# Patient Record
Sex: Male | Born: 1975
Health system: Southern US, Community
[De-identification: ages and names within clinical notes are randomized; demographics above are authoritative.]

## PROBLEM LIST (undated history)

## (undated) DIAGNOSIS — G4730 Sleep apnea, unspecified: Secondary | ICD-10-CM

## (undated) DIAGNOSIS — G4763 Sleep related bruxism: Secondary | ICD-10-CM

## (undated) DIAGNOSIS — J302 Other seasonal allergic rhinitis: Secondary | ICD-10-CM

## (undated) DIAGNOSIS — K219 Gastro-esophageal reflux disease without esophagitis: Secondary | ICD-10-CM

## (undated) DIAGNOSIS — F419 Anxiety disorder, unspecified: Secondary | ICD-10-CM

## (undated) HISTORY — DX: Sleep apnea, unspecified: G47.30

## (undated) HISTORY — DX: Sleep related bruxism: G47.63

## (undated) HISTORY — DX: Other seasonal allergic rhinitis: J30.2

## (undated) HISTORY — DX: Gastro-esophageal reflux disease without esophagitis: K21.9

---

## 2002-03-25 ENCOUNTER — Emergency Department (HOSPITAL_COMMUNITY): Admission: EM | Admit: 2002-03-25 | Discharge: 2002-03-25 | Payer: Self-pay | Admitting: Emergency Medicine

## 2002-04-05 ENCOUNTER — Encounter: Payer: Self-pay | Admitting: Emergency Medicine

## 2002-04-05 ENCOUNTER — Emergency Department (HOSPITAL_COMMUNITY): Admission: EM | Admit: 2002-04-05 | Discharge: 2002-04-05 | Payer: Self-pay | Admitting: Emergency Medicine

## 2002-06-28 ENCOUNTER — Emergency Department (HOSPITAL_COMMUNITY): Admission: EM | Admit: 2002-06-28 | Discharge: 2002-06-28 | Payer: Self-pay | Admitting: Emergency Medicine

## 2002-06-29 ENCOUNTER — Encounter: Payer: Self-pay | Admitting: Emergency Medicine

## 2002-07-03 ENCOUNTER — Ambulatory Visit (HOSPITAL_COMMUNITY): Admission: RE | Admit: 2002-07-03 | Discharge: 2002-07-03 | Payer: Self-pay | Admitting: *Deleted

## 2002-07-03 ENCOUNTER — Encounter: Payer: Self-pay | Admitting: Emergency Medicine

## 2006-11-11 ENCOUNTER — Ambulatory Visit: Payer: Self-pay | Admitting: Pulmonary Disease

## 2006-12-04 ENCOUNTER — Ambulatory Visit (HOSPITAL_BASED_OUTPATIENT_CLINIC_OR_DEPARTMENT_OTHER): Admission: RE | Admit: 2006-12-04 | Discharge: 2006-12-04 | Payer: Self-pay | Admitting: Pulmonary Disease

## 2006-12-06 ENCOUNTER — Ambulatory Visit: Payer: Self-pay | Admitting: Pulmonary Disease

## 2006-12-20 ENCOUNTER — Ambulatory Visit: Payer: Self-pay | Admitting: Pulmonary Disease

## 2013-08-22 ENCOUNTER — Emergency Department (HOSPITAL_BASED_OUTPATIENT_CLINIC_OR_DEPARTMENT_OTHER)
Admission: EM | Admit: 2013-08-22 | Discharge: 2013-08-22 | Disposition: A | Discharge status: Home / Self Care | Payer: 59 | Attending: Emergency Medicine | Admitting: Emergency Medicine

## 2013-08-22 ENCOUNTER — Encounter (HOSPITAL_BASED_OUTPATIENT_CLINIC_OR_DEPARTMENT_OTHER): Payer: Self-pay | Admitting: Emergency Medicine

## 2013-08-22 DIAGNOSIS — B349 Viral infection, unspecified: Secondary | ICD-10-CM

## 2013-08-22 DIAGNOSIS — B9789 Other viral agents as the cause of diseases classified elsewhere: Secondary | ICD-10-CM | POA: Insufficient documentation

## 2013-08-22 DIAGNOSIS — Z880 Allergy status to penicillin: Secondary | ICD-10-CM | POA: Insufficient documentation

## 2013-08-22 DIAGNOSIS — Z79899 Other long term (current) drug therapy: Secondary | ICD-10-CM | POA: Insufficient documentation

## 2013-08-22 DIAGNOSIS — R1084 Generalized abdominal pain: Secondary | ICD-10-CM | POA: Insufficient documentation

## 2013-08-22 DIAGNOSIS — R197 Diarrhea, unspecified: Secondary | ICD-10-CM | POA: Insufficient documentation

## 2013-08-22 MED ORDER — DIPHENHYDRAMINE HCL 50 MG/ML IJ SOLN
INTRAMUSCULAR | Status: AC
  Filled 2013-08-22: qty 1

## 2013-08-22 MED ORDER — DIPHENHYDRAMINE HCL 50 MG/ML IJ SOLN
25.0000 mg | Freq: Once | INTRAMUSCULAR | Status: AC
  Administered 2013-08-22: 25 mg via INTRAMUSCULAR

## 2013-08-22 MED ORDER — PROMETHAZINE HCL 25 MG PO TABS: 25.0000 mg | ORAL_TABLET | Freq: Four times a day (QID) | ORAL | Status: DC | PRN

## 2013-08-22 MED ORDER — DIPHENOXYLATE-ATROPINE 2.5-0.025 MG PO TABS: 1.0000 | ORAL_TABLET | Freq: Four times a day (QID) | ORAL | Status: DC | PRN

## 2013-08-22 MED ORDER — PROMETHAZINE HCL 25 MG RE SUPP: 25.0000 mg | Freq: Four times a day (QID) | RECTAL | Status: DC | PRN

## 2013-08-22 MED ORDER — DIPHENOXYLATE-ATROPINE 2.5-0.025 MG PO TABS
2.0000 | ORAL_TABLET | Freq: Once | ORAL | Status: AC
  Administered 2013-08-22: 2 via ORAL
  Filled 2013-08-22: qty 2

## 2013-08-22 MED ORDER — ONDANSETRON 4 MG PO TBDP
4.0000 mg | ORAL_TABLET | Freq: Once | ORAL | Status: AC
  Administered 2013-08-22: 4 mg via ORAL
  Filled 2013-08-22: qty 1

## 2013-08-22 MED ORDER — PROMETHAZINE HCL 25 MG/ML IJ SOLN
25.0000 mg | Freq: Once | INTRAMUSCULAR | Status: AC
  Administered 2013-08-22: 25 mg via INTRAMUSCULAR
  Filled 2013-08-22: qty 1

## 2013-08-22 NOTE — ED Provider Notes (Signed)
CSN: [id number]     Arrival date & time 08/22/13  1926 History   First MD Initiated Contact with Patient 08/22/13 2001     Chief Complaint  Patient presents with  . Emesis   (Consider location/radiation/quality/duration/timing/severity/associated sxs/prior Treatment) Patient is a 37 y.o. male presenting with vomiting and diarrhea. The history is provided by the patient. No language interpreter was used.  Emesis Severity:  Moderate Duration:  2 hours Timing:  Intermittent Number of daily episodes:  4 Quality:  Stomach contents How soon after eating does vomiting occur:  10 minutes Progression:  Unchanged Chronicity:  New Recent urination:  Normal Context: not post-tussive and not self-induced   Relieved by:  Nothing Worsened by:  Nothing tried Ineffective treatments:  None tried Associated symptoms: abdominal pain and diarrhea   Associated symptoms: no arthralgias and no chills   Abdominal pain:    Location:  Generalized   Quality:  Cramping   Severity:  Moderate   Onset quality:  Gradual   Duration:  2 hours   Timing:  Constant   Progression:  Unchanged   Chronicity:  New Diarrhea:    Quality:  Watery   Number of occurrences:  2   Severity:  Moderate   Duration:  2 hours   Timing:  Intermittent   Progression:  Unchanged Risk factors: no alcohol use, no diabetes, no sick contacts, no suspect food intake and no travel to endemic areas   Diarrhea Associated symptoms: abdominal pain and vomiting   Associated symptoms: no arthralgias, no chills and no fever     History reviewed. No pertinent past medical history. History reviewed. No pertinent past surgical history. No family history on file. History  Substance Use Topics  . Smoking status: Never Smoker   . Smokeless tobacco: Not on file  . Alcohol Use: No    Review of Systems  Constitutional: Negative for fever, chills and fatigue.  HENT: Negative for trouble swallowing.   Eyes: Negative for visual  disturbance.  Respiratory: Negative for shortness of breath.   Cardiovascular: Negative for chest pain and palpitations.  Gastrointestinal: Positive for nausea, vomiting, abdominal pain and diarrhea.  Genitourinary: Negative for dysuria and difficulty urinating.  Musculoskeletal: Negative for arthralgias and neck pain.  Skin: Negative for color change.  Neurological: Negative for dizziness and weakness.  Psychiatric/Behavioral: Negative for dysphoric mood.    Allergies  Penicillins  Home Medications   Current Outpatient Rx  Name  Route  Sig  Dispense  Refill  . modafinil (PROVIGIL) 200 MG tablet   Oral   Take 200 mg by mouth daily.          BP 152/87  Pulse 95  Temp(Src) 98.1 F (36.7 C) (Oral)  Resp 18  Ht 5\' 6"  (1.676 m)  Wt 240 lb (108.863 kg)  BMI 38.76 kg/m2  SpO2 98% Physical Exam  Nursing note and vitals reviewed. Constitutional: He is oriented to person, place, and time. He appears well-developed and well-nourished. No distress.  HENT:  Head: Normocephalic and atraumatic.  Eyes: Conjunctivae and EOM are normal.  Neck: Normal range of motion.  Cardiovascular: Normal rate and regular rhythm.  Exam reveals no gallop and no friction rub.   No murmur heard. Pulmonary/Chest: Effort normal and breath sounds normal. He has no wheezes. He has no rales. He exhibits no tenderness.  Abdominal: Soft. He exhibits no distension. There is no tenderness. There is no rebound and no guarding.  Musculoskeletal: Normal range of motion.  Neurological: He is  alert and oriented to person, place, and time. Coordination normal.  Speech is goal-oriented. Moves limbs without ataxia.   Skin: Skin is warm and dry.  Psychiatric: He has a normal mood and affect. His behavior is normal.    ED Course  Procedures (including critical care time) Labs Review Labs Reviewed - No data to display Imaging Review No results found.  EKG Interpretation   None       MDM   1. Viral illness      9:38 PM Patient was given ODT zofran on arrival and now experiencing hives. Patient will have IM benadryl, IM phenergan and lomotil. Patient does not want to have an IV. Patient will be discharged with symptomatic relief. Vitals stable and patient afebrile. Patient denies throat closing or SOB.    Emilia Beck, PA-C 08/23/13 2351

## 2013-08-22 NOTE — ED Notes (Signed)
Pt c/o sudden onset of vomiting and diarrhea apprx. 1 hour ago. Pt reports vomited x4 and diarrhea x2.

## 2013-08-24 NOTE — ED Provider Notes (Signed)
Medical screening examination/treatment/procedure(s) were performed by non-physician practitioner and as supervising physician I was immediately available for consultation/collaboration.  EKG Interpretation   None         Dagmar Hait, MD 08/24/13 [phone]

## 2013-11-11 ENCOUNTER — Ambulatory Visit (INDEPENDENT_AMBULATORY_CARE_PROVIDER_SITE_OTHER): Payer: 59 | Admitting: Pulmonary Disease

## 2013-11-11 ENCOUNTER — Encounter: Payer: Self-pay | Admitting: Pulmonary Disease

## 2013-11-11 ENCOUNTER — Encounter (INDEPENDENT_AMBULATORY_CARE_PROVIDER_SITE_OTHER): Payer: Self-pay

## 2013-11-11 VITALS — BP 124/82 | HR 87 | Temp 98.2°F | Ht 66.5 in | Wt 264.2 lb

## 2013-11-11 DIAGNOSIS — G4733 Obstructive sleep apnea (adult) (pediatric): Secondary | ICD-10-CM

## 2013-11-11 NOTE — Progress Notes (Signed)
Subjective:    Patient ID: Bruce Simmons, male    DOB: 09-06-1975, 38 y.o.   MRN: [id number]  HPI The patient is a 38 year old male who comes in today as a self-referral for management of obstructive sleep apnea. I have seen him in the distant past, and he has known very severe OSA, with an AHI 115 events per hour in 2008. The patient was started on CPAP with definite improvement in his sleep and daytime alertness, but his alertness during the day never normalized. The patient never followed up with me over time, but was started on Provigil by his primary physician which worked very well with the CPAP. He has not kept up with his mask changes or supplies, and has not been able to wear CPAP for quite some time because of this. He is also gained 40 pounds since his initial study in 2008. He is noting nonrestorative sleep with frequent awakenings, and severe sleepiness during the day even with Provigil. His Epworth score today is 15.   Sleep Questionnaire What time do you typically go to bed?( Between what hours) 9-10pm 9-10pm at 1202 on 11/11/13 by Maurice March, RN How long does it take you to fall asleep? 10-30 minutes 10-30 minutes at 1202 on 11/11/13 by Maurice March, RN How many times during the night do you wake up? 4 4 at 1202 on 11/11/13 by Maurice March, RN What time do you get out of bed to start your day? 0530 0530 at 1202 on 11/11/13 by Maurice March, RN Do you drive or operate heavy machinery in your occupation? Yes Yes at 1202 on 11/11/13 by Maurice March, RN How much has your weight changed (up or down) over the past two years? (In pounds) 20 lb (9.072 kg) 20 lb (9.072 kg) at 1202 on 11/11/13 by Maurice March, RN Have you ever had a sleep study before? Yes Yes at 1202 on 11/11/13 by Maurice March, RN If yes, location of study? [facility] at 1202 on 11/11/13 by Maurice March, RN If yes, date of study? 2008 2008 at 1202 on 11/11/13 by Maurice March, RN  Do you currently use CPAP? YesYes intermittenly at 1202 on 11/11/13 by Maurice March, RN If so, what pressure? 17 17 at 1202 on 11/11/13 by Maurice March, RN Do you wear oxygen at any time? No No at 1202 on 11/11/13 by Maurice March, RN   Review of Systems  Constitutional: Negative for fever and unexpected weight change.  HENT: Negative for congestion, dental problem, ear pain, nosebleeds, postnasal drip, rhinorrhea, sinus pressure, sneezing, sore throat and trouble swallowing.   Eyes: Negative for redness and itching.  Respiratory: Negative for cough, chest tightness, shortness of breath and wheezing.   Cardiovascular: Positive for palpitations and leg swelling.  Gastrointestinal: Negative for nausea and vomiting.  Genitourinary: Negative for dysuria.  Musculoskeletal: Negative for joint swelling.  Skin: Negative for rash.  Neurological: Negative for headaches.  Hematological: Does not bruise/bleed easily.  Psychiatric/Behavioral: Negative for dysphoric mood. The patient is not nervous/anxious.        Objective:   Physical Exam Constitutional:  Obese male, no acute distress  HENT:  Nares patent without discharge  Oropharynx without exudate, palate and uvula are elongated, mild tonsillar hypertrophy  Eyes:  Perrla, eomi, no scleral icterus  Neck:  No JVD, no TMG  Cardiovascular:  Normal rate, regular rhythm, no rubs or gallops.  No  murmurs        Intact distal pulses  Pulmonary :  Normal breath sounds, no stridor or respiratory distress   No rales, rhonchi, or wheezing  Abdominal:  Soft, nondistended, bowel sounds present.  No tenderness noted.   Musculoskeletal:  slight lower extremity edema noted.  Lymph Nodes:  No cervical lymphadenopathy noted  Skin:  No cyanosis noted  Neurologic:  Appears sleepy but appropriate, moves all 4 extremities without obvious deficit.         Assessment & Plan:

## 2013-11-11 NOTE — Assessment & Plan Note (Signed)
The patient has very severe obstructive sleep apnea in the past, and has gained another 40 pounds of weight. He currently is not able to use CPAP because of a mask that is falling apart, and old supplies. At this point, we need to make sure that his machine is working properly, and will also get him new supplies.  We will also need to optimize his pressure again on an automatic device since he has gained weight. I have pulled the patient that he is probably in the subset of patients that have persistent sleepiness even with adequate CPAP usage. We normally treat these patients with stimulant medication such as Provigil, but I also explained this usually resolves with weight loss. The patient has had very high pressure needs, and I've explained that we are reaching the limit of any positive pressure device. Therefore, I stressed to him the importance of aggressive weight loss.

## 2013-11-11 NOTE — Patient Instructions (Signed)
Will have your machine checked to make sure is in working order. Will get the auto device for another 2-3 weeks to re-optimize your pressure. Need new mask and supplies. Work on weight loss.  Will call you once I receive your download on the auto, and will also arrange a followup visit.

## 2013-11-12 ENCOUNTER — Other Ambulatory Visit: Payer: Self-pay | Admitting: Occupational Medicine

## 2013-11-12 ENCOUNTER — Ambulatory Visit: Payer: Self-pay

## 2013-11-12 DIAGNOSIS — R52 Pain, unspecified: Secondary | ICD-10-CM

## 2013-11-17 ENCOUNTER — Telehealth: Payer: Self-pay | Admitting: Pulmonary Disease

## 2013-11-17 NOTE — Telephone Encounter (Signed)
lmtcb x1 for pt. 

## 2013-11-18 NOTE — Telephone Encounter (Signed)
Pt returning call.Bruce Simmons ° °

## 2013-11-18 NOTE — Telephone Encounter (Signed)
Per order sent by [facility] at the 3.11.15 ov: Comments     Needs to re-establish with DME. Wants someone in [place] 1) check his cpap; machine with manometer, and make sure working properly. 2) will use auto 5-22cm for 2-3 weeks, with download to me 3) needs to be fitted for new full face mask and supplies. Show him different kinds. See if this can be done ASAP.   Called spoke with patient to inform him that [facility] has been trying to get in touch with him regarding his CPAP machine as discussed with [place] at the last ov.  Pt stated that he was aware, has been trying to contact them as well and actually spoke with [facility] earlier this afternoon and has an appt with them.  Called [facility] spoke with Corene Cornea and verified that pt has an upcoming appt on 3.20.15 for CPAP check and mask fitting.  Nothing further needed at this time; will sign off.

## 2013-11-18 NOTE — Telephone Encounter (Signed)
lmomtcb for pt  Corene Cornea with [facility] is aware we have also atc pt and have lmomtcb

## 2013-11-26 ENCOUNTER — Ambulatory Visit: Payer: PRIVATE HEALTH INSURANCE | Admitting: Physical Therapy

## 2014-01-01 ENCOUNTER — Telehealth: Payer: Self-pay | Admitting: Pulmonary Disease

## 2014-01-01 NOTE — Telephone Encounter (Signed)
Byron directed me to an accordion folder that [place] keeps all downloads and I do not see it in there.  I'm not sure if she would keep it anywhere else.

## 2014-01-01 NOTE — Telephone Encounter (Signed)
Called and spoke with [facility] and they are faxing the download to the fax machine up front.  i have called and lmom to make the pt aware.  Will forward back to [place] to keep a look out for the download to come through.  thanks

## 2014-01-01 NOTE — Telephone Encounter (Signed)
Caryl Pina can you see if [facility] received the download for this pt.  thanks

## 2014-01-08 NOTE — Telephone Encounter (Signed)
Anderson Malta have your received these in [facility] papers?  Thanks.

## 2014-01-12 NOTE — Telephone Encounter (Signed)
I looked through all paperwork and do not see this download. I called [facility] again and asked that they fax to triage fax to my attention. [place] Bing, CMA

## 2014-01-12 NOTE — Telephone Encounter (Signed)
I received the download and placed in [facility] green look-at folder to review. [place] Bing, CMA

## 2014-01-14 NOTE — Telephone Encounter (Signed)
I asked for the to be put on auto 5-22 for 2-3 weeks with download to me.  The download they sent is on a fixed pressure of 17!!  What gives?  Let pt know the dme screwed up, and then call dme and ask them to do what the order says!!

## 2014-01-14 NOTE — Telephone Encounter (Signed)
Spoke with pt and advised that we are checking with [facility] to see why download was done on fixed 17 and not auto 5-22.  Advised pt that we will contact as soon as we hear back from [facility].  Await call from [facility] at [facility]

## 2014-01-14 NOTE — Telephone Encounter (Signed)
Spoke with Melissa from [facility].  She is going to look into this and call us back.  (download is in triage) [place] x 1 for pt to update him

## 2014-01-15 NOTE — Telephone Encounter (Signed)
Received call from [facility] with [facility]. Melissa stated she was unsure why the data collected was for a fixed pressure but is going to re-due the auto for two weeks and send in the download when correct data has been collected. Nothing further needed at this time.

## 2014-01-15 NOTE — Telephone Encounter (Signed)
[facility] did do pt's auto-titration, but are unable to retrieve the results.  [facility] will complete another auto-titration & get results in ASAP.  Satira Anis

## 2014-01-15 NOTE — Telephone Encounter (Signed)
lmomtcb x1 for melissa 

## 2014-02-02 ENCOUNTER — Ambulatory Visit: Payer: PRIVATE HEALTH INSURANCE | Attending: Physician Assistant | Admitting: Physical Therapy

## 2014-02-02 DIAGNOSIS — M545 Low back pain, unspecified: Secondary | ICD-10-CM | POA: Insufficient documentation

## 2014-02-02 DIAGNOSIS — IMO0001 Reserved for inherently not codable concepts without codable children: Secondary | ICD-10-CM | POA: Diagnosis present

## 2014-02-04 ENCOUNTER — Ambulatory Visit: Payer: PRIVATE HEALTH INSURANCE | Admitting: Physical Therapy

## 2014-02-04 DIAGNOSIS — IMO0001 Reserved for inherently not codable concepts without codable children: Secondary | ICD-10-CM | POA: Diagnosis not present

## 2014-02-10 ENCOUNTER — Ambulatory Visit: Payer: PRIVATE HEALTH INSURANCE

## 2014-02-10 DIAGNOSIS — IMO0001 Reserved for inherently not codable concepts without codable children: Secondary | ICD-10-CM | POA: Diagnosis not present

## 2014-02-15 ENCOUNTER — Ambulatory Visit: Payer: PRIVATE HEALTH INSURANCE | Admitting: Rehabilitation

## 2014-02-23 ENCOUNTER — Encounter: Payer: PRIVATE HEALTH INSURANCE | Admitting: Physical Therapy

## 2014-02-25 ENCOUNTER — Ambulatory Visit: Payer: PRIVATE HEALTH INSURANCE | Admitting: Rehabilitation

## 2014-02-25 DIAGNOSIS — IMO0001 Reserved for inherently not codable concepts without codable children: Secondary | ICD-10-CM | POA: Diagnosis not present

## 2014-03-05 ENCOUNTER — Encounter (HOSPITAL_COMMUNITY): Payer: Self-pay | Admitting: Emergency Medicine

## 2014-03-05 ENCOUNTER — Emergency Department (HOSPITAL_COMMUNITY)
Admission: EM | Admit: 2014-03-05 | Discharge: 2014-03-05 | Disposition: A | Discharge status: Home / Self Care | Payer: 59 | Source: Home / Self Care | Attending: Family Medicine | Admitting: Family Medicine

## 2014-03-05 DIAGNOSIS — L259 Unspecified contact dermatitis, unspecified cause: Secondary | ICD-10-CM

## 2014-03-05 DIAGNOSIS — L309 Dermatitis, unspecified: Secondary | ICD-10-CM

## 2014-03-05 LAB — GLUCOSE, CAPILLARY: GLUCOSE-CAPILLARY: 103 mg/dL — AB (ref 70–99)

## 2014-03-05 MED ORDER — TERBINAFINE HCL 250 MG PO TABS: 250.0000 mg | ORAL_TABLET | Freq: Every day | ORAL | Status: DC

## 2014-03-05 MED ORDER — PREDNISONE 10 MG PO TABS: ORAL_TABLET | ORAL | Status: DC

## 2014-03-05 NOTE — ED Notes (Signed)
Pt  Has  A  Rash  Behind  Both legs     That  He  Noticed perhaps  1  Week  Ago  - he  denys  Any  Known  Causative  Agents        He  Appears  In no  Acute  Distress      Takes  No  meds

## 2014-03-05 NOTE — Discharge Instructions (Signed)
Eczema Eczema, also called atopic dermatitis, is a skin disorder that causes inflammation of the skin. It causes a red rash and dry, scaly skin. The skin becomes very itchy. Eczema is generally worse during the cooler winter months and often improves with the warmth of summer. Eczema usually starts showing signs in infancy. Some children outgrow eczema, but it may last through adulthood.  CAUSES  The exact cause of eczema is not known, but it appears to run in families. People with eczema often have a family history of eczema, allergies, asthma, or hay fever. Eczema is not contagious. Flare-ups of the condition may be caused by:   Contact with something you are sensitive or allergic to.   Stress. SIGNS AND SYMPTOMS  Dry, scaly skin.   Red, itchy rash.   Itchiness. This may occur before the skin rash and may be very intense.  DIAGNOSIS  The diagnosis of eczema is usually made based on symptoms and medical history. TREATMENT  Eczema cannot be cured, but symptoms usually can be controlled with treatment and other strategies. A treatment plan might include:  Controlling the itching and scratching.   Use over-the-counter antihistamines as directed for itching. This is especially useful at night when the itching tends to be worse.   Use over-the-counter steroid creams as directed for itching.   Avoid scratching. Scratching makes the rash and itching worse. It may also result in a skin infection (impetigo) due to a break in the skin caused by scratching.   Keeping the skin well moisturized with creams every day. This will seal in moisture and help prevent dryness. Lotions that contain alcohol and water should be avoided because they can dry the skin.   Limiting exposure to things that you are sensitive or allergic to (allergens).   Recognizing situations that cause stress.   Developing a plan to manage stress.  HOME CARE INSTRUCTIONS   Only take over-the-counter or  prescription medicines as directed by your health care provider.   Do not use anything on the skin without checking with your health care provider.   Keep baths or showers short (5 minutes) in warm (not hot) water. Use mild cleansers for bathing. These should be unscented. You may add nonperfumed bath oil to the bath water. It is best to avoid soap and bubble bath.   Immediately after a bath or shower, when the skin is still damp, apply a moisturizing ointment to the entire body. This ointment should be a petroleum ointment. This will seal in moisture and help prevent dryness. The thicker the ointment, the better. These should be unscented.   Keep fingernails cut short. Children with eczema may need to wear soft gloves or mittens at night after applying an ointment.   Dress in clothes made of cotton or cotton blends. Dress lightly, because heat increases itching.   A child with eczema should stay away from anyone with fever blisters or cold sores. The virus that causes fever blisters (herpes simplex) can cause a serious skin infection in children with eczema. SEEK MEDICAL CARE IF:   Your itching interferes with sleep.   Your rash gets worse or is not better within 1 week after starting treatment.   You see pus or soft yellow scabs in the rash area.   You have a fever.   You have a rash flare-up after contact with someone who has fever blisters.  Document Released: 08/17/2000 Document Revised: 06/10/2013 Document Reviewed: 03/23/2013 ExitCare Patient Information 2015 [employer]. This information   is not intended to replace advice given to you by your health care provider. Make sure you discuss any questions you have with your health care provider.  

## 2014-03-05 NOTE — ED Provider Notes (Signed)
Medical screening examination/treatment/procedure(s) were performed by resident physician or non-physician practitioner and as supervising physician I was immediately available for consultation/collaboration.   Pauline Good MD.   Billy Fischer, MD 03/05/14 [phone]

## 2014-03-05 NOTE — ED Provider Notes (Signed)
CSN: [id number]     Arrival date & time 03/05/14  1024 History   First MD Initiated Contact with Patient 03/05/14 1041     No chief complaint on file.  (Consider location/radiation/quality/duration/timing/severity/associated sxs/prior Treatment) Patient is a 38 y.o. male presenting with rash. The history is provided by the patient.  Rash Location:  Full body Quality: itchiness   Severity:  Mild Onset quality:  Gradual Timing:  Constant Progression:  Worsening Chronicity:  New Context: not sick contacts   Relieved by:  Nothing Worsened by:  Nothing tried Ineffective treatments:  None tried Associated symptoms: no abdominal pain     Past Medical History  Diagnosis Date  . Sleep apnea   . Seasonal allergies    No past surgical history on file. Family History  Problem Relation Age of Onset  . Diabetes Father   . Hypertension Father   . Hyperlipidemia Father   . Atrial fibrillation Father   . Stroke Mother   . Hypertension Mother   . Thyroid cancer Mother    History  Substance Use Topics  . Smoking status: Former Smoker -- 0.25 packs/day for 3 years    Types: Cigarettes    Quit date: 09/04/2003  . Smokeless tobacco: Not on file  . Alcohol Use: Yes     Comment: 12 pack/week    Review of Systems  Gastrointestinal: Negative for abdominal pain.  Skin: Positive for rash.  All other systems reviewed and are negative.   Allergies  Penicillins and Zofran  Home Medications   Prior to Admission medications   Medication Sig Start Date End Date Taking? Authorizing Provider  modafinil (PROVIGIL) 200 MG tablet Take 200 mg by mouth daily.    Historical Provider, MD  omeprazole (PRILOSEC) 20 MG capsule Take 20 mg by mouth daily as needed.    Historical Provider, MD   BP 133/76  Pulse 88  Temp(Src) 99 F (37.2 C) (Oral)  Resp 16  SpO2 100% Physical Exam  Nursing note and vitals reviewed. Constitutional: He appears well-developed and well-nourished.  HENT:  Head:  Normocephalic.  Abdominal: He exhibits no distension.  Musculoskeletal: Normal range of motion.  Skin: Rash noted. There is erythema.  Red raised areas arms and legs,  Look fungal,   Chest  eczema  Psychiatric: He has a normal mood and affect.    ED Course  Procedures (including critical care time) Labs Review Labs Reviewed - No data to display  Imaging Review No results found.   MDM  No diagnosis found. Pt counseled advised to follow up with Dr. Rolm Bookbinder for derm evaluation   Fransico Meadow, PA-C 03/05/14 1100

## 2014-03-15 ENCOUNTER — Encounter: Payer: Self-pay | Admitting: Rehabilitation

## 2014-03-15 ENCOUNTER — Ambulatory Visit: Payer: PRIVATE HEALTH INSURANCE | Attending: Physician Assistant | Admitting: Physical Therapy

## 2014-03-15 DIAGNOSIS — IMO0001 Reserved for inherently not codable concepts without codable children: Secondary | ICD-10-CM | POA: Insufficient documentation

## 2014-03-15 DIAGNOSIS — M545 Low back pain, unspecified: Secondary | ICD-10-CM | POA: Insufficient documentation

## 2014-03-18 ENCOUNTER — Encounter: Payer: PRIVATE HEALTH INSURANCE | Admitting: Rehabilitation

## 2014-03-22 ENCOUNTER — Encounter: Payer: PRIVATE HEALTH INSURANCE | Admitting: Physical Therapy

## 2014-03-23 ENCOUNTER — Telehealth: Payer: Self-pay | Admitting: Pulmonary Disease

## 2014-03-23 NOTE — Telephone Encounter (Signed)
My Chart Message Copied Below:    Appointment Request From: Alfred Levins  With Provider: Kathee Delton, MD [facility]]  Preferred Date Range: From 03/15/2014 To 03/19/2014  Preferred Times: Monday Morning, Tuesday Morning, Thursday Morning  Reason: To address the following health maintenance concerns. Tetanus/Tdap  Comments:

## 2014-03-23 NOTE — Telephone Encounter (Signed)
I called spoke with pt. Made him aware those vaccines needs to come form PCP. Nothing further needed

## 2014-03-25 ENCOUNTER — Encounter: Payer: PRIVATE HEALTH INSURANCE | Admitting: Rehabilitation

## 2014-04-12 ENCOUNTER — Telehealth: Payer: Self-pay | Admitting: Pulmonary Disease

## 2014-04-12 NOTE — Telephone Encounter (Signed)
Called and spoke with pt and he is requesting the results of the titration study.  He stated that the first one that he did was not readable and he stated that he did it again for 2 weeks and has not heard anything.  [place] please advise. thanks

## 2014-04-13 NOTE — Telephone Encounter (Signed)
Is this an auto titration on cpap.  If so, need download from dme or from [place].

## 2014-04-13 NOTE — Telephone Encounter (Signed)
I have sent Melissa a staff message letting her know download is needed off machine and to fax to triage.  Pt is aware we are awaiting download.

## 2014-04-14 NOTE — Telephone Encounter (Signed)
I have sent another staff message to [facility] checking on this.

## 2014-04-14 NOTE — Telephone Encounter (Signed)
Download received and placed in KC's look. Please advise thanks

## 2014-04-14 NOTE — Telephone Encounter (Signed)
Corene Cornea w/ [facility] calling back.  Requesting call back.  [phone]

## 2014-04-14 NOTE — Telephone Encounter (Signed)
Called pt. Made him aware we are still working in this. He reports his machine is not capable of auto. Called [place] w/ [facility]. Pt was giving a loaner auto CPAP machine. When they tried to get the 1st auto download results it would not download anything. Pt was then given another loaner in May. Pt returned device 03/12/14. He is going to check on this and see where this download is. Will await call back

## 2014-04-14 NOTE — Telephone Encounter (Signed)
Bruce Simmons w/ [facility] states that per their respiratory support department, a download from 01/27/14 to 03/12/14 obtained no information.  Will need to repeat this procedure.  Unsure if a new order to start this will be needed.  Bruce Simmons will call back if able to start w/out the new order.  Bruce Simmons can be reached at [phone].  Satira Anis

## 2014-04-14 NOTE — Telephone Encounter (Signed)
Per Corene Cornea, there is no data on card from CPAP Titration done 01/27/14-03/12/14 Test will need to be repeated--verbal order given--they will use old order to process.  Pt aware that [facility] will be contacting him to set this up.  Pt is going to contact [facility] billing dept to inquire if there are any additional costs for this repeat as the repeat if not his fault.   Will send to Dr Gwenette Greet as Juluis Rainier.

## 2014-04-14 NOTE — Telephone Encounter (Signed)
I ordered an auto titration device for 2-3 weeks back in march to optimize his pressure.  Never got this information. The current download on my desk is on a fixed pressure.  They either need to produce the 2-3 week download on auto from march, or we will have to start over again.  See my orders from march visit

## 2014-04-14 NOTE — Telephone Encounter (Signed)
lmtcb x1 

## 2014-11-13 ENCOUNTER — Telehealth: Payer: Self-pay | Admitting: Nurse Practitioner

## 2014-11-13 DIAGNOSIS — H109 Unspecified conjunctivitis: Secondary | ICD-10-CM

## 2014-11-13 NOTE — Progress Notes (Signed)
Continue curent erythromycin drops- should start improving  Today- if worsen by tomorrow resubmit e visit.

## 2014-11-19 ENCOUNTER — Ambulatory Visit: Payer: Self-pay | Admitting: Pulmonary Disease

## 2015-02-07 ENCOUNTER — Telehealth: Payer: Self-pay | Admitting: Pulmonary Disease

## 2015-02-07 NOTE — Telephone Encounter (Signed)
Went out to speak with Bruce Simmons. Per [name] he received a call from [facility] in regards to the order for his CPAP supplies he was here requesting. He was scheduled for a f/u appt. Nothing further needed at this time.

## 2015-04-07 ENCOUNTER — Telehealth: Payer: Self-pay | Admitting: Pulmonary Disease

## 2015-04-07 NOTE — Telephone Encounter (Signed)
Spoke with Bruce Simmons at [facility], states she has a Banker from 02/16/15 on pt that she is faxing to our office.  Verified fax #.  Nothing further needed at this time.

## 2015-04-08 ENCOUNTER — Ambulatory Visit (INDEPENDENT_AMBULATORY_CARE_PROVIDER_SITE_OTHER): Payer: 59 | Admitting: Pulmonary Disease

## 2015-04-08 ENCOUNTER — Encounter: Payer: Self-pay | Admitting: Pulmonary Disease

## 2015-04-08 VITALS — BP 124/66 | HR 65 | Temp 97.8°F | Ht 66.0 in | Wt 247.0 lb

## 2015-04-08 DIAGNOSIS — G4733 Obstructive sleep apnea (adult) (pediatric): Secondary | ICD-10-CM

## 2015-04-08 DIAGNOSIS — Z9989 Dependence on other enabling machines and devices: Principal | ICD-10-CM

## 2015-04-08 NOTE — Patient Instructions (Signed)
Will arrange for new CPAP machine Follow up in 1 year 

## 2015-04-08 NOTE — Progress Notes (Signed)
Chief Complaint  Patient presents with  . Follow-up    former [facility] pt seen 11/11/13. Pt reports he has a very old machine and would like order sent to [facility] for new machine. He reports he wears CPAP nightly. C/o dry mouth, has full face mask (? switch mask). He does sleep with mouth open. His machine is not able to pull compliance data off.    History of Present Illness: Bruce Simmons is a 39 y.o. male with OSA.  He was previously seen by Dr. Gwenette Greet.  He has been on CPAP since 2008.  He does not feel like his CPAP is delivering enough pressure anymore.  He is getting mouth dryness.  He has full face mask.  He uses nuvigil when he works.  He works at [employer].  He wears mouth guard at night with CPAP to help with bruxism.    TESTS: PSG 2008 >> AHI 110, SpO2 low 51%  Past medical hx >> Allergies, GERD, Bruxism  Past surgical hx, Medications, Allergies, Family hx, Social hx all reviewed.   Physical Exam: BP 124/66 mmHg  Pulse 65  Temp(Src) 97.8 F (36.6 C) (Oral)  Ht 5\' 6"  (1.676 m)  Wt 247 lb (112.038 kg)  BMI 39.89 kg/m2  SpO2 98%  General - No distress ENT - No sinus tenderness, no oral exudate, no LAN, MP 4, scalloped tongue Cardiac - s1s2 regular, no murmur Chest - No wheeze/rales/dullness Back - No focal tenderness Abd - Soft, non-tender Ext - No edema Neuro - Normal strength Skin - No rashes Psych - normal mood, and behavior   Assessment/Plan:  Obstructive sleep apnea. Plan: - his CPAP is more than 40 yrs old >> will arrange for new auto CPAP range 10 to 20 cm H2O and get download  Bruxism. Plan: - re-assess after he gets new CPAP machine  Daytime hypersomnia. Plan: - continue nuvigil at work for now >> re-assess after he gets new CPAP machine  Obesity. Plan: - discussed importance of weight loss   Chesley Mires, MD [facility] Pager:  [phone]

## 2015-06-08 ENCOUNTER — Telehealth: Payer: Self-pay | Admitting: Pulmonary Disease

## 2015-06-08 NOTE — Telephone Encounter (Signed)
Per Melissa:  Patient has to have face to face within 30 days of set on when obtaining a replacement CPAP. Patient received a replacement CPAP machine and needs to be seen by 07/12/2015 for another face to face.  Patient scheduled to see Dr. Halford Chessman on 11/1 Patient notified. Nothing further needed.

## 2015-07-05 ENCOUNTER — Ambulatory Visit: Payer: Self-pay | Admitting: Pulmonary Disease

## 2015-09-13 DIAGNOSIS — G4733 Obstructive sleep apnea (adult) (pediatric): Secondary | ICD-10-CM | POA: Diagnosis not present

## 2015-11-30 MED FILL — MODAFINIL 200 MG TABLET: 200 | 30 days supply | Qty: 30 | Fill #0

## 2015-11-30 MED FILL — OMEPRAZOLE DR 20 MG CAPSULE: 20 | 90 days supply | Qty: 90 | Fill #0

## 2015-12-15 DIAGNOSIS — G4733 Obstructive sleep apnea (adult) (pediatric): Secondary | ICD-10-CM | POA: Diagnosis not present

## 2016-03-15 MED FILL — MODAFINIL 200 MG TABLET: 200 | 30 days supply | Qty: 30 | Fill #0

## 2016-03-15 MED FILL — OMEPRAZOLE DR 20 MG CAPSULE: 20 | 90 days supply | Qty: 90 | Fill #1

## 2016-05-30 DIAGNOSIS — G4733 Obstructive sleep apnea (adult) (pediatric): Secondary | ICD-10-CM | POA: Diagnosis not present

## 2016-06-27 DIAGNOSIS — G4733 Obstructive sleep apnea (adult) (pediatric): Secondary | ICD-10-CM | POA: Diagnosis not present

## 2016-07-30 DIAGNOSIS — N5201 Erectile dysfunction due to arterial insufficiency: Secondary | ICD-10-CM | POA: Diagnosis not present

## 2016-07-30 DIAGNOSIS — K219 Gastro-esophageal reflux disease without esophagitis: Secondary | ICD-10-CM | POA: Diagnosis not present

## 2016-07-30 DIAGNOSIS — Z0000 Encounter for general adult medical examination without abnormal findings: Secondary | ICD-10-CM | POA: Diagnosis not present

## 2016-07-30 DIAGNOSIS — Z23 Encounter for immunization: Secondary | ICD-10-CM | POA: Diagnosis not present

## 2016-07-30 DIAGNOSIS — G4733 Obstructive sleep apnea (adult) (pediatric): Secondary | ICD-10-CM | POA: Diagnosis not present

## 2016-07-30 DIAGNOSIS — Z6841 Body Mass Index (BMI) 40.0 and over, adult: Secondary | ICD-10-CM | POA: Diagnosis not present

## 2016-09-28 DIAGNOSIS — L300 Nummular dermatitis: Secondary | ICD-10-CM | POA: Diagnosis not present

## 2016-09-28 DIAGNOSIS — L209 Atopic dermatitis, unspecified: Secondary | ICD-10-CM | POA: Diagnosis not present

## 2016-10-04 DIAGNOSIS — G4733 Obstructive sleep apnea (adult) (pediatric): Secondary | ICD-10-CM | POA: Diagnosis not present

## 2016-10-25 MED FILL — CLOBETASOL 0.05% CREAM: 0.05 | 15 days supply | Qty: 60 | Fill #0

## 2017-01-24 DIAGNOSIS — G4733 Obstructive sleep apnea (adult) (pediatric): Secondary | ICD-10-CM | POA: Diagnosis not present

## 2017-09-06 DIAGNOSIS — G4733 Obstructive sleep apnea (adult) (pediatric): Secondary | ICD-10-CM | POA: Diagnosis not present

## 2017-09-06 MED FILL — CLOBETASOL PROPIONATE 0.05: 0.05 | 15 days supply | Qty: 60 | Fill #1

## 2017-12-12 DIAGNOSIS — G4733 Obstructive sleep apnea (adult) (pediatric): Secondary | ICD-10-CM | POA: Diagnosis not present

## 2017-12-27 DIAGNOSIS — G4733 Obstructive sleep apnea (adult) (pediatric): Secondary | ICD-10-CM | POA: Diagnosis not present

## 2017-12-27 DIAGNOSIS — K219 Gastro-esophageal reflux disease without esophagitis: Secondary | ICD-10-CM | POA: Diagnosis not present

## 2017-12-27 DIAGNOSIS — N5201 Erectile dysfunction due to arterial insufficiency: Secondary | ICD-10-CM | POA: Diagnosis not present

## 2017-12-27 DIAGNOSIS — F411 Generalized anxiety disorder: Secondary | ICD-10-CM | POA: Diagnosis not present

## 2017-12-27 DIAGNOSIS — Z6841 Body Mass Index (BMI) 40.0 and over, adult: Secondary | ICD-10-CM | POA: Diagnosis not present

## 2017-12-27 DIAGNOSIS — Z0000 Encounter for general adult medical examination without abnormal findings: Secondary | ICD-10-CM | POA: Diagnosis not present

## 2018-02-08 DIAGNOSIS — G4733 Obstructive sleep apnea (adult) (pediatric): Secondary | ICD-10-CM | POA: Diagnosis not present

## 2018-09-05 DIAGNOSIS — G4733 Obstructive sleep apnea (adult) (pediatric): Secondary | ICD-10-CM | POA: Diagnosis not present

## 2018-09-11 DIAGNOSIS — Z3009 Encounter for other general counseling and advice on contraception: Secondary | ICD-10-CM | POA: Diagnosis not present

## 2018-09-11 MED FILL — DIAZEPAM 10 MG TABS: 10 | 1 days supply | Qty: 2 | Fill #0

## 2018-10-30 DIAGNOSIS — Z302 Encounter for sterilization: Secondary | ICD-10-CM | POA: Diagnosis not present

## 2019-01-08 DIAGNOSIS — G4733 Obstructive sleep apnea (adult) (pediatric): Secondary | ICD-10-CM | POA: Diagnosis not present

## 2019-04-08 DIAGNOSIS — G4733 Obstructive sleep apnea (adult) (pediatric): Secondary | ICD-10-CM | POA: Diagnosis not present

## 2019-04-30 ENCOUNTER — Other Ambulatory Visit: Payer: Self-pay

## 2019-04-30 ENCOUNTER — Ambulatory Visit: Payer: Self-pay

## 2019-04-30 ENCOUNTER — Other Ambulatory Visit: Payer: Self-pay | Admitting: Occupational Medicine

## 2019-04-30 DIAGNOSIS — M25531 Pain in right wrist: Secondary | ICD-10-CM

## 2019-05-14 ENCOUNTER — Telehealth: Payer: 59 | Admitting: Family

## 2019-05-14 NOTE — Progress Notes (Signed)
Unfortunately, we are unable to treat children through e-visit. Please call your pediatrician and speak to the nurse on call.

## 2019-05-20 ENCOUNTER — Ambulatory Visit: Payer: PRIVATE HEALTH INSURANCE | Admitting: Occupational Therapy

## 2019-05-20 ENCOUNTER — Other Ambulatory Visit: Payer: Self-pay

## 2019-05-20 ENCOUNTER — Encounter: Payer: Self-pay | Admitting: Occupational Therapy

## 2019-05-20 ENCOUNTER — Ambulatory Visit: Payer: PRIVATE HEALTH INSURANCE | Attending: Occupational Medicine | Admitting: Occupational Therapy

## 2019-05-20 DIAGNOSIS — M25631 Stiffness of right wrist, not elsewhere classified: Secondary | ICD-10-CM | POA: Diagnosis present

## 2019-05-20 DIAGNOSIS — M6281 Muscle weakness (generalized): Secondary | ICD-10-CM | POA: Insufficient documentation

## 2019-05-20 DIAGNOSIS — M25531 Pain in right wrist: Secondary | ICD-10-CM | POA: Insufficient documentation

## 2019-05-20 NOTE — Patient Instructions (Addendum)
   AROM: Wrist Extension   .  With right palm down, bend wrist up.  Then bend down slowly as far as you can.  No sharp pain. Repeat __10__ times per set.  Do __4__ sessions per day.    AROM: Forearm Pronation / Supination   With  arm in handshake position, slowly rotate palm down until stretch is felt. Relax. Then rotate palm up until stretch is felt. Repeat _10___ times per set. Do _4___ sessions per day.          Radial / Ulnar Deviation (Assistive)    With palm and wrist flat on table, slide hand side to side like a windshield wiper. Do not move elbow. Repeat 10 times. Do 4 sessions per day.  Copyright  VHI. All rights reserved.

## 2019-05-21 NOTE — Therapy (Signed)
[facility] [address] [place], [place], [address] Phone: [phone]   Fax:  [phone]  Occupational Therapy Evaluation  Patient Details  Name: Bruce Simmons MRN: [id number] Date of Birth: 05-11-1976 Referring Provider (OT): Dr. Dannial Monarch   Encounter Date: 05/20/2019  OT End of Session - 05/20/19 2354    Visit Number  1    Number of Visits  12    Date for OT Re-Evaluation  07/04/19    Authorization Type  [employer] [employer]-- 8 OT visits approved    Authorization - Visit Number  1    Authorization - Number of Visits  8    OT Start Time  0815    OT Stop Time  0849    OT Time Calculation (min)  34 min    Activity Tolerance  Patient tolerated treatment well    Behavior During Therapy  [facility] for tasks assessed/performed       Past Medical History:  Diagnosis Date  . GERD (gastroesophageal reflux disease)   . Seasonal allergies   . Sleep apnea   . Sleep-related bruxism     History reviewed. No pertinent surgical history.  There were no vitals filed for this visit.  Subjective Assessment - 05/20/19 0823    Subjective   not hurting as much now, but is popping and grinding, has been wearing splint consistently in daytime and has not been lifting, pushing, or pulling    Pertinent History  R wrist sprain.   PMH:  GERD, Sleep apnea    Limitations  injury 04/29/19 (EMT while lifting pt)    Patient Stated Goals  no popping or grinding or pain, return to work/previous activities    Currently in Pain?  No/denies   0-5/10   Pain Location  Wrist   ulnarly   Pain Orientation  Right    Pain Descriptors / Indicators  Aching    Pain Type  Acute pain    Pain Onset  1 to 4 weeks ago    Pain Frequency  Intermittent    Aggravating Factors   movement (particularly with wrist flex and pronation)    Pain Relieving Factors  removing brace, ibrophren        [facility] OT Assessment - 05/20/19 0827      Assessment   Medical  Diagnosis  R wrist sprain    Referring Provider (OT)  Dr. Dannial Monarch    Onset Date/Surgical Date  04/29/19    Hand Dominance  Right    Next MD Visit  05/21/19    Prior Therapy  none      Precautions   Precautions  None      Balance Screen   Has the patient fallen in the past 6 months  No      Home  Environment   Family/patient expects to be discharged to:  Private residence    Lives With  Son;Spouse   64 y.o. 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    Leisure  fish, hunt, camp, hike   son in scouts     ADL   ADL comments  BADLs independently      IADL   Prior Level of Function Light Housekeeping  typically does yardwork (mowing/weedeating)--not currently performing; not currently washing dishes  Tree surgeon Status  Independent      Written Expression   Dominant Hand  Right    Handwriting  --   doesn't do much     Sensation   Light Touch  Appears Intact    Additional Comments  denies numbness/tingling      Coordination   Fine Motor Movements are Fluid and Coordinated  Yes      Edema   Edema  WNL      ROM / Strength   AROM / PROM / Strength  AROM;PROM      AROM   Overall AROM   Deficits    Overall AROM Comments  R wrist flex  30* (L 60*), extension 40* (L 60*), RD 10*, UD 15*, supination 50*, pronation 70*      PROM   Overall PROM   Within functional limits for tasks performed    Overall PROM Comments  minimal pain      Hand Function   Right Hand Grip (lbs)  29    Right Hand Lateral Pinch  4 lbs    Right Hand 3 Point Pinch  4 lbs    Left Hand Grip (lbs)  87    Left Hand Lateral Pinch  18 lbs    Left 3 point pinch  19 lbs               OT Education - 05/20/19 2350    Education Details  initial HEP for gentle AROM--see pt instructions   Recommended pt continue to wear splint (pre-fab wrist cock-up appears to be fitting well), continue with no pushing, pulling, lifting, gripping.  OT eval results/POC    Person(s) Educated  Patient    Methods  Explanation;Demonstration;Verbal cues;Handout    Comprehension  Verbalized understanding;Returned demonstration       OT Short Term Goals - 05/21/19 0007      OT SHORT TERM GOAL #1   Title  Pt will be independent with initial HEP for ROM.--check STGs 06/19/19    Time  4    Period  Weeks    Status  New      OT SHORT TERM GOAL #2   Title  Pt will demo at least 60* R wrist extension for ADLs.    Baseline  40*    Time  4    Period  Weeks    Status  New      OT SHORT TERM GOAL #3   Title  Pt will demo at least 60* R wrist flex for ADLs.    Baseline  30*    Time  4    Period  Weeks    Status  New      OT SHORT TERM GOAL #4   Title  Pt will demo supination/pronation WNL for ADLs.    Baseline  pronation 70*, supination 50*    Time  4    Period  Weeks    Status  New        OT Long Term Goals - 05/21/19 0001      OT LONG TERM GOAL #1   Title  Pt will be independent with strengthening HEP.--check LTGs 07/04/19    Time  6    Period  Weeks    Status  New      OT LONG TERM GOAL #2   Title  Pt will demo at least 75lbs R grip strength for  gripping/lifting tasks for work/leisure tasks.    Time  6    Period  Weeks    Status  New      OT LONG TERM GOAL #3   Title  Pt will report pain less than or equal to 1/10 for ADLs/IADLs.    Time  6    Period  Weeks    Status  New      OT LONG TERM GOAL #4   Title  Pt will demo at least 15lbs each of lateral and 3point pinch strengths.    Baseline  lateral-4lbs, 3point-4lbs    Time  6    Period  Weeks    Status  New      OT LONG TERM GOAL #5   Title  Pt will be able to lift/carry 45lbs with BUEs with pain less than or equal to 1/10    Time  6    Period  Weeks    Status  New            Plan - 05/20/19 2355     Clinical Impression Statement  Pt is a 43 y.o. male referred to occupational therapy for R wrist sprain.  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  Pt with PMH that includes GERD and sleep apnea.  Pt presents today with pain, decr ROM, and decr strength affecting dominant RUE functional use.  Pt would benefit from occupational therapy to address these deficits for decr pain, improved dominant RUE functional use, and ability to return to previous ADL, IADL, leisure, and work tasks.    OT Occupational Profile and History  Problem Focused Assessment - Including review of records relating to presenting problem    Occupational performance deficits (Please refer to evaluation for details):  ADL's;IADL's;Work;Leisure;Social Participation    Body Structure / Function / Physical Skills  ADL;Strength;UE functional use;Pain;ROM;IADL    Rehab Potential  Good    Clinical Decision Making  Limited treatment options, no task modification necessary    Comorbidities Affecting Occupational Performance:  None    Modification or Assistance to Complete Evaluation   No modification of tasks or assist necessary to complete eval    OT Frequency  2x / week    OT Duration  6 weeks   +eval   OT Treatment/Interventions  Self-care/ADL training;Therapeutic exercise;Splinting;Manual Therapy;Neuromuscular education;Ultrasound;Therapeutic activities;DME and/or AE instruction;Paraffin;Cryotherapy;Electrical Stimulation;Fluidtherapy;Passive range of motion;Patient/family education;Contrast Bath;Moist Heat    Plan  review AROM HEP, gentle joint mobs/PROM as tolerated, modalities, isometrics depending on pain    OT Home Exercise Plan  Education provided:  AROM HEP    Consulted and Agree with Plan of Care  Patient       Patient will benefit from skilled therapeutic intervention in order to improve the following deficits and impairments:   Body Structure / Function / Physical Skills: ADL, Strength, UE  functional use, Pain, ROM, IADL       Visit Diagnosis: Stiffness of right wrist, not elsewhere classified  Muscle weakness (generalized)  Pain in right wrist    Problem List Patient Active Problem List   Diagnosis Date Noted  . OSA (obstructive sleep apnea) 11/11/2013    [facility] 05/21/2019, 12:19 AM  [facility] [address] [place], [place], [address] Phone: [phone]   Fax:  [phone]  Name: CAYMEN BUMANGLAG MRN: [id number] Date of Birth: 1975-12-11   Vianne Bulls, OTR/L [facility] [place]  [place], [place]  [address] [phone] phone [phone] 05/21/19 12:19 AM

## 2019-05-22 DIAGNOSIS — Z0000 Encounter for general adult medical examination without abnormal findings: Secondary | ICD-10-CM | POA: Diagnosis not present

## 2019-05-22 DIAGNOSIS — Z808 Family history of malignant neoplasm of other organs or systems: Secondary | ICD-10-CM | POA: Diagnosis not present

## 2019-05-26 ENCOUNTER — Ambulatory Visit: Payer: PRIVATE HEALTH INSURANCE | Admitting: Occupational Therapy

## 2019-05-27 ENCOUNTER — Encounter: Payer: Self-pay | Admitting: Occupational Therapy

## 2019-05-27 ENCOUNTER — Ambulatory Visit: Payer: PRIVATE HEALTH INSURANCE | Attending: Occupational Medicine | Admitting: Occupational Therapy

## 2019-05-27 ENCOUNTER — Other Ambulatory Visit: Payer: Self-pay

## 2019-05-27 DIAGNOSIS — M25631 Stiffness of right wrist, not elsewhere classified: Secondary | ICD-10-CM | POA: Insufficient documentation

## 2019-05-27 DIAGNOSIS — M6281 Muscle weakness (generalized): Secondary | ICD-10-CM | POA: Insufficient documentation

## 2019-05-27 DIAGNOSIS — M25531 Pain in right wrist: Secondary | ICD-10-CM | POA: Insufficient documentation

## 2019-05-27 NOTE — Patient Instructions (Signed)
      PROM: Wrist Flexion / Extension   Grasp right hand and slowly bend wrist until stretch is felt. Relax. Then stretch as far as possible in opposite direction. Be sure to keep elbow bent. Repeat 5 times per set.  Hold 10 sec.  2x/day      Keep elbow bent at right angle and held firmly at side. Use other hand to turn forearm until palm faces upward. Hold 10 seconds. Repeat 5 times. Do 2 sessions per day.    Composite Extension (Passive Flexor Stretch)    Sitting with elbows on table and palms together, slowly lower wrists toward table until stretch is felt. Be sure to keep palms together throughout stretch. Hold ____ seconds. Relax. Repeat 5 times. Hold 10 sec Do 2 sessions per day.

## 2019-05-27 NOTE — Therapy (Signed)
[facility] [address] [place], [place], [address] Phone: [phone]   Fax:  [phone]  Occupational Therapy Treatment  Patient Details  Name: Bruce Simmons MRN: [id number] Date of Birth: [birth year] Referring Provider (OT): Dr. Dannial Monarch   Encounter Date: 05/27/2019  OT End of Session - 05/27/19 1549    Visit Number  2    Number of Visits  13    Date for OT Re-Evaluation  07/04/19    Authorization Type  [employer] [employer]-- 8 OT visits approved    Authorization - Visit Number  2    Authorization - Number of Visits  8    OT Start Time  1020    OT Stop Time  1105    OT Time Calculation (min)  45 min    Activity Tolerance  Patient tolerated treatment well    Behavior During Therapy  WFL for tasks assessed/performed       Past Medical History:  Diagnosis Date  . GERD (gastroesophageal reflux disease)   . Seasonal allergies   . Sleep apnea   . Sleep-related bruxism     History reviewed. No pertinent surgical history.  There were no vitals filed for this visit.  Subjective Assessment - 05/27/19 1032    Subjective   still popping which is concerning.  Pt reports that he was supposed to be referred to Ortho MD    Pertinent History  R wrist sprain.   PMH:  GERD, Sleep apnea    Limitations  injury 04/29/19 (EMT while lifting pt)    Patient Stated Goals  no popping or grinding or pain, return to work/previous activities    Currently in Pain?  Yes    Pain Score  5     Pain Location  Wrist    Pain Orientation  Right    Pain Descriptors / Indicators  Aching    Pain Type  Acute pain    Pain Onset  1 to 4 weeks ago    Pain Frequency  Intermittent    Aggravating Factors   movement    Pain Relieving Factors  meds, rest         Fludio x 50min to right hand for pain, stiffness with no adverse reactions.    Reviewed AROM HEP and pt returned demo each with noted decr pain this week.    Gentle joint  mobs/distraction to R wrist within pt tolerance.  Attempted wrist ext isometrics and pt tolerated well; however, isometrics with wrist flex with incr pain (discontinued).  Hot pack x75min to R wrist at end of session for pain with 4/10 pain reported at end of session (no adverse reactions).  Pt politely declines ice as he reports that when tried at home, it is not effective, but reports pain relief with heat.           OT Education - 05/27/19 1548    Education Details  Additions to HEP (PROM within tolerance)--see pt instructions.  Also recommended pt contact MD again regarding ortho referral.    Person(s) Educated  Patient    Methods  Explanation;Demonstration;Verbal cues;Handout    Comprehension  Verbalized understanding;Returned demonstration       OT Short Term Goals - 05/21/19 0007      OT SHORT TERM GOAL #1   Title  Pt will be independent with initial HEP for ROM.--check STGs 06/19/19    Time  4    Period  Weeks    Status  New      OT SHORT TERM GOAL #2   Title  Pt will demo at least 60* R wrist extension for ADLs.    Baseline  40*    Time  4    Period  Weeks    Status  New      OT SHORT TERM GOAL #3   Title  Pt will demo at least 60* R wrist flex for ADLs.    Baseline  30*    Time  4    Period  Weeks    Status  New      OT SHORT TERM GOAL #4   Title  Pt will demo supination/pronation WNL for ADLs.    Baseline  pronation 70*, supination 50*    Time  4    Period  Weeks    Status  New        OT Long Term Goals - 05/21/19 0001      OT LONG TERM GOAL #1   Title  Pt will be independent with strengthening HEP.--check LTGs 07/04/19    Time  6    Period  Weeks    Status  New      OT LONG TERM GOAL #2   Title  Pt will demo at least 75lbs R grip strength for gripping/lifting tasks for work/leisure tasks.    Time  6    Period  Weeks    Status  New      OT LONG TERM GOAL #3   Title  Pt will report pain less than or equal to 1/10 for ADLs/IADLs.    Time   6    Period  Weeks    Status  New      OT LONG TERM GOAL #4   Title  Pt will demo at least 15lbs each of lateral and 3point pinch strengths.    Baseline  lateral-4lbs, 3point-4lbs    Time  6    Period  Weeks    Status  New      OT LONG TERM GOAL #5   Title  Pt will be able to lift/carry 45lbs with BUEs with pain less than or equal to 1/10    Time  6    Period  Weeks    Status  New            Plan - 05/27/19 1550    Clinical Impression Statement  Pt is progressing towards goals with decr pain with AROM today and improved AROM.  Pt tolerated gentle PROM well, but pain incr with trial of isometrics therefore discontinued.    OT Occupational Profile and History  Problem Focused Assessment - Including review of records relating to presenting problem    Occupational performance deficits (Please refer to evaluation for details):  ADL's;IADL's;Work;Leisure;Social Participation    Body Structure / Function / Physical Skills  ADL;Strength;UE functional use;Pain;ROM;IADL    Rehab Potential  Good    Clinical Decision Making  Limited treatment options, no task modification necessary    Comorbidities Affecting Occupational Performance:  None    Modification or Assistance to Complete Evaluation   No modification of tasks or assist necessary to complete eval    OT Frequency  2x / week    OT Duration  6 weeks   +eval   OT Treatment/Interventions  Self-care/ADL training;Therapeutic exercise;Splinting;Manual Therapy;Neuromuscular education;Ultrasound;Therapeutic activities;DME and/or AE instruction;Paraffin;Cryotherapy;Electrical Stimulation;Fluidtherapy;Passive range of motion;Patient/family education;Contrast Bath;Moist Heat    Plan  gentle joint mobs/PROM as tolerated, modalities, ?isometrics  depending on pain    OT Home Exercise Plan  Education provided:  AROM HEP    Consulted and Agree with Plan of Care  Patient       Patient will benefit from skilled therapeutic intervention in order to  improve the following deficits and impairments:   Body Structure / Function / Physical Skills: ADL, Strength, UE functional use, Pain, ROM, IADL       Visit Diagnosis: Stiffness of right wrist, not elsewhere classified  Pain in right wrist  Muscle weakness (generalized)    Problem List Patient Active Problem List   Diagnosis Date Noted  . OSA (obstructive sleep apnea) 11/11/2013    [facility] 05/27/2019, 3:55 PM  [facility] [address] [place], [place], [address] Phone: [phone]   Fax:  [phone]  Name: Bruce Simmons MRN: [id number] Date of Birth: 11-11-1975   Vianne Bulls, OTR/L [facility] [address] [place], [place]  [address] [phone] phone [phone] 05/27/19 3:55 PM

## 2019-06-02 ENCOUNTER — Encounter: Payer: Self-pay | Admitting: Occupational Therapy

## 2019-06-02 ENCOUNTER — Other Ambulatory Visit: Payer: Self-pay

## 2019-06-02 ENCOUNTER — Ambulatory Visit: Payer: PRIVATE HEALTH INSURANCE | Admitting: Occupational Therapy

## 2019-06-02 DIAGNOSIS — M6281 Muscle weakness (generalized): Secondary | ICD-10-CM

## 2019-06-02 DIAGNOSIS — M25631 Stiffness of right wrist, not elsewhere classified: Secondary | ICD-10-CM

## 2019-06-02 DIAGNOSIS — M25531 Pain in right wrist: Secondary | ICD-10-CM

## 2019-06-02 NOTE — Therapy (Signed)
[place] [address] [place], [place], [address] Phone: [phone]   Fax:  [phone]  Occupational Therapy Treatment  Patient Details  Name: Bruce Simmons MRN: [id number] Date of Birth: [birth year] Referring Provider (OT): Dr. Dannial Monarch   Encounter Date: 06/02/2019  OT End of Session - 06/02/19 0726    Visit Number  3    Number of Visits  13    Date for OT Re-Evaluation  07/04/19    Authorization Type  [employer]-- 8 OT visits approved    Authorization - Visit Number  3    Authorization - Number of Visits  8    OT Start Time  0722    OT Stop Time  0800    OT Time Calculation (min)  38 min    Activity Tolerance  Patient tolerated treatment well    Behavior During Therapy  [facility] for tasks assessed/performed       Past Medical History:  Diagnosis Date  . GERD (gastroesophageal reflux disease)   . Seasonal allergies   . Sleep apnea   . Sleep-related bruxism     History reviewed. No pertinent surgical history.  There were no vitals filed for this visit.  Subjective Assessment - 06/02/19 0725    Subjective   referral has been placed to Ortho, but hasn't been called yet.  Only taken meds 2x since last visit.  It seems to be improving.    Pertinent History  R wrist sprain.   PMH:  GERD, Sleep apnea    Limitations  injury 04/29/19 (EMT while lifting pt)    Patient Stated Goals  no popping or grinding or pain, return to work/previous activities    Currently in Pain?  No/denies    Pain Onset  1 to 4 weeks ago         Fludio x 18min to R hand for pain, stiffness with no adverse reactions.    Gentle joint mobs/distraction to R wrist within pt tolerance.  AROM:  Wrist flex/ext, supination/pronation, UD/RD x10-15 each.  PROM:  Wrist flex and ext (prayer stretch), supination, UD, RD with min cueing        OT Education - 06/02/19 0739    Education Details  Additions to HEP (isometrics,  supination/pronation with hammer)--see pt instructions.    Person(s) Educated  Patient    Methods  Explanation;Demonstration;Verbal cues;Handout    Comprehension  Verbalized understanding;Returned demonstration;Verbal cues required       OT Short Term Goals - 05/21/19 0007      OT SHORT TERM GOAL #1   Title  Pt will be independent with initial HEP for ROM.--check STGs 06/19/19    Time  4    Period  Weeks    Status  New      OT SHORT TERM GOAL #2   Title  Pt will demo at least 60* R wrist extension for ADLs.    Baseline  40*    Time  4    Period  Weeks    Status  New      OT SHORT TERM GOAL #3   Title  Pt will demo at least 60* R wrist flex for ADLs.    Baseline  30*    Time  4    Period  Weeks    Status  New      OT SHORT TERM GOAL #4   Title  Pt will demo supination/pronation WNL for ADLs.  Baseline  pronation 70*, supination 50*    Time  4    Period  Weeks    Status  New        OT Long Term Goals - 05/21/19 0001      OT LONG TERM GOAL #1   Title  Pt will be independent with strengthening HEP.--check LTGs 07/04/19    Time  6    Period  Weeks    Status  New      OT LONG TERM GOAL #2   Title  Pt will demo at least 75lbs R grip strength for gripping/lifting tasks for work/leisure tasks.    Time  6    Period  Weeks    Status  New      OT LONG TERM GOAL #3   Title  Pt will report pain less than or equal to 1/10 for ADLs/IADLs.    Time  6    Period  Weeks    Status  New      OT LONG TERM GOAL #4   Title  Pt will demo at least 15lbs each of lateral and 3point pinch strengths.    Baseline  lateral-4lbs, 3point-4lbs    Time  6    Period  Weeks    Status  New      OT LONG TERM GOAL #5   Title  Pt will be able to lift/carry 45lbs with BUEs with pain less than or equal to 1/10    Time  6    Period  Weeks    Status  New            Plan - 06/02/19 [id number]    Clinical Impression Statement  Pt is progressing well with improving pain and ROM and reports  less "grinding".    OT Occupational Profile and History  Problem Focused Assessment - Including review of records relating to presenting problem    Occupational performance deficits (Please refer to evaluation for details):  ADL's;IADL's;Work;Leisure;Social Participation    Body Structure / Function / Physical Skills  ADL;Strength;UE functional use;Pain;ROM;IADL    Rehab Potential  Good    Clinical Decision Making  Limited treatment options, no task modification necessary    Comorbidities Affecting Occupational Performance:  None    Modification or Assistance to Complete Evaluation   No modification of tasks or assist necessary to complete eval    OT Frequency  2x / week    OT Duration  6 weeks   +eval   OT Treatment/Interventions  Self-care/ADL training;Therapeutic exercise;Splinting;Manual Therapy;Neuromuscular education;Ultrasound;Therapeutic activities;DME and/or AE instruction;Paraffin;Cryotherapy;Electrical Stimulation;Fluidtherapy;Passive range of motion;Patient/family education;Contrast Bath;Moist Heat    Plan  gentle joint mobs/AROM, modalities, reviewed HEP prn and progress as tolerated    OT Home Exercise Plan  Education provided:  AROM HEP    Consulted and Agree with Plan of Care  Patient       Patient will benefit from skilled therapeutic intervention in order to improve the following deficits and impairments:   Body Structure / Function / Physical Skills: ADL, Strength, UE functional use, Pain, ROM, IADL       Visit Diagnosis: Pain in right wrist  Stiffness of right wrist, not elsewhere classified  Muscle weakness (generalized)    Problem List Patient Active Problem List   Diagnosis Date Noted  . OSA (obstructive sleep apnea) 11/11/2013    [facility] 06/02/2019, 8:23 AM  [place] [address] [place], [place], [address] Phone: [phone]  Fax:  [phone]  Name: Bruce Simmons MRN:  [id number] Date of Birth: April 16, 1976   Vianne Bulls, OTR/L [facility] [address] [place], [place]  [address] [phone] phone [phone] 06/02/19 8:23 AM

## 2019-06-02 NOTE — Patient Instructions (Addendum)
Wrist Ulnar Deviation: Isometric    With left forearm resting on thigh, thumb up, use other hand to resist downward movement of hand at wrist. Hold 5 seconds. Relax. Repeat 5 times per set.  Do 2 sessions per day.  Copyright  VHI. All rights reserved.  Wrist Radial Deviation: Isometric    With left forearm resting on thigh, thumb up, use other hand to resist upward movement of hand at wrist. Hold 5 seconds. Relax. Repeat 5 times per set.  Do 2 sessions per day.  Copyright  VHI. All rights reserved.  Wrist Flexion: Isometric    With left forearm resting palm up on thigh, resist upward movement of hand with other hand. Hold 5 seconds. Relax. Repeat 5 times per set. Do 2 sessions per day.  Copyright  VHI. All rights reserved.  Wrist Extension: Isometric    With left forearm resting palm down on thigh, resist upward movement of hand with other hand. Hold 5 seconds. Relax. Repeat 5 times per set.  Do 2 sessions per day.  Copyright  VHI. All rights reserved.    Pronation / Supination (Resistive)    Hold hammer and rotate palm up and down. Keep elbow flexed at side and wrist straight. Repeat 10 times. Do 2 sessions per day.

## 2019-06-08 ENCOUNTER — Ambulatory Visit: Payer: PRIVATE HEALTH INSURANCE | Attending: Occupational Medicine | Admitting: Occupational Therapy

## 2019-06-08 DIAGNOSIS — M25531 Pain in right wrist: Secondary | ICD-10-CM | POA: Insufficient documentation

## 2019-06-08 DIAGNOSIS — M25631 Stiffness of right wrist, not elsewhere classified: Secondary | ICD-10-CM | POA: Insufficient documentation

## 2019-06-08 DIAGNOSIS — M6281 Muscle weakness (generalized): Secondary | ICD-10-CM | POA: Insufficient documentation

## 2019-06-10 ENCOUNTER — Encounter: Payer: Self-pay | Admitting: Occupational Therapy

## 2019-06-10 ENCOUNTER — Ambulatory Visit: Payer: PRIVATE HEALTH INSURANCE | Admitting: Occupational Therapy

## 2019-06-10 ENCOUNTER — Other Ambulatory Visit: Payer: Self-pay

## 2019-06-10 DIAGNOSIS — M6281 Muscle weakness (generalized): Secondary | ICD-10-CM

## 2019-06-10 DIAGNOSIS — M25531 Pain in right wrist: Secondary | ICD-10-CM

## 2019-06-10 DIAGNOSIS — M25631 Stiffness of right wrist, not elsewhere classified: Secondary | ICD-10-CM | POA: Diagnosis not present

## 2019-06-10 NOTE — Therapy (Addendum)
[facility] [place] [address] [place], [place], [address] Phone: [phone]   Fax:  [phone]  Occupational Therapy Treatment  Patient Details  Name: Bruce Simmons MRN: [id number] Date of Birth: August 02, 1976 Referring Provider (OT): Dr. Dannial Monarch   Encounter Date: 06/10/2019  OT End of Session - 06/10/19 0857    Visit Number  4    Number of Visits  13    Date for OT Re-Evaluation  07/04/19    Authorization Type  [employer]-- 8 OT visits approved    Authorization - Visit Number  4    Authorization - Number of Visits  8    OT Start Time  0850    OT Stop Time  0930    OT Time Calculation (min)  40 min    Activity Tolerance  Patient tolerated treatment well    Behavior During Therapy  [facility] for tasks assessed/performed       Past Medical History:  Diagnosis Date  . GERD (gastroesophageal reflux disease)   . Seasonal allergies   . Sleep apnea   . Sleep-related bruxism     History reviewed. No pertinent surgical history.  There were no vitals filed for this visit.  Subjective Assessment - 06/10/19 0855    Subjective   Been hurting more the last few days.  Hurt a lot yesterday, had to drive 4 hrs for work.  I've been trying to do a little more at home (wearing splint).  Ortho appt 10/15    Pertinent History  R wrist sprain.   PMH:  GERD, Sleep apnea    Limitations  injury 04/29/19 (EMT while lifting pt)    Patient Stated Goals  no popping or grinding or pain, return to work/previous activities    Currently in Pain?  Yes    Pain Score  4     Pain Location  Wrist    Pain Orientation  Right    Pain Descriptors / Indicators  Aching    Pain Type  Acute pain    Pain Onset  1 to 4 weeks ago    Pain Frequency  Intermittent    Aggravating Factors   movement    Pain Relieving Factors  meds, rest         Fludio x 52mn to R hand for pain, stiffness with no adverse reactions.    AROM:  Wrist flex/ext,  supination/pronation, UD/RD x10-15 each.  PROM Wrist ext (prayer stretch)  AAROM  Supination/pronation with roller on the table  Supination/Pronation with hammer (held in middle)   Checked ROM/progress towards STGs--see below  Isometric exercises for wrist flex, ext, RD.    Discussed Iontophoresis and will sent request to MD--pt verbalized agreement          OT Short Term Goals - 06/10/19 0859      OT SHORT TERM GOAL #1   Title  Pt will be independent with initial HEP for ROM.--check STGs 06/19/19    Time  4    Period  Weeks    Status  Achieved      OT SHORT TERM GOAL #2   Title  Pt will demo at least 60* R wrist extension for ADLs.    Baseline  40*    Time  4    Period  Weeks    Status  On-going   06/10/19:  50*     OT SHORT TERM GOAL #3   Title  Pt will demo at  least 60* R wrist flex for ADLs.    Baseline  30*    Time  4    Period  Weeks    Status  Achieved   06/10/19:  60*     OT SHORT TERM GOAL #4   Title  Pt will demo supination/pronation WNL for ADLs.    Baseline  pronation 70*, supination 50*    Time  4    Period  Weeks    Status  On-going   06/10/19:  supination 60*, Pronation 70*       OT Long Term Goals - 05/21/19 0001      OT LONG TERM GOAL #1   Title  Pt will be independent with strengthening HEP.--check LTGs 07/04/19    Time  6    Period  Weeks    Status  New      OT LONG TERM GOAL #2   Title  Pt will demo at least 75lbs R grip strength for gripping/lifting tasks for work/leisure tasks.    Time  6    Period  Weeks    Status  New      OT LONG TERM GOAL #3   Title  Pt will report pain less than or equal to 1/10 for ADLs/IADLs.    Time  6    Period  Weeks    Status  New      OT LONG TERM GOAL #4   Title  Pt will demo at least 15lbs each of lateral and 3point pinch strengths.    Baseline  lateral-4lbs, 3point-4lbs    Time  6    Period  Weeks    Status  New      OT LONG TERM GOAL #5   Title  Pt will be able to lift/carry 45lbs  with BUEs with pain less than or equal to 1/10    Time  6    Period  Weeks    Status  New            Plan - 06/10/19 0857    Clinical Impression Statement  STGs #1 & 3 met and pt is progressing towards remaining STGs.  Pt continues to report pain with repetitive movements and gripping tasks, but decr pain with ROM.    OT Occupational Profile and History  Problem Focused Assessment - Including review of records relating to presenting problem    Occupational performance deficits (Please refer to evaluation for details):  ADL's;IADL's;Work;Leisure;Social Participation    Body Structure / Function / Physical Skills  ADL;Strength;UE functional use;Pain;ROM;IADL    Rehab Potential  Good    Clinical Decision Making  Limited treatment options, no task modification necessary    Comorbidities Affecting Occupational Performance:  None    Modification or Assistance to Complete Evaluation   No modification of tasks or assist necessary to complete eval    OT Frequency  2x / week    OT Duration  6 weeks   +eval   OT Treatment/Interventions  Self-care/ADL training;Therapeutic exercise;Splinting;Manual Therapy;Neuromuscular education;Ultrasound;Therapeutic activities;DME and/or AE instruction;Paraffin;Cryotherapy;Electrical Stimulation;Fluidtherapy;Passive range of motion;Patient/family education;Contrast Bath;Moist Heat;Iontophoresis    Plan  consider Ionto if order received from MD (order sent to MD)    OT Home Exercise Plan  Education provided:  AROM HEP    Consulted and Agree with Plan of Care  Patient       Patient will benefit from skilled therapeutic intervention in order to improve the following deficits and impairments:   Body Structure / Function /  Physical Skills: ADL, Strength, UE functional use, Pain, ROM, IADL       Visit Diagnosis: Pain in right wrist - Plan: Ot plan of care cert/re-cert  Stiffness of right wrist, not elsewhere classified - Plan: Ot plan of care  cert/re-cert  Muscle weakness (generalized) - Plan: Ot plan of care cert/re-cert    Problem List Patient Active Problem List   Diagnosis Date Noted  . OSA (obstructive sleep apnea) 11/11/2013    [facility] 06/10/2019, 4:01 PM  [place] [address] [place], [place], [address] Phone: [phone]   Fax:  [phone]  Name: Bruce Simmons MRN: [id number] Date of Birth: [birth year]   Vianne Bulls, OTR/L [facility] [address] [place], [place]  [address] [phone] phone [phone] 06/10/19 4:01 PM

## 2019-06-16 ENCOUNTER — Other Ambulatory Visit: Payer: Self-pay

## 2019-06-16 ENCOUNTER — Encounter: Payer: Self-pay | Admitting: Occupational Therapy

## 2019-06-16 ENCOUNTER — Ambulatory Visit: Payer: PRIVATE HEALTH INSURANCE | Admitting: Occupational Therapy

## 2019-06-16 DIAGNOSIS — M25631 Stiffness of right wrist, not elsewhere classified: Secondary | ICD-10-CM | POA: Diagnosis not present

## 2019-06-16 DIAGNOSIS — M25531 Pain in right wrist: Secondary | ICD-10-CM | POA: Diagnosis not present

## 2019-06-16 DIAGNOSIS — M6281 Muscle weakness (generalized): Secondary | ICD-10-CM | POA: Diagnosis not present

## 2019-06-16 NOTE — Patient Instructions (Addendum)
  06/16/19   To whom it may concern,  Abhijeet Cleavenger has been seen for occupational therapy for eval +4 visits.  He is continuing to wear splint and limit activity, use heat for pain, and perform his HEP.  His ROM and pain with ROM is significantly improved; however, he continues to report pain and "rubbing/popping."  He is scheduled for 3 additional visits (as approved by Workers [employer]).    1. Please include if you recommended additional visits ([employer] approval will be needed to be requested)   2.  Please include any additional diagnosis information, precautions, and/or treatment recommendations.   3.  Do you feel that Iontophoresis would be beneficial?  If so, please sign the attached order for this note.    Please feel free to contact me with questions or concerns.      Thank you,   Vianne Bulls, OTR/L [facility] [address] [place], [place]  [address] [phone] phone [phone] 06/16/19 8:17 AM                                     Wrist Flexion: Resisted   With right palm up, 1 pound weight in hand, bend wrist up. Return slowly. Repeat 10 times per set.  Do 2 sessions per day.  Wrist Extension: Resisted   With right palm down, 1 pound weight in hand, bend wrist up. Return slowly. Repeat 10 times per set. Do 2 sessions per day.      Extension (Assistive Putty)   Roll putty back and forth, being sure to use all fingertips. Repeat 2 times. Do 1-2 sessions per day.  Then pinch as below.     Grip Strengthening (Resistive Putty)   Squeeze putty using thumb and all fingers. Repeat 10 times. Do 1-2 sessions per day.

## 2019-06-16 NOTE — Therapy (Signed)
[place] [address] [place], [place], [address] Phone: [phone]   Fax:  [phone]  Occupational Therapy Treatment  Patient Details  Name: Bruce Simmons MRN: [id number] Date of Birth: May 25, 1976 Referring Provider (OT): Dr. Dannial Monarch   Encounter Date: 06/16/2019  OT End of Session - 06/16/19 0811    Visit Number  5    Number of Visits  13    Date for OT Re-Evaluation  07/04/19    Authorization Type  [employer] [employer]-- 8 OT visits approved    Authorization - Visit Number  5    Authorization - Number of Visits  8    OT Start Time  0805    OT Stop Time  0843    OT Time Calculation (min)  38 min    Activity Tolerance  Patient tolerated treatment well    Behavior During Therapy  [facility] for tasks assessed/performed       Past Medical History:  Diagnosis Date  . GERD (gastroesophageal reflux disease)   . Seasonal allergies   . Sleep apnea   . Sleep-related bruxism     History reviewed. No pertinent surgical history.  There were no vitals filed for this visit.  Subjective Assessment - 06/16/19 0820    Subjective   Pt reports worst pain in last 2 days 3/10.  "Its not been bad"    Pertinent History  R wrist sprain.   PMH:  GERD, Sleep apnea    Limitations  injury 04/29/19 (EMT while lifting pt)    Patient Stated Goals  no popping or grinding or pain, return to work/previous activities    Currently in Pain?  No/denies    Pain Onset  1 to 4 weeks ago         Fludio x 33min to R hand for pain, stiffness with no adverse reactions.    AROM:  Wrist flex/ext, UD/RD x10-15 each.  PROM Wrist ext (prayer stretch), wrist flex PROM  AAROM  Supination/pronation with roller on the table  Supination/Pronation with hammer (held toward the end today) for stretch/strength  Attempted red putty individual finger pinch, but pt reports pain with 4-5th digits (particularly 4th digit, and with isolated 4th digit  flex--pain felt at ular wrist volarly)  (Have not received Ionto order from MD)        OT Education - 06/16/19 0839    Education Details  Gentle strengthening HEP (wrist flex/ext with 1lb weight, red putty for grip)--see pt instructions.  Note for Ortho MD given to pt for upcoming appt.    Person(s) Educated  Patient    Methods  Explanation;Demonstration;Handout;Verbal cues    Comprehension  Verbalized understanding;Returned demonstration       OT Short Term Goals - 06/10/19 0859      OT SHORT TERM GOAL #1   Title  Pt will be independent with initial HEP for ROM.--check STGs 06/19/19    Time  4    Period  Weeks    Status  Achieved      OT SHORT TERM GOAL #2   Title  Pt will demo at least 60* R wrist extension for ADLs.    Baseline  40*    Time  4    Period  Weeks    Status  On-going   06/10/19:  50*     OT SHORT TERM GOAL #3   Title  Pt will demo at least 60* R wrist flex for ADLs.  Baseline  30*    Time  4    Period  Weeks    Status  Achieved   06/10/19:  60*     OT SHORT TERM GOAL #4   Title  Pt will demo supination/pronation WNL for ADLs.    Baseline  pronation 70*, supination 50*    Time  4    Period  Weeks    Status  On-going   06/10/19:  supination 60*, Pronation 70*       OT Long Term Goals - 05/21/19 0001      OT LONG TERM GOAL #1   Title  Pt will be independent with strengthening HEP.--check LTGs 07/04/19    Time  6    Period  Weeks    Status  New      OT LONG TERM GOAL #2   Title  Pt will demo at least 75lbs R grip strength for gripping/lifting tasks for work/leisure tasks.    Time  6    Period  Weeks    Status  New      OT LONG TERM GOAL #3   Title  Pt will report pain less than or equal to 1/10 for ADLs/IADLs.    Time  6    Period  Weeks    Status  New      OT LONG TERM GOAL #4   Title  Pt will demo at least 15lbs each of lateral and 3point pinch strengths.    Baseline  lateral-4lbs, 3point-4lbs    Time  6    Period  Weeks     Status  New      OT LONG TERM GOAL #5   Title  Pt will be able to lift/carry 45lbs with BUEs with pain less than or equal to 1/10    Time  6    Period  Weeks    Status  New            Plan - 06/16/19 [id number]    Clinical Impression Statement  Pt continues to progress towards goals.  Pt continues to pain with repetitive movements and end range ROM.  Pt also reports pain today with pinching/isolated flex of 4th digit.    OT Occupational Profile and History  Problem Focused Assessment - Including review of records relating to presenting problem    Occupational performance deficits (Please refer to evaluation for details):  ADL's;IADL's;Work;Leisure;Social Participation    Body Structure / Function / Physical Skills  ADL;Strength;UE functional use;Pain;ROM;IADL    Rehab Potential  Good    Clinical Decision Making  Limited treatment options, no task modification necessary    Comorbidities Affecting Occupational Performance:  None    Modification or Assistance to Complete Evaluation   No modification of tasks or assist necessary to complete eval    OT Frequency  2x / week    OT Duration  6 weeks   +eval   OT Treatment/Interventions  Self-care/ADL training;Therapeutic exercise;Splinting;Manual Therapy;Neuromuscular education;Ultrasound;Therapeutic activities;DME and/or AE instruction;Paraffin;Cryotherapy;Electrical Stimulation;Fluidtherapy;Passive range of motion;Patient/family education;Contrast Bath;Moist Heat;Iontophoresis    Plan  consider Ionto if order received from MD (order sent to Dr. Geoffry Paradise and sent note to Ortho MD--pt sees Thursday).  Continue with AROM/PROM and gentle strengthening as tolerated. (check pain with isolated 4th digit flexion/pinch)    OT Home Exercise Plan  Education provided:  AROM HEP    Consulted and Agree with Plan of Care  Patient       Patient will benefit from skilled therapeutic  intervention in order to improve the following deficits and impairments:   Body  Structure / Function / Physical Skills: ADL, Strength, UE functional use, Pain, ROM, IADL       Visit Diagnosis: Pain in right wrist  Stiffness of right wrist, not elsewhere classified  Muscle weakness (generalized)    Problem List Patient Active Problem List   Diagnosis Date Noted  . OSA (obstructive sleep apnea) 11/11/2013    [facility] 06/16/2019, 9:25 AM  [place] [address] [place], [place], [address] Phone: [phone]   Fax:  [phone]  Name: Bruce Simmons MRN: [id number] Date of Birth: 05-24-1976   Vianne Bulls, OTR/L [facility] [address] [place], [place]  [address] [phone] phone [phone] 06/16/19 9:25 AM

## 2019-06-18 ENCOUNTER — Ambulatory Visit: Payer: PRIVATE HEALTH INSURANCE | Admitting: Occupational Therapy

## 2019-06-18 ENCOUNTER — Encounter: Payer: Self-pay | Admitting: Occupational Therapy

## 2019-06-18 ENCOUNTER — Other Ambulatory Visit: Payer: Self-pay

## 2019-06-18 DIAGNOSIS — M25531 Pain in right wrist: Secondary | ICD-10-CM

## 2019-06-18 DIAGNOSIS — M25631 Stiffness of right wrist, not elsewhere classified: Secondary | ICD-10-CM

## 2019-06-18 DIAGNOSIS — M6281 Muscle weakness (generalized): Secondary | ICD-10-CM | POA: Diagnosis not present

## 2019-06-18 NOTE — Therapy (Signed)
h/strength [facility] [address] [place], [place], [address] Phone: [phone]   Fax:  [phone]  Occupational Therapy Treatment  Patient Details  Name: Bruce Simmons MRN: [id number] Date of Birth: 1976-04-10 Referring Provider (OT): Dr. Dannial Monarch   Encounter Date: 06/18/2019  OT End of Session - 06/18/19 0811    Visit Number  6    Number of Visits  13    Date for OT Re-Evaluation  07/04/19    Authorization Type  [facility] [employer]-- 8 OT visits approved    Authorization - Visit Number  6    Authorization - Number of Visits  8    OT Start Time  0805    OT Stop Time  0845    OT Time Calculation (min)  40 min    Activity Tolerance  Patient tolerated treatment well    Behavior During Therapy  [facility] for tasks assessed/performed       Past Medical History:  Diagnosis Date  . GERD (gastroesophageal reflux disease)   . Seasonal allergies   . Sleep apnea   . Sleep-related bruxism     History reviewed. No pertinent surgical history.  There were no vitals filed for this visit.  Subjective Assessment - 06/18/19 0923    Subjective   Pt reports he sees ortho MD today    Pertinent History  R wrist sprain.   PMH:  GERD, Sleep apnea    Limitations  injury 04/29/19 (EMT while lifting pt)    Patient Stated Goals  no popping or grinding or pain, return to work/previous activities    Currently in Pain?  Yes    Pain Score  3    worse with activity   Pain Location  Wrist    Pain Orientation  Right    Pain Descriptors / Indicators  Aching    Pain Type  Acute pain    Pain Onset  1 to 4 weeks ago    Aggravating Factors   use    Pain Relieving Factors  heat/ ice    Effect of Pain on Daily Activities  limits use                  Fludio x 25min to R hand for pain, stiffness with no adverse reactions.    AROM:  Wrist flex/ext, UD/RD x10-15 each, followed by performance with 1 lbs weight 10-15  reps  Supination/ pronation with light hammer  [place] 35mhz, 0.8 w/cm 2, 20%  to ulnar wrist and volar forearm x 8 mins, for pain and edema control  Gripper level 1 to pick up 1/2 the 1 inch blocks for sustained grip  Attempted forearm gym but pt was too painful  Ice massage x 5 mins with ice probe for pain relief end of session, no adverse reactions              OT Short Term Goals - 06/10/19 0859      OT SHORT TERM GOAL #1   Title  Pt will be independent with initial HEP for ROM.--check STGs 06/19/19    Time  4    Period  Weeks    Status  Achieved      OT SHORT TERM GOAL #2   Title  Pt will demo at least 60* R wrist extension for ADLs.    Baseline  40*    Time  4    Period  Weeks    Status  On-going  06/10/19:  50*     OT SHORT TERM GOAL #3   Title  Pt will demo at least 60* R wrist flex for ADLs.    Baseline  30*    Time  4    Period  Weeks    Status  Achieved   06/10/19:  60*     OT SHORT TERM GOAL #4   Title  Pt will demo supination/pronation WNL for ADLs.    Baseline  pronation 70*, supination 50*    Time  4    Period  Weeks    Status  On-going   06/10/19:  supination 60*, Pronation 70*       OT Long Term Goals - 05/21/19 0001      OT LONG TERM GOAL #1   Title  Pt will be independent with strengthening HEP.--check LTGs 07/04/19    Time  6    Period  Weeks    Status  New      OT LONG TERM GOAL #2   Title  Pt will demo at least 75lbs R grip strength for gripping/lifting tasks for work/leisure tasks.    Time  6    Period  Weeks    Status  New      OT LONG TERM GOAL #3   Title  Pt will report pain less than or equal to 1/10 for ADLs/IADLs.    Time  6    Period  Weeks    Status  New      OT LONG TERM GOAL #4   Title  Pt will demo at least 15lbs each of lateral and 3point pinch strengths.    Baseline  lateral-4lbs, 3point-4lbs    Time  6    Period  Weeks    Status  New      OT LONG TERM GOAL #5   Title  Pt will be able to lift/carry  45lbs with BUEs with pain less than or equal to 1/10    Time  6    Period  Weeks    Status  New            Plan - 06/18/19 [id number]    Clinical Impression Statement  Pt is progressing towards goals however pain is a limiting factor. He sees orthopedic MD today.    OT Occupational Profile and History  Problem Focused Assessment - Including review of records relating to presenting problem    Occupational performance deficits (Please refer to evaluation for details):  ADL's;IADL's;Work;Leisure;Social Participation    Body Structure / Function / Physical Skills  ADL;Strength;UE functional use;Pain;ROM;IADL    Rehab Potential  Good    Clinical Decision Making  Limited treatment options, no task modification necessary    Comorbidities Affecting Occupational Performance:  None    Modification or Assistance to Complete Evaluation   No modification of tasks or assist necessary to complete eval    OT Frequency  2x / week    OT Duration  6 weeks   +eval   OT Treatment/Interventions  Self-care/ADL training;Therapeutic exercise;Splinting;Manual Therapy;Neuromuscular education;Ultrasound;Therapeutic activities;DME and/or AE instruction;Paraffin;Cryotherapy;Electrical Stimulation;Fluidtherapy;Passive range of motion;Patient/family education;Contrast Bath;Moist Heat;Iontophoresis    Plan  consider Ionto if order received from MD (order sent to Dr. Geoffry Paradise and sent note to Ortho MD--pt sees Today).  Continue with AROM/PROM and gentle strengthening as tolerated. (check pain with isolated 4th digit flexion/pinch)    OT Home Exercise Plan  Education provided:  AROM HEP    Consulted and Agree with Plan  of Care  Patient       Patient will benefit from skilled therapeutic intervention in order to improve the following deficits and impairments:   Body Structure / Function / Physical Skills: ADL, Strength, UE functional use, Pain, ROM, IADL       Visit Diagnosis: Pain in right wrist  Stiffness of right  wrist, not elsewhere classified  Muscle weakness (generalized)    Problem List Patient Active Problem List   Diagnosis Date Noted  . OSA (obstructive sleep apnea) 11/11/2013    Sira Adsit 06/18/2019, 9:25 AM  [place] [address] [place], [place], [address] Phone: [phone]   Fax:  [phone]  Name: Bruce Simmons MRN: [id number] Date of Birth: Dec 18, 1975

## 2019-06-24 ENCOUNTER — Ambulatory Visit: Payer: PRIVATE HEALTH INSURANCE | Admitting: Occupational Therapy

## 2019-06-26 ENCOUNTER — Ambulatory Visit: Payer: PRIVATE HEALTH INSURANCE | Admitting: Occupational Therapy

## 2019-07-02 DIAGNOSIS — G4733 Obstructive sleep apnea (adult) (pediatric): Secondary | ICD-10-CM | POA: Diagnosis not present

## 2019-07-28 ENCOUNTER — Ambulatory Visit: Payer: PRIVATE HEALTH INSURANCE | Attending: Occupational Medicine | Admitting: Occupational Therapy

## 2019-07-28 ENCOUNTER — Other Ambulatory Visit: Payer: Self-pay

## 2019-07-28 DIAGNOSIS — M25531 Pain in right wrist: Secondary | ICD-10-CM | POA: Insufficient documentation

## 2019-07-28 DIAGNOSIS — M6281 Muscle weakness (generalized): Secondary | ICD-10-CM | POA: Insufficient documentation

## 2019-07-28 DIAGNOSIS — M25631 Stiffness of right wrist, not elsewhere classified: Secondary | ICD-10-CM | POA: Insufficient documentation

## 2019-07-28 NOTE — Therapy (Signed)
[place] [address] [place], [place], [address] Phone: [phone]   Fax:  [phone]  Occupational Therapy Evaluation  Patient Details  Name: Bruce Simmons MRN: [id number] Date of Birth: September 23, 1975 Referring Provider (OT): Dr. Milly Jakob   Encounter Date: 07/28/2019  OT End of Session - 07/28/19 0847    Visit Number  1    Number of Visits  13    Date for OT Re-Evaluation  09/18/19    Authorization Type  [facility] [employer] - unknown how many approved visits at eval secondary to pt filed as UMR    OT Start Time  0800    OT Stop Time  0845    OT Time Calculation (min)  45 min    Activity Tolerance  Patient tolerated treatment well    Behavior During Therapy  [facility] for tasks assessed/performed       Past Medical History:  Diagnosis Date  . GERD (gastroesophageal reflux disease)   . Seasonal allergies   . Sleep apnea   . Sleep-related bruxism     No past surgical history on file.  There were no vitals filed for this visit.  Subjective Assessment - 07/28/19 0808    Subjective   The doctor told me to hold off on therapy for a while, then told me to come back. That's why I'm here now. 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 R wrist sprain.   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  Yes    Pain Score  --   fluctuates - increases w/ activity and lifting   Pain Location  Wrist    Pain Orientation  Right    Pain Descriptors / Indicators  Aching;Dull    Pain Type  Acute pain    Pain Onset  More than a month ago    Pain Frequency  Intermittent    Aggravating Factors   use    Pain Relieving Factors  heat, ice        [facility] OT Assessment - 07/28/19 0001      Assessment   Medical Diagnosis  Rt FCU rupture    Referring  Provider (OT)  Dr. Milly Jakob    Onset Date/Surgical Date  04/29/19   new diagnosis w/ updated referral 07/21/19   Hand Dominance  Right    Prior Therapy  --   receiving therapy for Rt wrist sprain approx 1 month ago     Precautions   Precaution Comments  no lifting > 5 lbs at work, however can begin heavier simulated work activities in [place]   Has the patient fallen in the past 6 months  No    Has the patient had a decrease in activity level because of a fear of falling?   No    Is the patient reluctant to leave their home because of a fear of falling?   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   Leisure  fish, hunt, camp, hike      ADL   Eating/Feeding  Independent    Grooming  Independent    Upper Body Bathing  Independent    Lower Body Bathing  Independent    Upper Body Dressing  Independent    Lower Body Dressing  Independent    Banker -  Control and instrumentation engineer  Independent      IADL   Shopping  Shops independently for small purchases    Light Housekeeping  --   Pt does except heavy lifting   Meal Prep  Plans, prepares and serves adequate meals independently    Investment banker, corporate own vehicle    Medication Management  Is responsible for taking medication in correct dosages at correct time      Mobility   Mobility Status  Independent      Written Expression   Dominant Hand  Right    Handwriting  --   no changes per pt report     Vision - History   Baseline Vision  No visual deficits      Sensation   Light Touch  Appears Intact      Coordination   9 Hole Peg Test  Right;Left    Right 9 Hole Peg Test  30.10 sec   (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 See below for wrist ROM    AROM Assessment Site  Wrist    Right/Left Wrist  Right    Right Wrist Extension  46 Degrees    Right Wrist Flexion  50 Degrees    Right Wrist Radial Deviation  28 Degrees    Right Wrist Ulnar Deviation  25 Degrees   w/ pain 2/10     Right Hand AROM   R Thumb Opposition to Index  --   Rt thumb opposition only to 3rd digit     Hand Function   Right Hand Grip (lbs)  20    Left Hand Grip (lbs)  102                        OT Short Term Goals - 07/28/19 0953      OT SHORT TERM GOAL #1   Title  Independent with updated strengthening HEP    Time  3    Period  Weeks    Status  New      OT SHORT TERM GOAL #2   Title  Pt will improve ulnar deviation by 5* or greater    Baseline  25*    Time  3    Period  Weeks    Status  New      OT SHORT TERM GOAL #3   Title  Grip strength Rt hand to be 30 lbs or greater    Baseline  20 lbs    Time  3    Period  Weeks    Status  New      OT SHORT TERM GOAL #4   Title  Pt's pain level to remain 3/10 or under w/ light strengthening activities    Time  3    Period  Weeks    Status  New        OT Long Term Goals - 07/28/19 0955      OT LONG TERM GOAL #1   Title  Pt to improve Rt grip strength to 40 lbs or greater    Baseline  20 lbs    Time  6    Period  Weeks    Status  New      OT LONG TERM GOAL #2   Title  Pt to simulate work activities w/ pain less than or equal to 5/10    Time  6    Period  Weeks    Status  New      OT LONG TERM GOAL #3   Title  Pt to improve UD by 10* Rt wrist    Baseline  25*    Time  6    Period  Weeks    Status  New      OT LONG TERM GOAL #4   Title  Pt to oppose thumb to 5th digit Rt hand in prep for functional tasks    Time  6    Period  Weeks    Status  New            Plan - 07/28/19 0949    Clinical Impression  Statement  Pt returns today for new evaluation with new diagnosis for Rt wrist pain: Rt FCU rupture (new referral by Dr. Grandville Silos). Pt presents with decreased wrist and grip strength, decreased ulnar deviation, and decreased overall function w/ work related tasks w/ increased pain. MD states he wants patient to focus more on strengthening with this new round of therapy.    OT Occupational Profile and History  Problem Focused Assessment - Including review of records relating to presenting problem    Occupational performance deficits (Please refer to evaluation for details):  ADL's;IADL's;Work;Leisure;Social Participation    Body Structure / Function / Physical Skills  Strength;UE functional use;Pain;ROM;IADL    Rehab Potential  Good    Comorbidities Affecting Occupational Performance:  None    Modification or Assistance to Complete Evaluation   No modification of tasks or assist necessary to complete eval    OT Frequency  2x / week    OT Duration  6 weeks   plus eval   OT Treatment/Interventions  Self-care/ADL training;Therapeutic exercise;Splinting;Manual Therapy;Neuromuscular education;Ultrasound;Therapeutic activities;DME and/or AE instruction;Paraffin;Cryotherapy;Electrical Stimulation;Fluidtherapy;Passive range of motion;Patient/family education;Contrast Bath;Moist Heat;Iontophoresis    Plan  begin light wrist strengthening w/ weights, begin light work simulation, check on WC visit approval    OT Home Exercise Plan  Pt already provided A/ROM, P/ROM and isometric strengthening HEP previous episode of care    Consulted and Agree with Plan of Care  Patient       Patient will benefit from skilled therapeutic intervention in order to improve the following deficits and impairments:   Body Structure / Function / Physical Skills: Strength, UE functional use, Pain, ROM, IADL       Visit Diagnosis: Pain in right wrist  Muscle weakness (generalized)  Stiffness of right wrist, not elsewhere  classified    Problem List Patient Active Problem List   Diagnosis Date Noted  . OSA (obstructive sleep apnea) 11/11/2013    Carey Bullocks, OTR/L 07/28/2019, 10:01 AM  [place] [address] [place], [place], [address] Phone: [phone]   Fax:  [phone]  Name: Bruce Simmons MRN: [id number] Date of Birth: [birth year]

## 2019-08-05 ENCOUNTER — Other Ambulatory Visit: Payer: Self-pay

## 2019-08-05 ENCOUNTER — Encounter: Payer: Self-pay | Admitting: Occupational Therapy

## 2019-08-05 ENCOUNTER — Ambulatory Visit: Payer: PRIVATE HEALTH INSURANCE | Attending: Occupational Medicine | Admitting: Occupational Therapy

## 2019-08-05 DIAGNOSIS — M6281 Muscle weakness (generalized): Secondary | ICD-10-CM | POA: Insufficient documentation

## 2019-08-05 DIAGNOSIS — M25531 Pain in right wrist: Secondary | ICD-10-CM | POA: Diagnosis not present

## 2019-08-05 DIAGNOSIS — M25631 Stiffness of right wrist, not elsewhere classified: Secondary | ICD-10-CM | POA: Diagnosis present

## 2019-08-05 NOTE — Therapy (Signed)
[place] [address] [place], [place], [address] Phone: [phone]   Fax:  [phone]  Occupational Therapy Treatment  Patient Details  Name: Bruce Simmons MRN: [id number] Date of Birth: 1976-02-04 Referring Provider (OT): Dr. Milly Jakob   Encounter Date: 08/05/2019  OT End of Session - 08/05/19 0728    Visit Number  2    Number of Visits  13    Date for OT Re-Evaluation  09/18/19    Authorization Type  [facility] [employer] 12 OT visits approved with Claim number [id number]    Authorization - Visit Number  2    Authorization - Number of Visits  12    OT Start Time  0722    OT Stop Time  0802    OT Time Calculation (min)  40 min    Activity Tolerance  Patient tolerated treatment well    Behavior During Therapy  [facility] for tasks assessed/performed       Past Medical History:  Diagnosis Date  . GERD (gastroesophageal reflux disease)   . Seasonal allergies   . Sleep apnea   . Sleep-related bruxism     History reviewed. No pertinent surgical history.  There were no vitals filed for this visit.  Subjective Assessment - 08/05/19 0726    Subjective   Pt reports that MD told him not to wear splint unless he really needed it.  It seems to be reactive with weather too.    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 R wrist sprain.   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  No/denies    Pain Onset  More than a month ago          Gentle distraction, light joint mobs to R wrist with reported decr pain per pt with distraction and compression.  Pt issued 2 compression stockinette for light compression due to pain.          OT Education - 08/05/19 1535    Education Details  Updated strengthening HEP (2lb weight, green putty)--see pt instructions    Person(s)  Educated  Patient    Methods  Explanation;Demonstration;Handout;Verbal cues    Comprehension  Verbalized understanding;Returned demonstration;Verbal cues required       OT Short Term Goals - 07/28/19 0953      OT SHORT TERM GOAL #1   Title  Independent with updated strengthening HEP    Time  3    Period  Weeks    Status  New      OT SHORT TERM GOAL #2   Title  Pt will improve ulnar deviation by 5* or greater    Baseline  25*    Time  3    Period  Weeks    Status  New      OT SHORT TERM GOAL #3   Title  Grip strength Rt hand to be 30 lbs or greater    Baseline  20 lbs    Time  3    Period  Weeks    Status  New      OT SHORT TERM GOAL #4   Title  Pt's pain level to remain 3/10 or under w/ light strengthening activities    Time  3    Period  Weeks  Status  New        OT Long Term Goals - 07/28/19 0955      OT LONG TERM GOAL #1   Title  Pt to improve Rt grip strength to 40 lbs or greater    Baseline  20 lbs    Time  6    Period  Weeks    Status  New      OT LONG TERM GOAL #2   Title  Pt to simulate work activities w/ pain less than or equal to 5/10    Time  6    Period  Weeks    Status  New      OT LONG TERM GOAL #3   Title  Pt to improve UD by 10* Rt wrist    Baseline  25*    Time  6    Period  Weeks    Status  New      OT LONG TERM GOAL #4   Title  Pt to oppose thumb to 5th digit Rt hand in prep for functional tasks    Time  6    Period  Weeks    Status  New            Plan - 08/05/19 0735    Clinical Impression Statement  Pt continues to report 5/10 pain and weakness with light strengthening with R wrist.  Continue to recommend light activity and avoid doing more repetitions than recommended due to continued pain.    OT Occupational Profile and History  Problem Focused Assessment - Including review of records relating to presenting problem    Occupational performance deficits (Please refer to evaluation for details):   ADL's;IADL's;Work;Leisure;Social Participation    Body Structure / Function / Physical Skills  Strength;UE functional use;Pain;ROM;IADL    Rehab Potential  Good    Comorbidities Affecting Occupational Performance:  None    Modification or Assistance to Complete Evaluation   No modification of tasks or assist necessary to complete eval    OT Frequency  2x / week    OT Duration  6 weeks   plus eval   OT Treatment/Interventions  Self-care/ADL training;Therapeutic exercise;Splinting;Manual Therapy;Neuromuscular education;Ultrasound;Therapeutic activities;DME and/or AE instruction;Paraffin;Cryotherapy;Electrical Stimulation;Fluidtherapy;Passive range of motion;Patient/family education;Contrast Bath;Moist Heat;Iontophoresis    Plan  begin light work simulation depending on pain, progress with strengthening as able    OT Home Exercise Plan  Pt already provided A/ROM, P/ROM and isometric strengthening HEP previous episode of care    Consulted and Agree with Plan of Care  Patient       Patient will benefit from skilled therapeutic intervention in order to improve the following deficits and impairments:   Body Structure / Function / Physical Skills: Strength, UE functional use, Pain, ROM, IADL       Visit Diagnosis: Pain in right wrist  Muscle weakness (generalized)  Stiffness of right wrist, not elsewhere classified    Problem List Patient Active Problem List   Diagnosis Date Noted  . OSA (obstructive sleep apnea) 11/11/2013    [facility] [place] 08/05/2019, 3:35 PM  [place] [address] [place], [place], [address] Phone: [phone]   Fax:  [phone]  Name: Bruce Simmons MRN: [id number] Date of Birth: [birth year]   Vianne Bulls, OTR/L [facility] [address] [place], [place]  [address] [phone] phone [phone] 08/05/19 3:35 PM

## 2019-08-05 NOTE — Patient Instructions (Addendum)
   Wrist Flexion: Resisted   With right palm up, 1-2 pound weight in hand, bend wrist up. Return slowly. Repeat 15 times per set.  Do 2 sessions per day.  Wrist Extension: Resisted   With right palm down, 1-2 pound weight in hand, bend wrist up. Return slowly. Repeat 15 times per set. Do 2 sessions per day.   Radial Deviation (Resistive)    Holding 1-2, bend wrist upward, with thumb pointing toward you. Hold 3 seconds. Repeat 15 times. Do 2 sessions per day. Activity: Use this movement to pick up a cup.*   Supination / Pronation    Grasp a can/1-2lb weight with involved hand and gently turn can from side to side by turning palm up and down. Keep elbow at side. Repeat 15 times. Do 2 sessions per day.   11. Grip Strengthening (Resistive Putty)   Squeeze putty using thumb and all fingers. Repeat 20-25 times. Do 2 sessions per day.   Extension (Assistive Putty)   Roll putty back and forth, being sure to use all fingertips. Repeat 3 times. Do 1-2 sessions per day.  Then pinch as below.   Palmar Pinch Strengthening (Resistive Putty)   Pinch putty between thumb and each fingertip in turn after rolling out    Finger and Thumb Extension (Resistive Putty)   With thumb and all fingers in center of putty donut, stretch out. Repeat 5-10 times. Do 1-2 sessions per day.          Push thumb into putty toward middle of palm Repeat 5 times. Do 1-2 sessions per day.   FINGERS: Extension (Putty)    Open hand and fingers to flatten putty. 5 reps per set, 2 sets per day

## 2019-08-10 ENCOUNTER — Ambulatory Visit: Payer: PRIVATE HEALTH INSURANCE | Attending: Occupational Medicine | Admitting: Occupational Therapy

## 2019-08-10 DIAGNOSIS — M25631 Stiffness of right wrist, not elsewhere classified: Secondary | ICD-10-CM | POA: Insufficient documentation

## 2019-08-10 DIAGNOSIS — M25531 Pain in right wrist: Secondary | ICD-10-CM | POA: Insufficient documentation

## 2019-08-10 DIAGNOSIS — M6281 Muscle weakness (generalized): Secondary | ICD-10-CM | POA: Insufficient documentation

## 2019-08-11 ENCOUNTER — Ambulatory Visit: Payer: PRIVATE HEALTH INSURANCE | Admitting: Occupational Therapy

## 2019-08-11 ENCOUNTER — Other Ambulatory Visit: Payer: Self-pay

## 2019-08-11 ENCOUNTER — Encounter: Payer: Self-pay | Admitting: Occupational Therapy

## 2019-08-11 DIAGNOSIS — M25631 Stiffness of right wrist, not elsewhere classified: Secondary | ICD-10-CM

## 2019-08-11 DIAGNOSIS — M25531 Pain in right wrist: Secondary | ICD-10-CM | POA: Diagnosis not present

## 2019-08-11 DIAGNOSIS — M6281 Muscle weakness (generalized): Secondary | ICD-10-CM

## 2019-08-11 NOTE — Therapy (Signed)
[facility] [address] [place], [place], [address] Phone: [phone]   Fax:  [phone]  Occupational Therapy Treatment  Patient Details  Name: Bruce Simmons MRN: [id number] Date of Birth: 1975-10-27 Referring Provider (OT): Dr. Milly Jakob   Encounter Date: 08/11/2019  OT End of Session - 08/11/19 1024    Visit Number  3    Number of Visits  13    Date for OT Re-Evaluation  09/18/19    Authorization Type  [employer] OT visits approved with Claim number [id number]    Authorization - Visit Number  3    Authorization - Number of Visits  12    OT Start Time  1022    OT Stop Time  1104    OT Time Calculation (min)  42 min    Activity Tolerance  Patient tolerated treatment well    Behavior During Therapy  [facility] for tasks assessed/performed       Past Medical History:  Diagnosis Date  . GERD (gastroesophageal reflux disease)   . Seasonal allergies   . Sleep apnea   . Sleep-related bruxism     History reviewed. No pertinent surgical history.  There were no vitals filed for this visit.  Subjective Assessment - 08/11/19 1023    Subjective   Pt reports that worst pain in last 2 days was 4/10.  Turned screwdriver lightly this morning and felt a little achy, but not bad.  I want to be able to do more.  Sees MD 08/20/19.    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 R wrist sprain.   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  Yes   none initially   Pain Score  --   0-6/10 during session   Pain Location  Wrist    Pain Orientation  Right    Pain Descriptors / Indicators  Aching;Dull    Pain Type  Acute pain    Pain Onset  More than a month ago    Pain Frequency  Intermittent    Aggravating Factors   use, grip and pull/twist/lifting    Pain Relieving Factors   heat, rest        Forearm gym x 3 for incr wrist AROM in all directions with mod difficulty/discomfort.  Wrist flex/ext, UD/RD x 15 each  Supination/Pronation with exerciser with 1 disc x10  Pt reports that he had to discontinue thumb flex with putty due to pain.  OT agrees to d/c this exercise due to pain.  Wt. Bearing/scrubbing with RUE on table for wrist ext stretch and wrist stability with no pain.  Sliding 10-50lbs on table (on towel) with BUEs to begin to simulate sliding pt with no significant incr in pain.  However, when pt lifted slightly with sliding particularly to the R, pt reports incr discomfort.  (OT assisting on other side).  Pushing cart with 50lbs with no incr pain, pulling cart with 50lbs with RUE to simulate pulling stretcher with mild discomfort.   Attempted to lift box with 30lbs with BUEs with incr pain (6/10); therefore, discontinued.    Picking up blocks with gripper set on level 1 (black spring) for sustained grip strength with mod difficulty.  Light joint mobs/distraction to R wrist for pain relief.  Wrist winder for wrist flex/ext for incr strength (with 2lb wt.) x2       OT Short Term Goals - 07/28/19 0953      OT SHORT TERM GOAL #1   Title  Independent with updated strengthening HEP    Time  3    Period  Weeks    Status  New      OT SHORT TERM GOAL #2   Title  Pt will improve ulnar deviation by 5* or greater    Baseline  25*    Time  3    Period  Weeks    Status  New      OT SHORT TERM GOAL #3   Title  Grip strength Rt hand to be 30 lbs or greater    Baseline  20 lbs    Time  3    Period  Weeks    Status  New      OT SHORT TERM GOAL #4   Title  Pt's pain level to remain 3/10 or under w/ light strengthening activities    Time  3    Period  Weeks    Status  New        OT Long Term Goals - 07/28/19 0955      OT LONG TERM GOAL #1   Title  Pt to improve Rt grip strength to 40 lbs or greater    Baseline  20 lbs    Time  6     Period  Weeks    Status  New      OT LONG TERM GOAL #2   Title  Pt to simulate work activities w/ pain less than or equal to 5/10    Time  6    Period  Weeks    Status  New      OT LONG TERM GOAL #3   Title  Pt to improve UD by 10* Rt wrist    Baseline  25*    Time  6    Period  Weeks    Status  New      OT LONG TERM GOAL #4   Title  Pt to oppose thumb to 5th digit Rt hand in prep for functional tasks    Time  6    Period  Weeks    Status  New            Plan - 08/11/19 1216    Clinical Impression Statement  Pt reports that he feels like his pain and the "popping" has improved.  However, pt continues to feel achy with repetition/incr use of R hand (4/10) and had to d/c thumb flex with putty due to pain.  Pt did tolerate some simulated work activities with decr resistance/wt. today.    OT Occupational Profile and History  Problem Focused Assessment - Including review of records relating to presenting problem    Occupational performance deficits (Please refer to evaluation for details):  ADL's;IADL's;Work;Leisure;Social Participation    Body Structure / Function / Physical Skills  Strength;UE functional use;Pain;ROM;IADL    Rehab Potential  Good    Comorbidities Affecting Occupational Performance:  None    Modification or Assistance to Complete Evaluation   No modification of tasks or assist necessary to complete eval    OT Frequency  2x / week    OT Duration  6 weeks   plus eval   OT Treatment/Interventions  Self-care/ADL training;Therapeutic exercise;Splinting;Manual Therapy;Neuromuscular education;Ultrasound;Therapeutic activities;DME and/or AE instruction;Paraffin;Cryotherapy;Electrical Stimulation;Fluidtherapy;Passive range  of motion;Patient/family education;Contrast Bath;Moist Heat;Iontophoresis    Plan  continue with light work simulation and increasing wt./resistance depending on pain, progress with strengthening as able, note to MD prior to 08/20/19 MD appt    OT  Home Exercise Plan  Pt already provided A/ROM, P/ROM and isometric strengthening HEP previous episode of care    Consulted and Agree with Plan of Care  Patient       Patient will benefit from skilled therapeutic intervention in order to improve the following deficits and impairments:   Body Structure / Function / Physical Skills: Strength, UE functional use, Pain, ROM, IADL       Visit Diagnosis: Pain in right wrist  Muscle weakness (generalized)  Stiffness of right wrist, not elsewhere classified    Problem List Patient Active Problem List   Diagnosis Date Noted  . OSA (obstructive sleep apnea) 11/11/2013    [facility] 08/11/2019, 12:56 PM  [facility] [address] [place], [place], [address] Phone: [phone]   Fax:  [phone]  Name: ANTOLIN VANNORMAN MRN: [id number] Date of Birth: 08-11-1976   Vianne Bulls, OTR/L [facility] [address] [place], [place]  [address] [phone] phone [phone] 08/11/19 12:56 PM

## 2019-08-19 ENCOUNTER — Other Ambulatory Visit: Payer: Self-pay

## 2019-08-19 ENCOUNTER — Ambulatory Visit: Payer: PRIVATE HEALTH INSURANCE | Admitting: Occupational Therapy

## 2019-08-19 DIAGNOSIS — M25631 Stiffness of right wrist, not elsewhere classified: Secondary | ICD-10-CM | POA: Diagnosis not present

## 2019-08-19 DIAGNOSIS — M6281 Muscle weakness (generalized): Secondary | ICD-10-CM

## 2019-08-19 DIAGNOSIS — M25531 Pain in right wrist: Secondary | ICD-10-CM

## 2019-08-19 NOTE — Therapy (Signed)
[facility] [address] [place], [place], [address] Phone: [phone]   Fax:  [phone]  Occupational Therapy Treatment  Patient Details  Name: Bruce Simmons MRN: [id number] Date of Birth: May 26, 1976 Referring Provider (OT): Dr. Milly Jakob   Encounter Date: 08/19/2019  OT End of Session - 08/19/19 0855    Visit Number  4    Number of Visits  13    Date for OT Re-Evaluation  09/18/19    Authorization Type  [employer] 12 OT visits approved with Claim number [id number]    Authorization - Visit Number  4    Authorization - Number of Visits  12    OT Start Time  0800    OT Stop Time  0848    OT Time Calculation (min)  48 min    Activity Tolerance  Patient tolerated treatment well    Behavior During Therapy  [facility] for tasks assessed/performed       Past Medical History:  Diagnosis Date  . GERD (gastroesophageal reflux disease)   . Seasonal allergies   . Sleep apnea   . Sleep-related bruxism     No past surgical history on file.  There were no vitals filed for this visit.  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 R wrist sprain.   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  Yes    Pain Score  4     Pain Location  Wrist    Pain Orientation  Right    Pain Descriptors / Indicators  Aching;Dull    Pain Type  Acute pain    Pain Onset  More than a month ago    Pain Frequency  Intermittent    Aggravating Factors   pull/twist/lifting, cold and/or rainy weather    Pain Relieving Factors  heat, rest       A/ROM wrist flex/ext, RD/UD x 15 reps each as warm up. Grip Rt = 40 lbs.  Fluidotherapy x 10 min to decrease pain.  Continued simulated work tasks including: sliding up to 50 lbs w/ no increase in pain, however when  lifting and sliding, pt's pain increased. Pt pushing heavy cart w/ no problems, but unable to pull cart in simulated work pattern w/ Rt hand. Pt lifting 30 lb box w/ different technique w/ no pain, however pt reports he would not be able to do this way w/ stretcher. Emphasized importance of proper body mechanics w/ lifting and pulling including use of whole body and larger muscle groups.  Sup/pron w/ heavy hammer x 10 reps each way.  Note sent to MD re: progress, pain level, and current concerns/difficulties.                     OT Short Term Goals - 07/28/19 0953      OT SHORT TERM GOAL #1   Title  Independent with updated strengthening HEP    Time  3    Period  Weeks    Status  New      OT SHORT TERM GOAL #2   Title  Pt will improve ulnar deviation by 5* or greater    Baseline  25*    Time  3  Period  Weeks    Status  New      OT SHORT TERM GOAL #3   Title  Grip strength Rt hand to be 30 lbs or greater    Baseline  20 lbs    Time  3    Period  Weeks    Status  New      OT SHORT TERM GOAL #4   Title  Pt's pain level to remain 3/10 or under w/ light strengthening activities    Time  3    Period  Weeks    Status  New        OT Long Term Goals - 07/28/19 0955      OT LONG TERM GOAL #1   Title  Pt to improve Rt grip strength to 40 lbs or greater    Baseline  20 lbs    Time  6    Period  Weeks    Status  New      OT LONG TERM GOAL #2   Title  Pt to simulate work activities w/ pain less than or equal to 5/10    Time  6    Period  Weeks    Status  New      OT LONG TERM GOAL #3   Title  Pt to improve UD by 10* Rt wrist    Baseline  25*    Time  6    Period  Weeks    Status  New      OT LONG TERM GOAL #4   Title  Pt to oppose thumb to 5th digit Rt hand in prep for functional tasks    Time  6    Period  Weeks    Status  New            Plan - 08/19/19 0857    Clinical Impression Statement  Pt appears to have most pain w/ lifting and  pulling activities. Pain 4/10 but increases to 6/10 w/ these activities    Occupational performance deficits (Please refer to evaluation for details):  ADL's;IADL's;Work;Leisure;Social Participation    Body Structure / Function / Physical Skills  Strength;UE functional use;Pain;ROM;IADL    Rehab Potential  Good    Comorbidities Affecting Occupational Performance:  None    OT Frequency  2x / week    OT Duration  6 weeks    OT Treatment/Interventions  Self-care/ADL training;Therapeutic exercise;Splinting;Manual Therapy;Neuromuscular education;Ultrasound;Therapeutic activities;DME and/or AE instruction;Paraffin;Cryotherapy;Electrical Stimulation;Fluidtherapy;Passive range of motion;Patient/family education;Contrast Bath;Moist Heat;Iontophoresis    Plan  continue with light work simulation and increasing wt./resistance depending on pain, progress with strengthening as able, begin checking STG's   OT Home Exercise Plan  Pt already provided A/ROM, P/ROM and isometric strengthening HEP previous episode of care       Patient will benefit from skilled therapeutic intervention in order to improve the following deficits and impairments:   Body Structure / Function / Physical Skills: Strength, UE functional use, Pain, ROM, IADL       Visit Diagnosis: Pain in right wrist  Muscle weakness (generalized)    Problem List Patient Active Problem List   Diagnosis Date Noted  . OSA (obstructive sleep apnea) 11/11/2013    Carey Bullocks, OTR/L 08/19/2019, 8:59 AM  [facility] [address] [place], [place], [address] Phone: [phone]   Fax:  [phone]  Name: Bruce Simmons MRN: [id number] Date of Birth: [birth year]

## 2019-08-25 ENCOUNTER — Ambulatory Visit: Payer: PRIVATE HEALTH INSURANCE | Admitting: Occupational Therapy

## 2019-09-08 ENCOUNTER — Ambulatory Visit: Payer: PRIVATE HEALTH INSURANCE | Admitting: Occupational Therapy

## 2019-09-11 ENCOUNTER — Ambulatory Visit: Payer: PRIVATE HEALTH INSURANCE | Admitting: Occupational Therapy

## 2019-09-29 DIAGNOSIS — G4733 Obstructive sleep apnea (adult) (pediatric): Secondary | ICD-10-CM | POA: Diagnosis not present

## 2019-10-07 NOTE — Therapy (Signed)
[facility] [address] [place], [place], [address] Phone: [phone]   Fax:  [phone]  Patient Details  Name: Bruce Simmons MRN: [id number] Date of Birth: [birth year] Referring Provider:  Dr. Grandville Silos Encounter Date: 10/07/2019  OCCUPATIONAL THERAPY DISCHARGE SUMMARY  Visits from Start of Care: 4  Current functional level related to goals / functional outcomes:  Unknown secondary to not returning after 08/19/19.    Remaining deficits: Pain strength   Education / Equipment: HEP's, task modifications/adaptive strategies for work related tasks  Plan: Patient agrees to discharge.  Patient goals were partially met. Patient is being discharged due to the physician's request.  Case manager called on 09/17/19 and stated that pt saw MD and MD has d/c him from care and reports he no longer needed therapy?????         Carey Bullocks, OTR/L 10/07/2019, 9:09 AM  [facility] [address] [place], [place], [address] Phone: [phone]   Fax:  [phone]

## 2019-12-06 IMAGING — DX RIGHT WRIST - COMPLETE 3+ VIEW
4 series · 4 of 4 positions shown · non-contrast
Comparison: None.

CLINICAL DATA: RIGHT wrist pain, recent fall.

EXAM:
RIGHT WRIST - COMPLETE 3+ VIEW

[wrist pa]
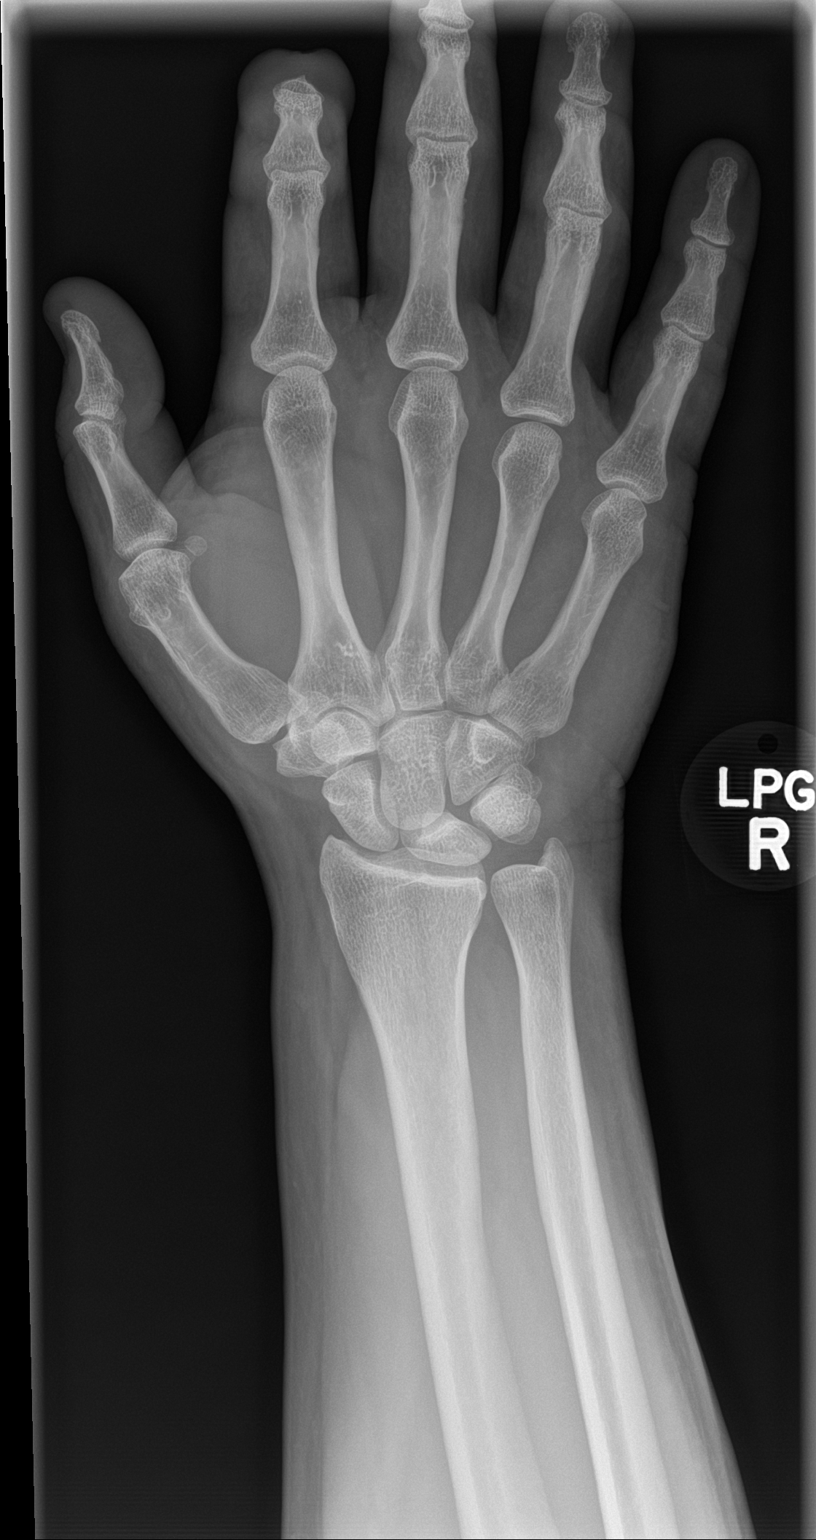

[wrist obl]
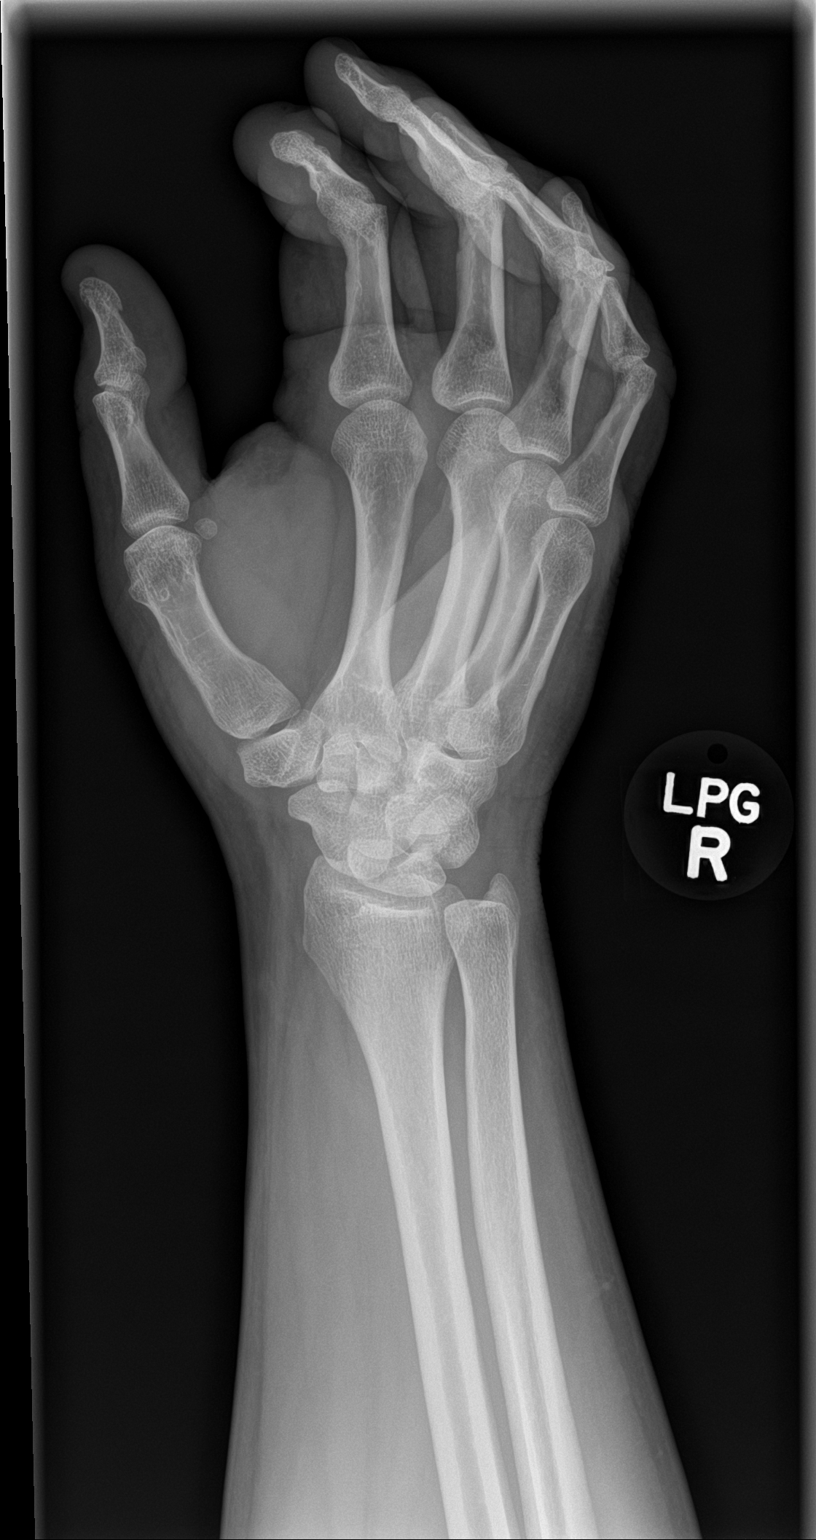

[wrist lat]
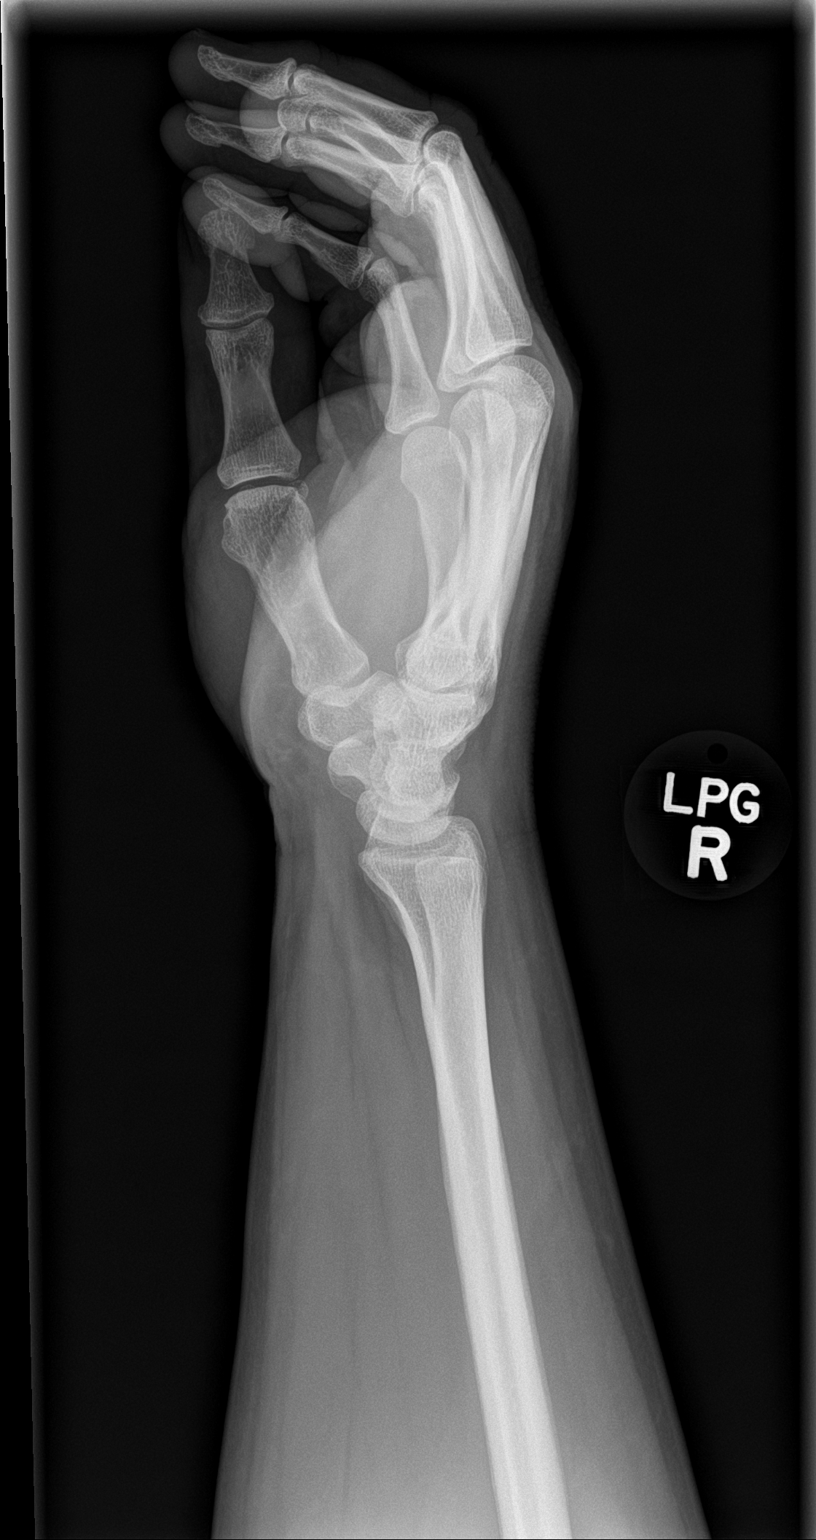

[wrist navicular]
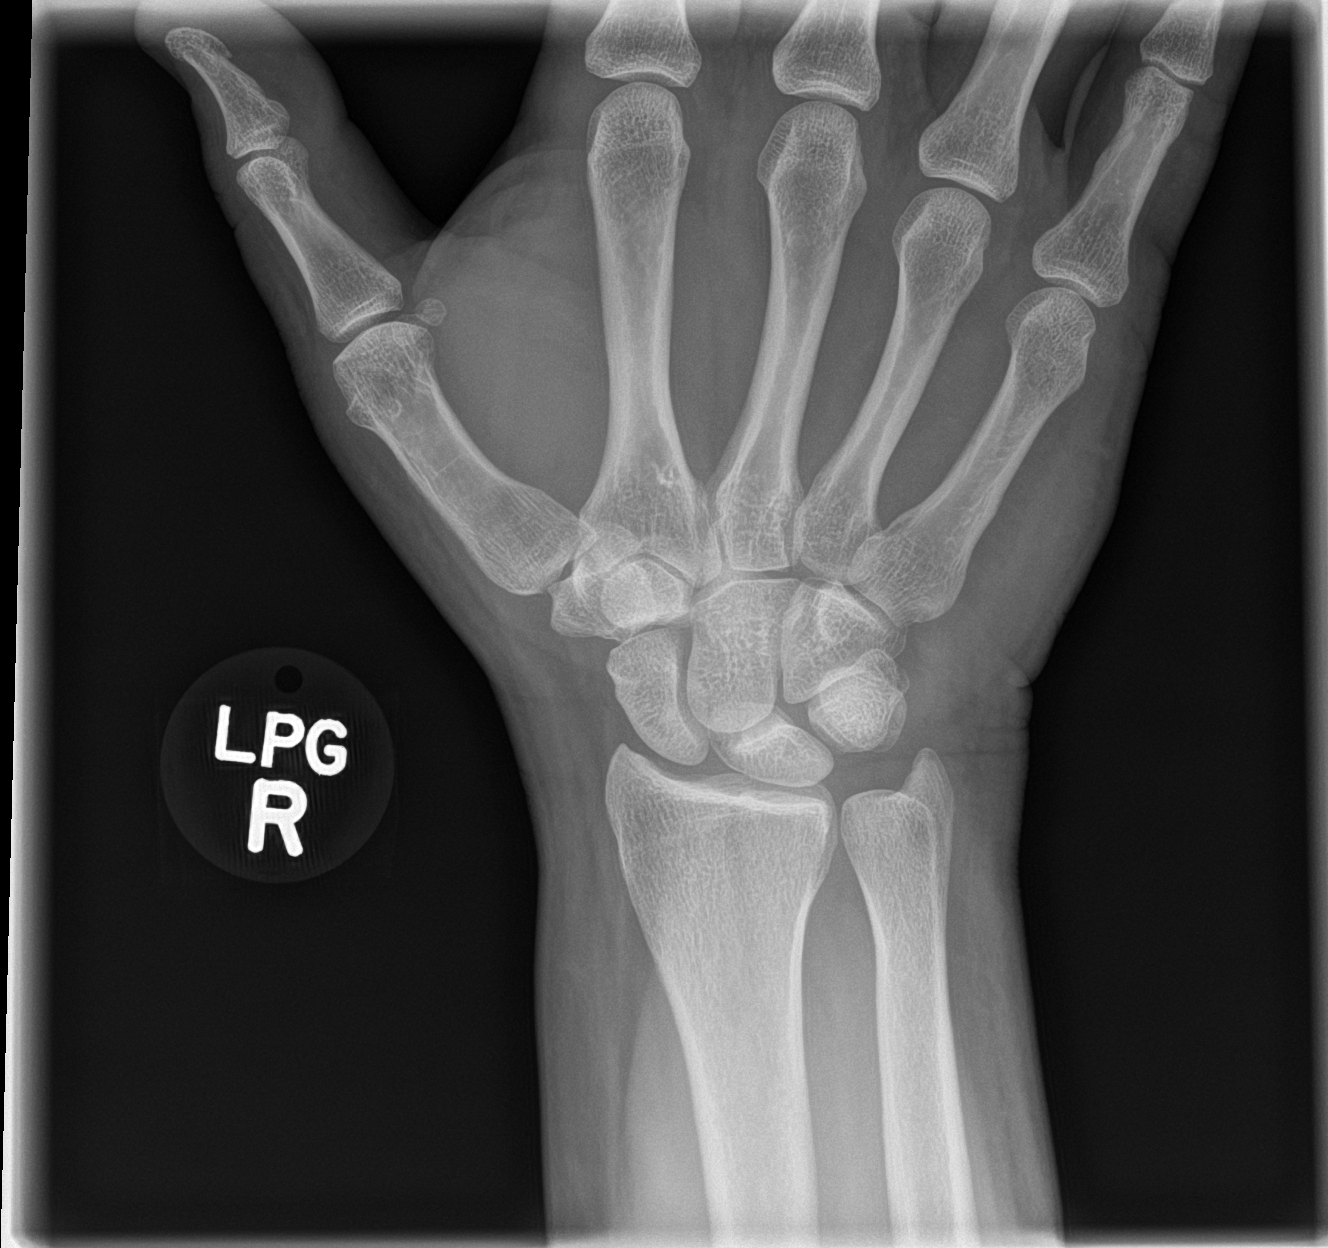

[4 of 4 positions shown; findings below may reference images not displayed]

FINDINGS: Osseous alignment is normal. Bone mineralization is normal. No
fracture line or displaced fracture fragment seen. Soft tissues
about the wrist are unremarkable.
IMPRESSION: Negative.

## 2019-12-22 DIAGNOSIS — G4733 Obstructive sleep apnea (adult) (pediatric): Secondary | ICD-10-CM | POA: Diagnosis not present

## 2020-03-22 DIAGNOSIS — G4733 Obstructive sleep apnea (adult) (pediatric): Secondary | ICD-10-CM | POA: Diagnosis not present

## 2020-05-12 ENCOUNTER — Ambulatory Visit (HOSPITAL_COMMUNITY)
Admission: RE | Admit: 2020-05-12 | Discharge: 2020-05-12 | Disposition: A | Discharge status: Home / Self Care | Payer: 59 | Source: Ambulatory Visit | Attending: Pulmonary Disease | Admitting: Pulmonary Disease

## 2020-05-12 ENCOUNTER — Other Ambulatory Visit: Payer: Self-pay | Admitting: Oncology

## 2020-05-12 ENCOUNTER — Encounter: Payer: Self-pay | Admitting: Oncology

## 2020-05-12 DIAGNOSIS — U071 COVID-19: Secondary | ICD-10-CM | POA: Diagnosis not present

## 2020-05-12 MED ORDER — SODIUM CHLORIDE 0.9 % IV SOLN
1200.0000 mg | Freq: Once | INTRAVENOUS | Status: AC
  Administered 2020-05-12: 1200 mg via INTRAVENOUS
  Filled 2020-05-12: qty 10

## 2020-05-12 MED ORDER — DIPHENHYDRAMINE HCL 50 MG/ML IJ SOLN: 50.0000 mg | Freq: Once | INTRAMUSCULAR | Status: DC | PRN

## 2020-05-12 MED ORDER — ALBUTEROL SULFATE HFA 108 (90 BASE) MCG/ACT IN AERS: 2.0000 | INHALATION_SPRAY | Freq: Once | RESPIRATORY_TRACT | Status: DC | PRN

## 2020-05-12 MED ORDER — FAMOTIDINE IN NACL 20-0.9 MG/50ML-% IV SOLN: 20.0000 mg | Freq: Once | INTRAVENOUS | Status: DC | PRN

## 2020-05-12 MED ORDER — EPINEPHRINE 0.3 MG/0.3ML IJ SOAJ: 0.3000 mg | Freq: Once | INTRAMUSCULAR | Status: DC | PRN

## 2020-05-12 MED ORDER — SODIUM CHLORIDE 0.9 % IV SOLN: INTRAVENOUS | Status: DC | PRN

## 2020-05-12 MED ORDER — METHYLPREDNISOLONE SODIUM SUCC 125 MG IJ SOLR: 125.0000 mg | Freq: Once | INTRAMUSCULAR | Status: DC | PRN

## 2020-05-12 NOTE — Discharge Instructions (Signed)

## 2020-05-12 NOTE — Progress Notes (Signed)
  Diagnosis: COVID-19  Physician: Dr. Joya Gaskins  Procedure: [facility] Infusion [facility] Med: casirivimab\imdevimab infusion - Provided patient with casirivimab\imdevimab fact sheet for patients, parents and caregivers prior to infusion.  Complications: No immediate complications noted.  Discharge: Discharged home   Tia Masker 05/12/2020

## 2020-05-12 NOTE — Progress Notes (Signed)
I connected by phone with Bruce Simmons to discuss the potential use of an new treatment for mild to moderate COVID-19 viral infection in non-hospitalized patients.   This patient is a age/sex that meets the FDA criteria for Emergency Use Authorization of casirivimab\imdevimab.  Has a (+) direct SARS-CoV-2 viral test result 1. Has mild or moderate COVID-19  2. Is ? 45 years of age and weighs ? 40 kg 3. Is NOT hospitalized due to COVID-19 4. Is NOT requiring oxygen therapy or requiring an increase in baseline oxygen flow rate due to COVID-19 5. Is within 10 days of symptom onset 6. Has at least one of the high risk factor(s) for progression to severe COVID-19 and/or hospitalization as defined in EUA. ? Specific high risk criteria :Obesity   Symptom onset 05/10/2020   I have spoken and communicated the following to the patient or parent/caregiver:   1. FDA has authorized the emergency use of casirivimab\imdevimab for the treatment of mild to moderate COVID-19 in adults and pediatric patients with positive results of direct SARS-CoV-2 viral testing who are 48 years of age and older weighing at least 40 kg, and who are at high risk for progressing to severe COVID-19 and/or hospitalization.   2. The significant known and potential risks and benefits of casirivimab\imdevimab, and the extent to which such potential risks and benefits are unknown.   3. Information on available alternative treatments and the risks and benefits of those alternatives, including clinical trials.   4. Patients treated with casirivimab\imdevimab should continue to self-isolate and use infection control measures (e.g., wear mask, isolate, social distance, avoid sharing personal items, clean and disinfect "high touch" surfaces, and frequent handwashing) according to CDC guidelines.    5. The patient or parent/caregiver has the option to accept or refuse casirivimab\imdevimab .   After reviewing this information with the patient,  The patient agreed to proceed with receiving casirivimab\imdevimab infusion and will be provided a copy of the Fact sheet prior to receiving the infusion.Rulon Abide, AGNP-C [phone] ([place])

## 2020-05-13 DIAGNOSIS — U071 COVID-19: Secondary | ICD-10-CM | POA: Diagnosis not present

## 2020-05-17 ENCOUNTER — Other Ambulatory Visit (HOSPITAL_COMMUNITY): Payer: Self-pay

## 2020-06-14 DIAGNOSIS — G4733 Obstructive sleep apnea (adult) (pediatric): Secondary | ICD-10-CM | POA: Diagnosis not present

## 2020-09-13 DIAGNOSIS — G4733 Obstructive sleep apnea (adult) (pediatric): Secondary | ICD-10-CM | POA: Diagnosis not present

## 2020-10-04 DIAGNOSIS — Z1211 Encounter for screening for malignant neoplasm of colon: Secondary | ICD-10-CM | POA: Diagnosis not present

## 2020-10-04 DIAGNOSIS — G4730 Sleep apnea, unspecified: Secondary | ICD-10-CM | POA: Diagnosis not present

## 2020-10-04 DIAGNOSIS — K219 Gastro-esophageal reflux disease without esophagitis: Secondary | ICD-10-CM | POA: Diagnosis not present

## 2020-10-04 DIAGNOSIS — Z0000 Encounter for general adult medical examination without abnormal findings: Secondary | ICD-10-CM | POA: Diagnosis not present

## 2020-10-04 DIAGNOSIS — Z0184 Encounter for antibody response examination: Secondary | ICD-10-CM | POA: Diagnosis not present

## 2020-10-04 DIAGNOSIS — E669 Obesity, unspecified: Secondary | ICD-10-CM | POA: Diagnosis not present

## 2020-12-06 DIAGNOSIS — G4733 Obstructive sleep apnea (adult) (pediatric): Secondary | ICD-10-CM | POA: Diagnosis not present

## 2021-02-28 DIAGNOSIS — G4733 Obstructive sleep apnea (adult) (pediatric): Secondary | ICD-10-CM | POA: Diagnosis not present

## 2021-03-31 ENCOUNTER — Other Ambulatory Visit (HOSPITAL_COMMUNITY): Payer: Self-pay

## 2021-03-31 MED ORDER — PEG 3350-KCL-NA BICARB-NACL 420 G PO SOLR
ORAL | 0 refills | Status: AC
Start: 2021-03-30 — End: ?
  Filled 2021-03-31 – 2021-04-13 (×2): qty 4000, 1d supply, fill #0

## 2021-04-10 ENCOUNTER — Other Ambulatory Visit (HOSPITAL_COMMUNITY): Payer: Self-pay

## 2021-04-13 ENCOUNTER — Other Ambulatory Visit (HOSPITAL_COMMUNITY): Payer: Self-pay

## 2021-04-17 DIAGNOSIS — Z800 Family history of malignant neoplasm of digestive organs: Secondary | ICD-10-CM | POA: Diagnosis not present

## 2021-04-17 DIAGNOSIS — D123 Benign neoplasm of transverse colon: Secondary | ICD-10-CM | POA: Diagnosis not present

## 2021-04-17 DIAGNOSIS — Z1211 Encounter for screening for malignant neoplasm of colon: Secondary | ICD-10-CM | POA: Diagnosis not present

## 2021-04-19 ENCOUNTER — Other Ambulatory Visit (HOSPITAL_COMMUNITY): Payer: Self-pay

## 2021-04-19 MED ORDER — CARESTART COVID-19 HOME TEST VI KIT
PACK | 0 refills | Status: DC
  Filled 2021-04-19: qty 2, 2d supply, fill #0

## 2021-05-24 DIAGNOSIS — G4733 Obstructive sleep apnea (adult) (pediatric): Secondary | ICD-10-CM | POA: Diagnosis not present

## 2021-07-07 DIAGNOSIS — M25511 Pain in right shoulder: Secondary | ICD-10-CM | POA: Diagnosis not present

## 2021-07-07 DIAGNOSIS — M898X1 Other specified disorders of bone, shoulder: Secondary | ICD-10-CM | POA: Diagnosis not present

## 2021-07-18 ENCOUNTER — Other Ambulatory Visit (HOSPITAL_COMMUNITY): Payer: Self-pay

## 2021-07-18 MED ORDER — CARESTART COVID-19 HOME TEST VI KIT
PACK | 0 refills | Status: DC
  Filled 2021-07-18: qty 4, 4d supply, fill #0

## 2021-08-16 DIAGNOSIS — G4733 Obstructive sleep apnea (adult) (pediatric): Secondary | ICD-10-CM | POA: Diagnosis not present

## 2021-08-18 DIAGNOSIS — U071 COVID-19: Secondary | ICD-10-CM | POA: Diagnosis not present

## 2021-08-23 ENCOUNTER — Other Ambulatory Visit (HOSPITAL_COMMUNITY): Payer: Self-pay

## 2021-08-23 MED ORDER — CARESTART COVID-19 HOME TEST VI KIT
PACK | 0 refills | Status: AC
  Filled 2021-08-23: qty 4, 4d supply, fill #0

## 2021-09-06 DIAGNOSIS — M25511 Pain in right shoulder: Secondary | ICD-10-CM | POA: Diagnosis not present

## 2021-09-06 DIAGNOSIS — M542 Cervicalgia: Secondary | ICD-10-CM | POA: Diagnosis not present

## 2021-09-07 DIAGNOSIS — F411 Generalized anxiety disorder: Secondary | ICD-10-CM | POA: Diagnosis not present

## 2021-09-20 DIAGNOSIS — K130 Diseases of lips: Secondary | ICD-10-CM | POA: Diagnosis not present

## 2021-09-20 DIAGNOSIS — D100 Benign neoplasm of lip: Secondary | ICD-10-CM | POA: Diagnosis not present

## 2021-10-10 DIAGNOSIS — Z0000 Encounter for general adult medical examination without abnormal findings: Secondary | ICD-10-CM | POA: Diagnosis not present

## 2021-10-10 DIAGNOSIS — E6609 Other obesity due to excess calories: Secondary | ICD-10-CM | POA: Diagnosis not present

## 2021-10-10 DIAGNOSIS — Z6841 Body Mass Index (BMI) 40.0 and over, adult: Secondary | ICD-10-CM | POA: Diagnosis not present

## 2021-10-10 DIAGNOSIS — F411 Generalized anxiety disorder: Secondary | ICD-10-CM | POA: Diagnosis not present

## 2021-10-10 DIAGNOSIS — Z8349 Family history of other endocrine, nutritional and metabolic diseases: Secondary | ICD-10-CM | POA: Diagnosis not present

## 2021-10-10 DIAGNOSIS — N5201 Erectile dysfunction due to arterial insufficiency: Secondary | ICD-10-CM | POA: Diagnosis not present

## 2021-10-10 DIAGNOSIS — Z23 Encounter for immunization: Secondary | ICD-10-CM | POA: Diagnosis not present

## 2021-10-10 DIAGNOSIS — Z6835 Body mass index (BMI) 35.0-35.9, adult: Secondary | ICD-10-CM | POA: Diagnosis not present

## 2021-10-10 DIAGNOSIS — R7989 Other specified abnormal findings of blood chemistry: Secondary | ICD-10-CM | POA: Diagnosis not present

## 2021-11-08 ENCOUNTER — Other Ambulatory Visit (HOSPITAL_COMMUNITY): Payer: Self-pay

## 2021-11-08 DIAGNOSIS — G4733 Obstructive sleep apnea (adult) (pediatric): Secondary | ICD-10-CM | POA: Diagnosis not present

## 2021-11-09 ENCOUNTER — Other Ambulatory Visit (HOSPITAL_COMMUNITY): Payer: Self-pay

## 2021-11-09 MED ORDER — ESCITALOPRAM OXALATE 5 MG PO TABS
ORAL_TABLET | ORAL | 4 refills | Status: AC
  Filled 2021-11-09: qty 60, 30d supply, fill #0
  Filled 2022-02-08: qty 60, 30d supply, fill #1
  Filled 2022-04-30: qty 60, 30d supply, fill #2

## 2022-01-31 DIAGNOSIS — G4733 Obstructive sleep apnea (adult) (pediatric): Secondary | ICD-10-CM | POA: Diagnosis not present

## 2022-02-08 ENCOUNTER — Other Ambulatory Visit (HOSPITAL_COMMUNITY): Payer: Self-pay

## 2022-04-25 DIAGNOSIS — G4733 Obstructive sleep apnea (adult) (pediatric): Secondary | ICD-10-CM | POA: Diagnosis not present

## 2022-04-30 ENCOUNTER — Other Ambulatory Visit (HOSPITAL_COMMUNITY): Payer: Self-pay

## 2022-06-20 ENCOUNTER — Other Ambulatory Visit (HOSPITAL_COMMUNITY): Payer: Self-pay

## 2022-06-20 DIAGNOSIS — F411 Generalized anxiety disorder: Secondary | ICD-10-CM | POA: Diagnosis not present

## 2022-06-20 DIAGNOSIS — Z6841 Body Mass Index (BMI) 40.0 and over, adult: Secondary | ICD-10-CM | POA: Diagnosis not present

## 2022-06-20 DIAGNOSIS — G4733 Obstructive sleep apnea (adult) (pediatric): Secondary | ICD-10-CM | POA: Diagnosis not present

## 2022-06-20 MED ORDER — ESCITALOPRAM OXALATE 5 MG PO TABS
5.0000 mg | ORAL_TABLET | Freq: Every day | ORAL | 4 refills | Status: AC
  Filled 2022-06-20 – 2022-09-04 (×2): qty 90, 90d supply, fill #0
  Filled 2023-01-31: qty 90, 90d supply, fill #1

## 2022-06-29 ENCOUNTER — Other Ambulatory Visit (HOSPITAL_COMMUNITY): Payer: Self-pay

## 2022-07-25 DIAGNOSIS — G4733 Obstructive sleep apnea (adult) (pediatric): Secondary | ICD-10-CM | POA: Diagnosis not present

## 2022-09-04 ENCOUNTER — Other Ambulatory Visit (HOSPITAL_COMMUNITY): Payer: Self-pay

## 2023-01-23 DIAGNOSIS — G4733 Obstructive sleep apnea (adult) (pediatric): Secondary | ICD-10-CM | POA: Diagnosis not present

## 2023-01-31 ENCOUNTER — Other Ambulatory Visit: Payer: Self-pay

## 2023-02-01 ENCOUNTER — Encounter: Payer: Self-pay | Admitting: Pharmacist

## 2023-02-01 ENCOUNTER — Other Ambulatory Visit: Payer: Self-pay

## 2023-02-05 ENCOUNTER — Ambulatory Visit: Payer: PRIVATE HEALTH INSURANCE | Admitting: Rehabilitative and Restorative Service Providers"

## 2023-02-05 ENCOUNTER — Other Ambulatory Visit: Payer: Self-pay

## 2023-02-05 ENCOUNTER — Ambulatory Visit: Payer: PRIVATE HEALTH INSURANCE | Attending: Orthopedic Surgery | Admitting: Physical Therapy

## 2023-02-05 DIAGNOSIS — M62838 Other muscle spasm: Secondary | ICD-10-CM | POA: Diagnosis present

## 2023-02-05 DIAGNOSIS — R293 Abnormal posture: Secondary | ICD-10-CM | POA: Diagnosis present

## 2023-02-05 DIAGNOSIS — M6281 Muscle weakness (generalized): Secondary | ICD-10-CM | POA: Insufficient documentation

## 2023-02-05 DIAGNOSIS — M25512 Pain in left shoulder: Secondary | ICD-10-CM | POA: Diagnosis present

## 2023-02-05 DIAGNOSIS — R279 Unspecified lack of coordination: Secondary | ICD-10-CM | POA: Insufficient documentation

## 2023-02-05 NOTE — Therapy (Signed)
OUTPATIENT PHYSICAL THERAPY SHOULDER EVALUATION   Patient Name: Bruce Simmons MRN: [id number] DOB:May 18, 1976, 47 y.o., male Today's Date: 02/05/2023  END OF SESSION:  PT End of Session - 02/05/23 1451     Visit Number 1    Date for PT Re-Evaluation 05/08/23    Authorization Type [facility] [employer]    PT Start Time 1450    PT Stop Time 1528    PT Time Calculation (min) 38 min    Activity Tolerance Patient tolerated treatment well    Behavior During Therapy WFL for tasks assessed/performed             Past Medical History:  Diagnosis Date   GERD (gastroesophageal reflux disease)    Seasonal allergies    Sleep apnea    Sleep-related bruxism    No past surgical history on file. Patient Active Problem List   Diagnosis Date Noted   OSA (obstructive sleep apnea) 11/11/2013    PCP: Erskine Speed, NP  REFERRING PROVIDER: Jones Broom, MD  REFERRING DIAG: Left shoulder pain  THERAPY DIAG:  Muscle weakness (generalized)  Abnormal posture  Unspecified lack of coordination  Acute pain of left shoulder  Other muscle spasm  Rationale for Evaluation and Treatment: Rehabilitation  ONSET DATE: 8 weeks ago  SUBJECTIVE:                                                                                                                                                                                      SUBJECTIVE STATEMENT: 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 No pops/clicks. X-ray (-). Had an injection and thinks pain is now worse 2 weeks ago. Now having pain with sleeping, bringing out to the side with worst but painful in all directions.  Hand dominance: Right  PERTINENT HISTORY: Nothing male note   PAIN:  Are you having pain? Yes: NPRS scale: 1 at lowest and gets up to 8/10 Pain location: Lt anterior shoulder  Pain description: sharp lasting as long as he's in that  motion  Aggravating factors: shoulder abduction, reaching over head, sometimes putting shirt. Relieving factors: rest, ice, ibuprofen   PRECAUTIONS: Other: no push/pull/lift over 10#  WEIGHT BEARING RESTRICTIONS: No  FALLS:  Has patient fallen in last 6 months? No  LIVING ENVIRONMENT: Lives with: lives with their family Lives in: House/apartment   OCCUPATION: EMT  PLOF: Independent  PATIENT GOALS:to have less pain  NEXT MD VISIT: 02/21/23  OBJECTIVE:   DIAGNOSTIC FINDINGS:  X-ray (-)  PATIENT SURVEYS:  FOTO:    COGNITION: Overall cognitive status: Within functional limits for tasks  assessed     SENSATION: WFL  POSTURE: Forward head, rounded shoulders, increased kyphosis   UPPER EXTREMITY ROM:  All right WFL without pain LT: shoulder abduction limited by 75%, flexion limited by 25%, IR 25% limited, extension and ER WFL - all mobility with pain abduction most painful    UPPER EXTREMITY MMT: Rt all 5/5  MMT Right eval Left eval  Shoulder flexion  4/5 within range though limited due to pain   Shoulder extension    Shoulder abduction  Unable to hold resistance and very limited due to pain  Shoulder adduction  3+/5  Shoulder internal rotation  Unable to hold resistance and very limited due to pain  Shoulder external rotation  3+/5  Middle trapezius    Lower trapezius    Elbow flexion    Elbow extension    Wrist flexion    Wrist extension    Wrist ulnar deviation    Wrist radial deviation    Wrist pronation    Wrist supination    Grip strength (lbs)  5/5  (Blank rows = not tested)  SHOULDER SPECIAL TESTS: Impingement tests: Neer impingement test: negative, Hawkins/Kennedy impingement test: positive , and Painful arc test: positive  SLAP lesions: Crank test: negative, Biceps load test: negative, and Clunk test: negative Rotator cuff assessment: Empty can test: negative, Full can test: negative, and Infraspinatus test: negative AC compression (+) pain     PALPATION:  TTP across anterior shoulder, no TTP throughout neck or posterior lateral sides of shoulder joint   TODAY'S TREATMENT:                                                                                                                                         DATE:  Examination completed, findings reviewed, pt educated on POC, HEP. Pt motivated to participate in PT and agreeable to attempt recommendations.    PATIENT EDUCATION: Education details: [id number] Person educated: Patient Education method: Programmer, multimedia, Demonstration, Actor cues, Verbal cues, and Handouts Education comprehension: verbalized understanding, returned demonstration, verbal cues required, tactile cues required, and needs further education  HOME EXERCISE PROGRAM: [id number]  ASSESSMENT:  CLINICAL IMPRESSION: Patient is a 47 y.o. male  who was seen today for physical therapy evaluation and treatment for Lt shoulder pain at anterior shoulder, pain is isolated to this area and pt able to pin point it. Pt has decreased ROM at Lt shoulder more so at abduction, but all other directions painful at same anterior shoulder point. Pt had pain with compression at [facility] joint. Pt demonstrates poor posture and had difficulty correcting with cues and maintaining due to fatigue.Pt very motivated to participate and improve pain levels. Pt would benefit from additional PT to further address deficits.    OBJECTIVE IMPAIRMENTS: decreased activity tolerance, decreased endurance, decreased ROM, decreased strength, increased muscle spasms, impaired flexibility, improper body mechanics, postural dysfunction, and pain.   ACTIVITY LIMITATIONS:  carrying, lifting, bathing, reach over head, and hygiene/grooming  PARTICIPATION LIMITATIONS: community activity, occupation, and yard work  PERSONAL FACTORS: Fitness and Time since onset of injury/illness/exacerbation are also affecting patient's functional outcome.   REHAB POTENTIAL:  Good  CLINICAL DECISION MAKING: Stable/uncomplicated  EVALUATION COMPLEXITY: Low   GOALS: Goals reviewed with patient? Yes  SHORT TERM GOALS: Target date: 03/05/23  Pt to be I with HEP.  Baseline: Goal status: INITIAL  2.  Pt to demonstrate improved posture for at least 30 mins to decrease pain and tension at shoulder.  Baseline:  Goal status: INITIAL  3.  Pt will report 25% reduction of pain due to improvements in posture, strength, and muscle length  Baseline: 8/10 Goal status: INITIAL   LONG TERM GOALS: Target date: 05/08/23  Pt to be I with advanced HEP.  Baseline:  Goal status: INITIAL  2.  Pt to demonstrate improved ROM at Lt shoulder to full range with abduction with no more than 2 strength grade increase in pain for improved mobility at work  Baseline:  Goal status: INITIAL  3.  Pt will report 50% reduction of pain due to improvements in posture, strength, and muscle length  Baseline:  Goal status: INITIAL  4.  Pt to demonstrate ability to lift at least 50# without increase of pain more than 2 strength grades to return to lifting batteries for ambulances.   Baseline:  Goal status: INITIAL  5.  Complete FOTO at second session.  Baseline:  Goal status: INITIAL    PLAN:  PT FREQUENCY: 2x/week  PT DURATION: 8 weeks  PLANNED INTERVENTIONS: Therapeutic exercises, Therapeutic activity, Neuromuscular re-education, Patient/Family education, Self Care, Joint mobilization, Joint manipulation, DME instructions, Aquatic Therapy, Dry Needling, Electrical stimulation, Spinal mobilization, Cryotherapy, Moist heat, Taping, Vasopneumatic device, Traction, Ionotophoresis 4mg /ml Dexamethasone, and Manual therapy  PLAN FOR NEXT SESSION: posture training, manual at shoulder if needed, modalities for pain management as needed, strengthening shoulder and posture  Otelia Sergeant, PT, DPT 06/04/245:04 PM

## 2023-02-06 ENCOUNTER — Other Ambulatory Visit: Payer: Self-pay

## 2023-02-18 NOTE — Therapy (Signed)
OUTPATIENT PHYSICAL THERAPY SHOULDER EVALUATION   Patient Name: Bruce Simmons MRN: [id number] DOB:[birth year], 47 y.o., male Today's Date: 02/19/2023  END OF SESSION:  PT End of Session - 02/19/23 1533     Visit Number 2    Date for PT Re-Evaluation 05/08/23    Authorization Type cone aetna    PT Start Time 1530    PT Stop Time 1615    PT Time Calculation (min) 45 min    Activity Tolerance Patient tolerated treatment well    Behavior During Therapy [facility] for tasks assessed/performed              Past Medical History:  Diagnosis Date   GERD (gastroesophageal reflux disease)    Seasonal allergies    Sleep apnea    Sleep-related bruxism    History reviewed. No pertinent surgical history. Patient Active Problem List   Diagnosis Date Noted   OSA (obstructive sleep apnea) 11/11/2013    PCP: Erskine Speed, NP  REFERRING PROVIDER: Jones Broom, MD  REFERRING DIAG: Left shoulder pain  THERAPY DIAG:  Muscle weakness (generalized)  Abnormal posture  Unspecified lack of coordination  Acute pain of left shoulder  Other muscle spasm  Rationale for Evaluation and Treatment: Rehabilitation  ONSET DATE: 8 weeks ago  SUBJECTIVE:                                                                                                                                                                                      SUBJECTIVE STATEMENT: Pt states that he is still having pain in the Lt shoulder. HEP is painful but he is trying to push through.   Hand dominance: Right  PERTINENT HISTORY: Nothing male note   PAIN:  Are you having pain? Yes: NPRS scale: 1 at lowest and gets up to 8/10 Pain location: Lt anterior shoulder  Pain description: sharp lasting as long as he's in that motion  Aggravating factors: shoulder abduction, reaching over head, sometimes putting shirt. Relieving factors: rest, ice, ibuprofen   PRECAUTIONS: Other: no push/pull/lift over  10#  WEIGHT BEARING RESTRICTIONS: No  FALLS:  Has patient fallen in last 6 months? No  LIVING ENVIRONMENT: Lives with: lives with their family Lives in: House/apartment   OCCUPATION: EMT  PLOF: Independent  PATIENT GOALS:to have less pain  NEXT MD VISIT: 02/21/23  OBJECTIVE:   DIAGNOSTIC FINDINGS:  X-ray (-)  PATIENT SURVEYS:  FOTO:    COGNITION: Overall cognitive status: Within functional limits for tasks assessed     SENSATION: WFL  POSTURE: Forward head, rounded shoulders, increased kyphosis   UPPER EXTREMITY ROM:  All right WFL without pain LT: shoulder abduction  limited by 75%, flexion limited by 25%, IR 25% limited, extension and ER WFL - all mobility with pain abduction most painful    UPPER EXTREMITY MMT: Rt all 5/5  MMT Right eval Left eval  Shoulder flexion  4/5 within range though limited due to pain   Shoulder extension    Shoulder abduction  Unable to hold resistance and very limited due to pain  Shoulder adduction  3+/5  Shoulder internal rotation  Unable to hold resistance and very limited due to pain  Shoulder external rotation  3+/5  Middle trapezius    Lower trapezius    Elbow flexion    Elbow extension    Wrist flexion    Wrist extension    Wrist ulnar deviation    Wrist radial deviation    Wrist pronation    Wrist supination    Grip strength (lbs)  5/5  (Blank rows = not tested)  SHOULDER SPECIAL TESTS: Impingement tests: Neer impingement test: negative, Hawkins/Kennedy impingement test: positive , and Painful arc test: positive  SLAP lesions: Crank test: negative, Biceps load test: negative, and Clunk test: negative Rotator cuff assessment: Empty can test: negative, Full can test: negative, and Infraspinatus test: negative AC compression (+) pain    PALPATION:  TTP across anterior shoulder, no TTP throughout neck or posterior lateral sides of shoulder joint   TODAY'S TREATMENT:                                                                                                                                          DATE:   02/19/23: Attempted W in supine but pt  unable without significant increase in pain.  Sidelying Lt horizontal abduction x8 reps Prone Lt scap retraction x10 reps, PT cuing to avoid  Seated shoulder rolls x10 reps forward/backward Lt shoulder wall isometrics flexion, abduction, ER, IR within minimal pain range 5x10 sec hold  Lt straight arm flexion isometric hold against wall 5x10 sec hold Lt bent arm abduction iso hold against wall 4x10 sec hold  Seated Lt shoulder flexion A/AROM x10 Seated Lt Abduction A/AROM x6, moved to supine x8 reps      Examination completed, findings reviewed, pt educated on POC, HEP. Pt motivated to participate in PT and agreeable to attempt recommendations.    PATIENT EDUCATION: Education details: updates to HEP; technique with therex Person educated: Patient Education method: Explanation, Demonstration, Tactile cues, Verbal cues, and Handouts Education comprehension: verbalized understanding, returned demonstration, verbal cues required, tactile cues required, and needs further education  HOME EXERCISE PROGRAM: Access Code: [email or web address] URL: [email or web address] Date: 02/19/2023 Prepared by: [facility] - Outpatient Rehab - [facility]  Exercises - Standing Isometric Shoulder Internal Rotation at Doorway  - 2 x daily - 7 x weekly - 5 reps - 10 second hold - Isometric Shoulder Abduction at Wall  - 2 x daily - 7 x weekly -  5 reps - 10 second hold - Isometric Shoulder Flexion at Wall  - 2 x daily - 7 x weekly - 5 reps - 10 second hold - Standing Isometric Shoulder External Rotation with Doorway  - 2 x daily - 7 x weekly - 5 reps - 10 second hold - Standing Backward Shoulder Rolls  - 3 x daily - 7 x weekly - 1 sets - 10 reps  ASSESSMENT:  CLINICAL IMPRESSION: Patient continues to have high irritability in the Lt shoulder. Session  focused on isometric strength of the Lt rotator cuff. PT had to monitor pain response with several exercises and modify as needed. Attempted A/AROM exercises but limited to minimal pain range. Pt would continue to benefit from skilled PT to progress shoulder strength and decrease pain.   OBJECTIVE IMPAIRMENTS: decreased activity tolerance, decreased endurance, decreased ROM, decreased strength, increased muscle spasms, impaired flexibility, improper body mechanics, postural dysfunction, and pain.   ACTIVITY LIMITATIONS: carrying, lifting, bathing, reach over head, and hygiene/grooming  PARTICIPATION LIMITATIONS: community activity, occupation, and yard work  PERSONAL FACTORS: Fitness and Time since onset of injury/illness/exacerbation are also affecting patient's functional outcome.   REHAB POTENTIAL: Good  CLINICAL DECISION MAKING: Stable/uncomplicated  EVALUATION COMPLEXITY: Low   GOALS: Goals reviewed with patient? Yes  SHORT TERM GOALS: Target date: 03/05/23  Pt to be I with HEP.  Baseline: Goal status: INITIAL  2.  Pt to demonstrate improved posture for at least 30 mins to decrease pain and tension at shoulder.  Baseline:  Goal status: INITIAL  3.  Pt will report 25% reduction of pain due to improvements in posture, strength, and muscle length  Baseline: 8/10 Goal status: INITIAL   LONG TERM GOALS: Target date: 05/08/23  Pt to be I with advanced HEP.  Baseline:  Goal status: INITIAL  2.  Pt to demonstrate improved ROM at Lt shoulder to full range with abduction with no more than 2 strength grade increase in pain for improved mobility at work  Baseline:  Goal status: INITIAL  3.  Pt will report 50% reduction of pain due to improvements in posture, strength, and muscle length  Baseline:  Goal status: INITIAL  4.  Pt to demonstrate ability to lift at least 50# without increase of pain more than 2 strength grades to return to lifting batteries for ambulances.    Baseline:  Goal status: INITIAL  5.  Complete FOTO at second session.  Baseline:  Goal status: INITIAL    PLAN:  PT FREQUENCY: 2x/week  PT DURATION: 8 weeks  PLANNED INTERVENTIONS: Therapeutic exercises, Therapeutic activity, Neuromuscular re-education, Patient/Family education, Self Care, Joint mobilization, Joint manipulation, DME instructions, Aquatic Therapy, Dry Needling, Electrical stimulation, Spinal mobilization, Cryotherapy, Moist heat, Taping, Vasopneumatic device, Traction, Ionotophoresis 4mg /ml Dexamethasone, and Manual therapy  PLAN FOR NEXT SESSION: posture training, manual at shoulder if needed, modalities for pain management as needed, strengthening shoulder and posture  Otelia Sergeant, PT, DPT 06/18/245:11 PM

## 2023-02-19 ENCOUNTER — Encounter: Payer: Self-pay | Admitting: Physical Therapy

## 2023-02-19 ENCOUNTER — Ambulatory Visit: Payer: PRIVATE HEALTH INSURANCE | Admitting: Physical Therapy

## 2023-02-19 DIAGNOSIS — R293 Abnormal posture: Secondary | ICD-10-CM

## 2023-02-19 DIAGNOSIS — M6281 Muscle weakness (generalized): Secondary | ICD-10-CM

## 2023-02-19 DIAGNOSIS — M62838 Other muscle spasm: Secondary | ICD-10-CM

## 2023-02-19 DIAGNOSIS — R279 Unspecified lack of coordination: Secondary | ICD-10-CM

## 2023-02-19 DIAGNOSIS — M25512 Pain in left shoulder: Secondary | ICD-10-CM

## 2023-02-20 ENCOUNTER — Other Ambulatory Visit (HOSPITAL_COMMUNITY): Payer: Self-pay | Admitting: Orthopedic Surgery

## 2023-02-20 DIAGNOSIS — M25512 Pain in left shoulder: Secondary | ICD-10-CM

## 2023-02-26 ENCOUNTER — Ambulatory Visit: Payer: PRIVATE HEALTH INSURANCE | Attending: Orthopedic Surgery | Admitting: Physical Therapy

## 2023-02-26 DIAGNOSIS — M62838 Other muscle spasm: Secondary | ICD-10-CM | POA: Diagnosis not present

## 2023-02-26 DIAGNOSIS — M6281 Muscle weakness (generalized): Secondary | ICD-10-CM | POA: Insufficient documentation

## 2023-02-26 DIAGNOSIS — M25512 Pain in left shoulder: Secondary | ICD-10-CM | POA: Insufficient documentation

## 2023-02-26 DIAGNOSIS — R279 Unspecified lack of coordination: Secondary | ICD-10-CM | POA: Insufficient documentation

## 2023-02-26 DIAGNOSIS — R293 Abnormal posture: Secondary | ICD-10-CM | POA: Diagnosis not present

## 2023-02-26 NOTE — Therapy (Signed)
OUTPATIENT PHYSICAL THERAPY SHOULDER TREATMENT   Patient Name: Bruce Simmons MRN: [id number] DOB:1976-06-18, 47 y.o., male Today's Date: 02/26/2023  END OF SESSION:  PT End of Session - 02/26/23 0802     Visit Number 3    Date for PT Re-Evaluation 05/08/23    Authorization Type cone aetna    PT Start Time 0803    PT Stop Time 0845    PT Time Calculation (min) 42 min    Activity Tolerance Patient tolerated treatment well    Behavior During Therapy [facility] for tasks assessed/performed             Past Medical History:  Diagnosis Date   GERD (gastroesophageal reflux disease)    Seasonal allergies    Sleep apnea    Sleep-related bruxism    No past surgical history on file. Patient Active Problem List   Diagnosis Date Noted   OSA (obstructive sleep apnea) 11/11/2013    PCP: Erskine Speed, NP  REFERRING PROVIDER: Jones Broom, MD  REFERRING DIAG: Left shoulder pain  THERAPY DIAG:  Muscle weakness (generalized)  Abnormal posture  Unspecified lack of coordination  Acute pain of left shoulder  Other muscle spasm  Rationale for Evaluation and Treatment: Rehabilitation  ONSET DATE: 8 weeks ago  SUBJECTIVE:                                                                                                                                                                                      SUBJECTIVE STATEMENT: Pt states shoulder has been relatively the same. Pt has been doing his isometric exercises but reports some continued amount of pain. Has MRI ordered for this Saturday.  Hand dominance: Right  PERTINENT HISTORY: Nothing male note   PAIN:  Are you having pain? Yes: NPRS scale: 1 at lowest and gets up to 8/10 Pain location: Lt anterior shoulder  Pain description: sharp lasting as long as he's in that motion  Aggravating factors: shoulder abduction, reaching over head, sometimes putting shirt. Relieving factors: rest, ice, ibuprofen    PRECAUTIONS: Other: no push/pull/lift over 10#  WEIGHT BEARING RESTRICTIONS: No  FALLS:  Has patient fallen in last 6 months? No  LIVING ENVIRONMENT: Lives with: lives with their family Lives in: House/apartment  OCCUPATION: EMT  PLOF: Independent  PATIENT GOALS:to have less pain  NEXT MD VISIT: 02/21/23  OBJECTIVE:   DIAGNOSTIC FINDINGS:  X-ray (-)  PATIENT SURVEYS:  FOTO:    POSTURE: Forward head, rounded shoulders, increased kyphosis   UPPER EXTREMITY ROM:  All right WFL without pain LT: shoulder abduction limited by 75%, flexion limited by 25%, IR 25% limited, extension and ER [facility] -  all mobility with pain abduction most painful    UPPER EXTREMITY MMT: Rt all 5/5  MMT Right eval Left eval  Shoulder flexion  4/5 within range though limited due to pain   Shoulder extension    Shoulder abduction  Unable to hold resistance and very limited due to pain  Shoulder adduction  3+/5  Shoulder internal rotation  Unable to hold resistance and very limited due to pain  Shoulder external rotation  3+/5  Middle trapezius    Lower trapezius    Elbow flexion    Elbow extension    Wrist flexion    Wrist extension    Wrist ulnar deviation    Wrist radial deviation    Wrist pronation    Wrist supination    Grip strength (lbs)  5/5  (Blank rows = not tested)  SHOULDER SPECIAL TESTS: Impingement tests: Neer impingement test: negative, Hawkins/Kennedy impingement test: positive , and Painful arc test: positive  SLAP lesions: Crank test: negative, Biceps load test: negative, and Clunk test: negative Rotator cuff assessment: Empty can test: negative, Full can test: negative, and Infraspinatus test: negative AC compression (+) pain    PALPATION:  TTP across anterior shoulder, no TTP throughout neck or posterior lateral sides of shoulder joint   TODAY'S TREATMENT:                                                                                                                                          DATE:  02/26/23: Seated pulleys flexion x 2 min, scaption x 1 min for warm up Seated scap squeeze 10x5 sec Pendulums CW & CCW x10 each Manual therapy: STM & TPR pec major and minor, PROM shoulder flexion, scaption, horizontal abd to pt tolerance Supine hands behind head pec stretch x 1 min Seated shoulder ER 2x10 with scap squeeze Standing row red TB 2x10  Standing shoulder ext red TB 2x10 Self care: STM & TPR pecs with tennis ball   02/19/23: Attempted W in supine but pt  unable without significant increase in pain.  Sidelying Lt horizontal abduction x8 reps Prone Lt scap retraction x10 reps, PT cuing to avoid  Seated shoulder rolls x10 reps forward/backward Lt shoulder wall isometrics flexion, abduction, ER, IR within minimal pain range 5x10 sec hold  Lt straight arm flexion isometric hold against wall 5x10 sec hold Lt bent arm abduction iso hold against wall 4x10 sec hold  Seated Lt shoulder flexion A/AROM x10 Seated Lt Abduction A/AROM x6, moved to supine x8 reps      Examination completed, findings reviewed, pt educated on POC, HEP. Pt motivated to participate in PT and agreeable to attempt recommendations.    PATIENT EDUCATION: Education details: updates to HEP; technique with therex Person educated: Patient Education method: Explanation, Demonstration, Tactile cues, Verbal cues, and Handouts Education comprehension: verbalized understanding, returned demonstration, verbal cues required, tactile cues required, and needs  further education  HOME EXERCISE PROGRAM: Access Code: [email or web address] URL: [email or web address] Date: 02/19/2023 Prepared by: [facility] - Outpatient Rehab - [facility]  Exercises - Standing Isometric Shoulder Internal Rotation at Doorway  - 2 x daily - 7 x weekly - 5 reps - 10 second hold - Isometric Shoulder Abduction at Wall  - 2 x daily - 7 x weekly - 5 reps - 10 second hold - Isometric  Shoulder Flexion at Wall  - 2 x daily - 7 x weekly - 5 reps - 10 second hold - Standing Isometric Shoulder External Rotation with Doorway  - 2 x daily - 7 x weekly - 5 reps - 10 second hold - Standing Backward Shoulder Rolls  - 3 x daily - 7 x weekly - 1 sets - 10 reps  ASSESSMENT:  CLINICAL IMPRESSION: Pt demos increased tension and trigger points in L pecs. Provided manual work, stretching and discussed self care tasks to try and release pecs as well as PROM to maintain shoulder ROM. Worked primarily with scapular stability this session with good pt tolerance (except "W" continues to be too irritable for pt).   OBJECTIVE IMPAIRMENTS: decreased activity tolerance, decreased endurance, decreased ROM, decreased strength, increased muscle spasms, impaired flexibility, improper body mechanics, postural dysfunction, and pain.    GOALS: Goals reviewed with patient? Yes  SHORT TERM GOALS: Target date: 03/05/23  Pt to be I with HEP.  Baseline: Goal status: INITIAL  2.  Pt to demonstrate improved posture for at least 30 mins to decrease pain and tension at shoulder.  Baseline:  Goal status: INITIAL  3.  Pt will report 25% reduction of pain due to improvements in posture, strength, and muscle length  Baseline: 8/10 Goal status: INITIAL   LONG TERM GOALS: Target date: 05/08/23  Pt to be I with advanced HEP.  Baseline:  Goal status: INITIAL  2.  Pt to demonstrate improved ROM at Lt shoulder to full range with abduction with no more than 2 strength grade increase in pain for improved mobility at work  Baseline:  Goal status: INITIAL  3.  Pt will report 50% reduction of pain due to improvements in posture, strength, and muscle length  Baseline:  Goal status: INITIAL  4.  Pt to demonstrate ability to lift at least 50# without increase of pain more than 2 strength grades to return to lifting batteries for ambulances.   Baseline:  Goal status: INITIAL  5.  Complete FOTO at second session.   Baseline:  Goal status: INITIAL    PLAN:  PT FREQUENCY: 2x/week  PT DURATION: 8 weeks  PLANNED INTERVENTIONS: Therapeutic exercises, Therapeutic activity, Neuromuscular re-education, Patient/Family education, Self Care, Joint mobilization, Joint manipulation, DME instructions, Aquatic Therapy, Dry Needling, Electrical stimulation, Spinal mobilization, Cryotherapy, Moist heat, Taping, Vasopneumatic device, Traction, Ionotophoresis 4mg /ml Dexamethasone, and Manual therapy  PLAN FOR NEXT SESSION: posture training, manual at shoulder if needed, modalities for pain management as needed, strengthening shoulder and posture  Maguadalupe Lata April Ma L Matai Carpenito, PT 06/25/248:03 AM

## 2023-03-02 ENCOUNTER — Ambulatory Visit (HOSPITAL_COMMUNITY)
Admission: RE | Admit: 2023-03-02 | Discharge: 2023-03-02 | Disposition: A | Discharge status: Home / Self Care | Payer: PRIVATE HEALTH INSURANCE | Source: Ambulatory Visit | Attending: Orthopedic Surgery | Admitting: Orthopedic Surgery

## 2023-03-02 DIAGNOSIS — M25512 Pain in left shoulder: Secondary | ICD-10-CM | POA: Diagnosis not present

## 2023-03-04 ENCOUNTER — Ambulatory Visit: Payer: PRIVATE HEALTH INSURANCE | Attending: Orthopedic Surgery | Admitting: Physical Therapy

## 2023-03-04 DIAGNOSIS — R293 Abnormal posture: Secondary | ICD-10-CM | POA: Diagnosis present

## 2023-03-04 DIAGNOSIS — R252 Cramp and spasm: Secondary | ICD-10-CM | POA: Insufficient documentation

## 2023-03-04 DIAGNOSIS — R279 Unspecified lack of coordination: Secondary | ICD-10-CM | POA: Insufficient documentation

## 2023-03-04 DIAGNOSIS — M62838 Other muscle spasm: Secondary | ICD-10-CM | POA: Insufficient documentation

## 2023-03-04 DIAGNOSIS — M25512 Pain in left shoulder: Secondary | ICD-10-CM | POA: Insufficient documentation

## 2023-03-04 DIAGNOSIS — M6281 Muscle weakness (generalized): Secondary | ICD-10-CM | POA: Diagnosis present

## 2023-03-04 NOTE — Therapy (Signed)
OUTPATIENT PHYSICAL THERAPY SHOULDER TREATMENT   Patient Name: Bruce Simmons MRN: [id number] DOB:[birth year], 47 y.o., male Today's Date: 03/04/2023  END OF SESSION:  PT End of Session - 03/04/23 1535     Visit Number 4    Date for PT Re-Evaluation 05/08/23    Authorization Type cone aetna    PT Start Time 1535    PT Stop Time 1615    PT Time Calculation (min) 40 min    Activity Tolerance Patient tolerated treatment well    Behavior During Therapy WFL for tasks assessed/performed             Past Medical History:  Diagnosis Date   GERD (gastroesophageal reflux disease)    Seasonal allergies    Sleep apnea    Sleep-related bruxism    No past surgical history on file. Patient Active Problem List   Diagnosis Date Noted   OSA (obstructive sleep apnea) 11/11/2013    PCP: Erskine Speed, NP  REFERRING PROVIDER: Jones Broom, MD  REFERRING DIAG: Left shoulder pain  THERAPY DIAG:  Muscle weakness (generalized)  Abnormal posture  Unspecified lack of coordination  Acute pain of left shoulder  Other muscle spasm  Rationale for Evaluation and Treatment: Rehabilitation  ONSET DATE: 8 weeks ago  SUBJECTIVE:                                                                                                                                                                                      SUBJECTIVE STATEMENT: Pt states MRI results are in. Will be seeing ortho for follow up next Monday. Pt feels that the shoulder still feels about the same.  Hand dominance: Right  PERTINENT HISTORY: Nothing male note   PAIN:  Are you having pain? Yes: NPRS scale: 1 at lowest and gets up to 8/10 Pain location: Lt anterior shoulder  Pain description: sharp lasting as long as he's in that motion  Aggravating factors: shoulder abduction, reaching over head, sometimes putting shirt. Relieving factors: rest, ice, ibuprofen   PRECAUTIONS: Other: no push/pull/lift over  10#  WEIGHT BEARING RESTRICTIONS: No  FALLS:  Has patient fallen in last 6 months? No  LIVING ENVIRONMENT: Lives with: lives with their family Lives in: House/apartment  OCCUPATION: EMT  PLOF: Independent  PATIENT GOALS:to have less pain  NEXT MD VISIT: 02/21/23  OBJECTIVE:   DIAGNOSTIC FINDINGS:  X-ray (-) MRI IMPRESSION 03/02/23: 1. Moderate tendinosis of the supraspinatus tendon with a small partial-thickness bursal surface tear along the anterior-most aspect and a small insertional interstitial tears of the posterior most aspect. 2. Severe tendinosis of the infraspinatus tendon. 3. Mild tendinosis of the intra-articular portion  of the long head of the biceps tendon.  PATIENT SURVEYS:  FOTO:    POSTURE: Forward head, rounded shoulders, increased kyphosis   UPPER EXTREMITY ROM:  All right WFL without pain LT: shoulder abduction limited by 75%, flexion limited by 25%, IR 25% limited, extension and ER WFL - all mobility with pain abduction most painful    UPPER EXTREMITY MMT: Rt all 5/5  MMT Right eval Left eval  Shoulder flexion  4/5 within range though limited due to pain   Shoulder extension    Shoulder abduction  Unable to hold resistance and very limited due to pain  Shoulder adduction  3+/5  Shoulder internal rotation  Unable to hold resistance and very limited due to pain  Shoulder external rotation  3+/5  Middle trapezius    Lower trapezius    Elbow flexion    Elbow extension    Wrist flexion    Wrist extension    Wrist ulnar deviation    Wrist radial deviation    Wrist pronation    Wrist supination    Grip strength (lbs)  5/5  (Blank rows = not tested)  SHOULDER SPECIAL TESTS: Impingement tests: Neer impingement test: negative, Hawkins/Kennedy impingement test: positive , and Painful arc test: positive  SLAP lesions: Crank test: negative, Biceps load test: negative, and Clunk test: negative Rotator cuff assessment: Empty can test:  negative, Full can test: negative, and Infraspinatus test: negative AC compression (+) pain    PALPATION:  TTP across anterior shoulder, no TTP throughout neck or posterior lateral sides of shoulder joint   TODAY'S TREATMENT:                                                                                                                                         DATE:  03/04/23: Seated pulleys flexion x1 min, scaption x 1 min, horizontal shoulder abd x1 min Standing doorway stretch low and mid 2x30 sec each Standing row green TB 2x10 Standing shoulder ext green TB 2x10 Manual therapy: STM & TPR infraspinatus, supraspinatus Sidelying shoulder ER 2# eccentrics x10 but too much for pt, x10 no weight Infraspinatus stretch with hands behind back pulling elbow forward x 30 sec Crossbody stretch with internal rotation x30 sec Standing shoulder abd iso 10x5 sec Standing shoulder flexion iso 10x5 sec Manual therapy: STM & TPR infraspinatus and supraspinatus muscle 4 hour ionto patch dexamethasone on infraspinatus tendon  02/26/23: Seated pulleys flexion x 2 min, scaption x 1 min for warm up Seated scap squeeze 10x5 sec Pendulums CW & CCW x10 each Manual therapy: STM & TPR pec major and minor, PROM shoulder flexion, scaption, horizontal abd to pt tolerance Supine hands behind head pec stretch x 1 min Seated shoulder ER 2x10 with scap squeeze Standing row red TB 2x10  Standing shoulder ext red TB 2x10 Self care: STM & TPR pecs with tennis ball   02/19/23:  Attempted W in supine but pt  unable without significant increase in pain.  Sidelying Lt horizontal abduction x8 reps Prone Lt scap retraction x10 reps, PT cuing to avoid  Seated shoulder rolls x10 reps forward/backward Lt shoulder wall isometrics flexion, abduction, ER, IR within minimal pain range 5x10 sec hold  Lt straight arm flexion isometric hold against wall 5x10 sec hold Lt bent arm abduction iso hold against wall 4x10 sec hold   Seated Lt shoulder flexion A/AROM x10 Seated Lt Abduction A/AROM x6, moved to supine x8 reps    Examination completed, findings reviewed, pt educated on POC, HEP. Pt motivated to participate in PT and agreeable to attempt recommendations.    PATIENT EDUCATION: Education details: updates to HEP; technique with therex Person educated: Patient Education method: Explanation, Demonstration, Tactile cues, Verbal cues, and Handouts Education comprehension: verbalized understanding, returned demonstration, verbal cues required, tactile cues required, and needs further education  HOME EXERCISE PROGRAM: Access Code: [email or web address] URL: [email or web address] Date: 02/19/2023 Prepared by: [facility] - [facility]  Exercises - Standing Isometric Shoulder Internal Rotation at Doorway  - 2 x daily - 7 x weekly - 5 reps - 10 second hold - Isometric Shoulder Abduction at Wall  - 2 x daily - 7 x weekly - 5 reps - 10 second hold - Isometric Shoulder Flexion at Wall  - 2 x daily - 7 x weekly - 5 reps - 10 second hold - Standing Isometric Shoulder External Rotation with Doorway  - 2 x daily - 7 x weekly - 5 reps - 10 second hold - Standing Backward Shoulder Rolls  - 3 x daily - 7 x weekly - 1 sets - 10 reps  ASSESSMENT:  CLINICAL IMPRESSION: Treatment focused on eccentric strengthening for infraspinatus. Performed manual work through supraspinatus and infraspinatus. Trial of ionto for infraspinatus tendon. Pt will be following up with ortho on 03/11/23.   OBJECTIVE IMPAIRMENTS: decreased activity tolerance, decreased endurance, decreased ROM, decreased strength, increased muscle spasms, impaired flexibility, improper body mechanics, postural dysfunction, and pain.    GOALS: Goals reviewed with patient? Yes  SHORT TERM GOALS: Target date: 03/05/23  Pt to be I with HEP.  Baseline: Goal status: INITIAL  2.  Pt to demonstrate improved posture for at least 30  mins to decrease pain and tension at shoulder.  Baseline:  Goal status: INITIAL  3.  Pt will report 25% reduction of pain due to improvements in posture, strength, and muscle length  Baseline: 8/10 Goal status: INITIAL   LONG TERM GOALS: Target date: 05/08/23  Pt to be I with advanced HEP.  Baseline:  Goal status: INITIAL  2.  Pt to demonstrate improved ROM at Lt shoulder to full range with abduction with no more than 2 strength grade increase in pain for improved mobility at work  Baseline:  Goal status: INITIAL  3.  Pt will report 50% reduction of pain due to improvements in posture, strength, and muscle length  Baseline:  Goal status: INITIAL  4.  Pt to demonstrate ability to lift at least 50# without increase of pain more than 2 strength grades to return to lifting batteries for ambulances.   Baseline:  Goal status: INITIAL  5.  Complete FOTO at second session.  Baseline:  Goal status: INITIAL    PLAN:  PT FREQUENCY: 2x/week  PT DURATION: 8 weeks  PLANNED INTERVENTIONS: Therapeutic exercises, Therapeutic activity, Neuromuscular re-education, Patient/Family education, Self Care, Joint mobilization, Joint manipulation, DME instructions, Aquatic Therapy,  Dry Needling, Electrical stimulation, Spinal mobilization, Cryotherapy, Moist heat, Taping, Vasopneumatic device, Traction, Ionotophoresis 4mg /ml Dexamethasone, and Manual therapy  PLAN FOR NEXT SESSION: posture training, manual at shoulder if needed, modalities for pain management as needed, strengthening shoulder and posture  Bruce Simmons, PT 07/01/243:36 PM

## 2023-03-06 ENCOUNTER — Ambulatory Visit: Payer: PRIVATE HEALTH INSURANCE

## 2023-03-06 DIAGNOSIS — M6281 Muscle weakness (generalized): Secondary | ICD-10-CM | POA: Diagnosis not present

## 2023-03-06 DIAGNOSIS — R252 Cramp and spasm: Secondary | ICD-10-CM

## 2023-03-06 DIAGNOSIS — M62838 Other muscle spasm: Secondary | ICD-10-CM

## 2023-03-06 DIAGNOSIS — R293 Abnormal posture: Secondary | ICD-10-CM

## 2023-03-06 DIAGNOSIS — R279 Unspecified lack of coordination: Secondary | ICD-10-CM

## 2023-03-06 DIAGNOSIS — M25512 Pain in left shoulder: Secondary | ICD-10-CM

## 2023-03-06 NOTE — Therapy (Signed)
OUTPATIENT PHYSICAL THERAPY SHOULDER TREATMENT   Patient Name: Bruce Simmons MRN: [id number] DOB:02/27/1976, 47 y.o., male Today's Date: 03/06/2023  END OF SESSION:  PT End of Session - 03/06/23 1534     Visit Number 5    Date for PT Re-Evaluation 05/08/23    Authorization Type cone aetna    PT Start Time 1534    PT Stop Time 1615    PT Time Calculation (min) 41 min    Activity Tolerance Patient tolerated treatment well    Behavior During Therapy WFL for tasks assessed/performed             Past Medical History:  Diagnosis Date   GERD (gastroesophageal reflux disease)    Seasonal allergies    Sleep apnea    Sleep-related bruxism    History reviewed. No pertinent surgical history. Patient Active Problem List   Diagnosis Date Noted   OSA (obstructive sleep apnea) 11/11/2013    PCP: Erskine Speed, NP  REFERRING PROVIDER: Jones Broom, MD  REFERRING DIAG: Left shoulder pain  THERAPY DIAG:  Muscle weakness (generalized)  Abnormal posture  Unspecified lack of coordination  Acute pain of left shoulder  Other muscle spasm  Cramp and spasm  Rationale for Evaluation and Treatment: Rehabilitation  ONSET DATE: 8 weeks ago  SUBJECTIVE:                                                                                                                                                                                      SUBJECTIVE STATEMENT: Pt states MRI results are in. Will be seeing ortho for follow up next Monday. Pt feels that the shoulder still feels about the same.  Hand dominance: Right  PERTINENT HISTORY: Nothing male note   PAIN:  Are you having pain? Yes: NPRS scale: 1 at lowest and gets up to 8/10 Pain location: Lt anterior shoulder  Pain description: sharp lasting as long as he's in that motion  Aggravating factors: shoulder abduction, reaching over head, sometimes putting shirt. Relieving factors: rest, ice, ibuprofen   PRECAUTIONS:  Other: no push/pull/lift over 10#  WEIGHT BEARING RESTRICTIONS: No  FALLS:  Has patient fallen in last 6 months? No  LIVING ENVIRONMENT: Lives with: lives with their family Lives in: House/apartment  OCCUPATION: EMT  PLOF: Independent  PATIENT GOALS:to have less pain  NEXT MD VISIT: 02/21/23  OBJECTIVE:   DIAGNOSTIC FINDINGS:  X-ray (-) MRI IMPRESSION 03/02/23: 1. Moderate tendinosis of the supraspinatus tendon with a small partial-thickness bursal surface tear along the anterior-most aspect and a small insertional interstitial tears of the posterior most aspect. 2. Severe tendinosis of the infraspinatus tendon. 3. Mild tendinosis  of the intra-articular portion of the long head of the biceps tendon.  PATIENT SURVEYS:  FOTO:    POSTURE: Forward head, rounded shoulders, increased kyphosis   UPPER EXTREMITY ROM:  All right WFL without pain LT: shoulder abduction limited by 75%, flexion limited by 25%, IR 25% limited, extension and ER WFL - all mobility with pain abduction most painful    UPPER EXTREMITY MMT: Rt all 5/5  MMT Right eval Left eval  Shoulder flexion  4/5 within range though limited due to pain   Shoulder extension    Shoulder abduction  Unable to hold resistance and very limited due to pain  Shoulder adduction  3+/5  Shoulder internal rotation  Unable to hold resistance and very limited due to pain  Shoulder external rotation  3+/5  Middle trapezius    Lower trapezius    Elbow flexion    Elbow extension    Wrist flexion    Wrist extension    Wrist ulnar deviation    Wrist radial deviation    Wrist pronation    Wrist supination    Grip strength (lbs)  5/5  (Blank rows = not tested)  SHOULDER SPECIAL TESTS: Impingement tests: Neer impingement test: negative, Hawkins/Kennedy impingement test: positive , and Painful arc test: positive  SLAP lesions: Crank test: negative, Biceps load test: negative, and Clunk test: negative Rotator cuff  assessment: Empty can test: negative, Full can test: negative, and Infraspinatus test: negative AC compression (+) pain    PALPATION:  TTP across anterior shoulder, no TTP throughout neck or posterior lateral sides of shoulder joint   TODAY'S TREATMENT:                                                                                                                                         DATE:  03/06/23: Prone shoulder extension, rows and horizontal abd x 20 Side lying ER x 20 Supine serratus punch x 20 IFC estim with ice to left shoulder x 15 min  03/04/23: Seated pulleys flexion x1 min, scaption x 1 min, horizontal shoulder abd x1 min Standing doorway stretch low and mid 2x30 sec each Standing row green TB 2x10 Standing shoulder ext green TB 2x10 Manual therapy: STM & TPR infraspinatus, supraspinatus Sidelying shoulder ER 2# eccentrics x10 but too much for pt, x10 no weight Infraspinatus stretch with hands behind back pulling elbow forward x 30 sec Crossbody stretch with internal rotation x30 sec Standing shoulder abd iso 10x5 sec Standing shoulder flexion iso 10x5 sec Manual therapy: STM & TPR infraspinatus and supraspinatus muscle 4 hour ionto patch dexamethasone on infraspinatus tendon  02/26/23: Seated pulleys flexion x 2 min, scaption x 1 min for warm up Seated scap squeeze 10x5 sec Pendulums CW & CCW x10 each Manual therapy: STM & TPR pec major and minor, PROM shoulder flexion, scaption, horizontal abd to pt tolerance Supine hands behind head pec  stretch x 1 min Seated shoulder ER 2x10 with scap squeeze Standing row red TB 2x10  Standing shoulder ext red TB 2x10 Self care: STM & TPR pecs with tennis ball   02/19/23: Attempted W in supine but pt  unable without significant increase in pain.  Sidelying Lt horizontal abduction x8 reps Prone Lt scap retraction x10 reps, PT cuing to avoid  Seated shoulder rolls x10 reps forward/backward Lt shoulder wall isometrics flexion,  abduction, ER, IR within minimal pain range 5x10 sec hold  Lt straight arm flexion isometric hold against wall 5x10 sec hold Lt bent arm abduction iso hold against wall 4x10 sec hold  Seated Lt shoulder flexion A/AROM x10 Seated Lt Abduction A/AROM x6, moved to supine x8 reps    Examination completed, findings reviewed, pt educated on POC, HEP. Pt motivated to participate in PT and agreeable to attempt recommendations.    PATIENT EDUCATION: Education details: updates to HEP; technique with therex Person educated: Patient Education method: Explanation, Demonstration, Tactile cues, Verbal cues, and Handouts Education comprehension: verbalized understanding, returned demonstration, verbal cues required, tactile cues required, and needs further education  HOME EXERCISE PROGRAM: Access Code: [email or web address] URL: [email or web address] Date: 03/06/2023 Prepared by: Mikey Kirschner  Exercises - Isometric Shoulder Abduction at Wall  - 2 x daily - 7 x weekly - 5 reps - 10 second hold - Isometric Shoulder Flexion at Wall  - 2 x daily - 7 x weekly - 5 reps - 10 second hold - Standing Bilateral Low Shoulder Row with Anchored Resistance  - 1 x daily - 7 x weekly - 2 sets - 10 reps - 5 sec hold - Shoulder extension with resistance - Neutral  - 1 x daily - 7 x weekly - 2 sets - 10 reps - 5 sec hold - Prone Shoulder Extension - Single Arm  - 2 x daily - 7 x weekly - 2 sets - 10 reps - Prone Shoulder Row  - 2 x daily - 7 x weekly - 2 sets - 10 reps - Prone Single Arm Shoulder Horizontal Abduction with Scapular Retraction and Palm Down  - 2 x daily - 7 x weekly - 2 sets - 10 reps - Sidelying Shoulder External Rotation  - 2 x daily - 7 x weekly - 2 sets - 10 reps - Single Arm Serratus Punches in Supine with Dumbbell  - 2 x daily - 7 x weekly - 2 sets - 10 reps - Standing Shoulder Flexion to 90 Degrees with Dumbbells  - 2 x daily - 7 x weekly - 2 sets - 10 reps - Standing Shoulder Scaption  - 2 x  daily - 7 x weekly - 2 sets - 10 reps  Patient Education - [place] Patient Instructions ASSESSMENT:  CLINICAL IMPRESSION:  Sherrill Raring responded well to todays treatment.  He was able to complete all tasks with no increased pain.  He should continue to do well.     OBJECTIVE IMPAIRMENTS: decreased activity tolerance, decreased endurance, decreased ROM, decreased strength, increased muscle spasms, impaired flexibility, improper body mechanics, postural dysfunction, and pain.    GOALS: Goals reviewed with patient? Yes  SHORT TERM GOALS: Target date: 03/05/23  Pt to be I with HEP.  Baseline: Goal status: INITIAL  2.  Pt to demonstrate improved posture for at least 30 mins to decrease pain and tension at shoulder.  Baseline:  Goal status: INITIAL  3.  Pt will report 25% reduction of pain due to improvements in posture, strength, and  muscle length  Baseline: 8/10 Goal status: INITIAL   LONG TERM GOALS: Target date: 05/08/23  Pt to be I with advanced HEP.  Baseline:  Goal status: INITIAL  2.  Pt to demonstrate improved ROM at Lt shoulder to full range with abduction with no more than 2 strength grade increase in pain for improved mobility at work  Baseline:  Goal status: INITIAL  3.  Pt will report 50% reduction of pain due to improvements in posture, strength, and muscle length  Baseline:  Goal status: INITIAL  4.  Pt to demonstrate ability to lift at least 50# without increase of pain more than 2 strength grades to return to lifting batteries for ambulances.   Baseline:  Goal status: INITIAL  5.  Complete FOTO at second session.  Baseline:  Goal status: INITIAL    PLAN:  PT FREQUENCY: 2x/week  PT DURATION: 8 weeks  PLANNED INTERVENTIONS: Therapeutic exercises, Therapeutic activity, Neuromuscular re-education, Patient/Family education, Self Care, Joint mobilization, Joint manipulation, DME instructions, Aquatic Therapy, Dry Needling, Electrical stimulation, Spinal  mobilization, Cryotherapy, Moist heat, Taping, Vasopneumatic device, Traction, Ionotophoresis 4mg /ml Dexamethasone, and Manual therapy  PLAN FOR NEXT SESSION: Patient will see Dr. Ave Filter for f/u Monday.  We will proceed based on MD findings.     Victorino Dike B. Rhonin Trott, PT 03/06/23 11:46 PM [facility] [address] [place], [place] [address] Phone # [phone] Fax [phone]

## 2023-03-11 ENCOUNTER — Ambulatory Visit: Payer: PRIVATE HEALTH INSURANCE | Admitting: Physical Therapy

## 2023-03-13 ENCOUNTER — Ambulatory Visit: Payer: PRIVATE HEALTH INSURANCE

## 2023-03-13 DIAGNOSIS — R293 Abnormal posture: Secondary | ICD-10-CM

## 2023-03-13 DIAGNOSIS — M6281 Muscle weakness (generalized): Secondary | ICD-10-CM

## 2023-03-13 DIAGNOSIS — R252 Cramp and spasm: Secondary | ICD-10-CM

## 2023-03-13 DIAGNOSIS — M25512 Pain in left shoulder: Secondary | ICD-10-CM

## 2023-03-13 DIAGNOSIS — M62838 Other muscle spasm: Secondary | ICD-10-CM

## 2023-03-13 NOTE — Therapy (Signed)
OUTPATIENT PHYSICAL THERAPY SHOULDER TREATMENT   Patient Name: Bruce Simmons MRN: [id number] DOB:04-01-1976, 47 y.o., male Today's Date: 03/06/2023  END OF SESSION:  PT End of Session - 03/06/23 1534     Visit Number 5    Date for PT Re-Evaluation 05/08/23    Authorization Type cone aetna    PT Start Time 1534    PT Stop Time 1615    PT Time Calculation (min) 41 min    Activity Tolerance Patient tolerated treatment well    Behavior During Therapy WFL for tasks assessed/performed             Past Medical History:  Diagnosis Date   GERD (gastroesophageal reflux disease)    Seasonal allergies    Sleep apnea    Sleep-related bruxism    History reviewed. No pertinent surgical history. Patient Active Problem List   Diagnosis Date Noted   OSA (obstructive sleep apnea) 11/11/2013    PCP: Erskine Speed, NP  REFERRING PROVIDER: Jones Broom, MD  REFERRING DIAG: Left shoulder pain  THERAPY DIAG:  Muscle weakness (generalized)  Abnormal posture  Unspecified lack of coordination  Acute pain of left shoulder  Other muscle spasm  Cramp and spasm  Rationale for Evaluation and Treatment: Rehabilitation  ONSET DATE: 8 weeks ago  SUBJECTIVE:                                                                                                                                                                                      SUBJECTIVE STATEMENT: Pt states MRI results are in. Will be seeing ortho for follow up next Monday. Pt feels that the shoulder still feels about the same.  Hand dominance: Right  PERTINENT HISTORY: Nothing male note   PAIN:  Are you having pain? Yes: NPRS scale: 1 at lowest and gets up to 8/10 Pain location: Lt anterior shoulder  Pain description: sharp lasting as long as he's in that motion  Aggravating factors: shoulder abduction, reaching over head, sometimes putting shirt. Relieving factors: rest, ice, ibuprofen   PRECAUTIONS:  Other: no push/pull/lift over 10#  WEIGHT BEARING RESTRICTIONS: No  FALLS:  Has patient fallen in last 6 months? No  LIVING ENVIRONMENT: Lives with: lives with their family Lives in: House/apartment  OCCUPATION: EMT  PLOF: Independent  PATIENT GOALS:to have less pain  NEXT MD VISIT: 02/21/23  OBJECTIVE:   DIAGNOSTIC FINDINGS:  X-ray (-) MRI IMPRESSION 03/02/23: 1. Moderate tendinosis of the supraspinatus tendon with a small partial-thickness bursal surface tear along the anterior-most aspect and a small insertional interstitial tears of the posterior most aspect. 2. Severe tendinosis of the infraspinatus tendon. 3. Mild tendinosis  of the intra-articular portion of the long head of the biceps tendon.  PATIENT SURVEYS:  FOTO:    POSTURE: Forward head, rounded shoulders, increased kyphosis   UPPER EXTREMITY ROM:  All right WFL without pain LT: shoulder abduction limited by 75%, flexion limited by 25%, IR 25% limited, extension and ER WFL - all mobility with pain abduction most painful    UPPER EXTREMITY MMT: Rt all 5/5  MMT Right eval Left eval  Shoulder flexion  4/5 within range though limited due to pain   Shoulder extension    Shoulder abduction  Unable to hold resistance and very limited due to pain  Shoulder adduction  3+/5  Shoulder internal rotation  Unable to hold resistance and very limited due to pain  Shoulder external rotation  3+/5  Middle trapezius    Lower trapezius    Elbow flexion    Elbow extension    Wrist flexion    Wrist extension    Wrist ulnar deviation    Wrist radial deviation    Wrist pronation    Wrist supination    Grip strength (lbs)  5/5  (Blank rows = not tested)  SHOULDER SPECIAL TESTS: Impingement tests: Neer impingement test: negative, Hawkins/Kennedy impingement test: positive , and Painful arc test: positive  SLAP lesions: Crank test: negative, Biceps load test: negative, and Clunk test: negative Rotator cuff  assessment: Empty can test: negative, Full can test: negative, and Infraspinatus test: negative AC compression (+) pain    PALPATION:  TTP across anterior shoulder, no TTP throughout neck or posterior lateral sides of shoulder joint   TODAY'S TREATMENT:                                                                                                                                         DATE:  03/13/23: Prone shoulder extension, rows and horizontal abd x 20 with  2 lbs left  Side lying ER x 20 with 2 lbs left Supine serratus punch x 20 left with  Supine shoulder alphabet A-Z with 2 lbs 3 way scapular stabilization with green loop x 10 (left) 4D ball rolls with light blue plyo ball x 20 each (left)  03/06/23: Prone shoulder extension, rows and horizontal abd x 20 Side lying ER x 20 Supine serratus punch x 20 IFC estim with ice to left shoulder x 15 min  03/04/23: Seated pulleys flexion x1 min, scaption x 1 min, horizontal shoulder abd x1 min Standing doorway stretch low and mid 2x30 sec each Standing row green TB 2x10 Standing shoulder ext green TB 2x10 Manual therapy: STM & TPR infraspinatus, supraspinatus Sidelying shoulder ER 2# eccentrics x10 but too much for pt, x10 no weight Infraspinatus stretch with hands behind back pulling elbow forward x 30 sec Crossbody stretch with internal rotation x30 sec Standing shoulder abd iso 10x5 sec Standing shoulder flexion iso 10x5 sec Manual therapy: STM &  TPR infraspinatus and supraspinatus muscle 4 hour ionto patch dexamethasone on infraspinatus tendon  02/26/23: Seated pulleys flexion x 2 min, scaption x 1 min for warm up Seated scap squeeze 10x5 sec Pendulums CW & CCW x10 each Manual therapy: STM & TPR pec major and minor, PROM shoulder flexion, scaption, horizontal abd to pt tolerance Supine hands behind head pec stretch x 1 min Seated shoulder ER 2x10 with scap squeeze Standing row red TB 2x10  Standing shoulder ext red TB  2x10 Self care: STM & TPR pecs with tennis ball   02/19/23: Attempted W in supine but pt  unable without significant increase in pain.  Sidelying Lt horizontal abduction x8 reps Prone Lt scap retraction x10 reps, PT cuing to avoid  Seated shoulder rolls x10 reps forward/backward Lt shoulder wall isometrics flexion, abduction, ER, IR within minimal pain range 5x10 sec hold  Lt straight arm flexion isometric hold against wall 5x10 sec hold Lt bent arm abduction iso hold against wall 4x10 sec hold  Seated Lt shoulder flexion A/AROM x10 Seated Lt Abduction A/AROM x6, moved to supine x8 reps    Examination completed, findings reviewed, pt educated on POC, HEP. Pt motivated to participate in PT and agreeable to attempt recommendations.    PATIENT EDUCATION: Education details: updates to HEP; technique with therex Person educated: Patient Education method: Explanation, Demonstration, Tactile cues, Verbal cues, and Handouts Education comprehension: verbalized understanding, returned demonstration, verbal cues required, tactile cues required, and needs further education  HOME EXERCISE PROGRAM: Access Code: [email or web address] URL: [email or web address] Date: 03/06/2023 Prepared by: Mikey Kirschner  Exercises - Isometric Shoulder Abduction at Wall  - 2 x daily - 7 x weekly - 5 reps - 10 second hold - Isometric Shoulder Flexion at Wall  - 2 x daily - 7 x weekly - 5 reps - 10 second hold - Standing Bilateral Low Shoulder Row with Anchored Resistance  - 1 x daily - 7 x weekly - 2 sets - 10 reps - 5 sec hold - Shoulder extension with resistance - Neutral  - 1 x daily - 7 x weekly - 2 sets - 10 reps - 5 sec hold - Prone Shoulder Extension - Single Arm  - 2 x daily - 7 x weekly - 2 sets - 10 reps - Prone Shoulder Row  - 2 x daily - 7 x weekly - 2 sets - 10 reps - Prone Single Arm Shoulder Horizontal Abduction with Scapular Retraction and Palm Down  - 2 x daily - 7 x weekly - 2 sets - 10  reps - Sidelying Shoulder External Rotation  - 2 x daily - 7 x weekly - 2 sets - 10 reps - Single Arm Serratus Punches in Supine with Dumbbell  - 2 x daily - 7 x weekly - 2 sets - 10 reps - Standing Shoulder Flexion to 90 Degrees with Dumbbells  - 2 x daily - 7 x weekly - 2 sets - 10 reps - Standing Shoulder Scaption  - 2 x daily - 7 x weekly - 2 sets - 10 reps  Patient Education - Ionto Patient Instructions ASSESSMENT:  CLINICAL IMPRESSION:  Sherrill Raring responded well to todays treatment.  He was able to complete all tasks with no increased pain.  He should continue to do well.     OBJECTIVE IMPAIRMENTS: decreased activity tolerance, decreased endurance, decreased ROM, decreased strength, increased muscle spasms, impaired flexibility, improper body mechanics, postural dysfunction, and pain.    GOALS: Goals reviewed with patient? Yes  SHORT TERM GOALS: Target date: 03/05/23  Pt to be I with HEP.  Baseline: Goal status: INITIAL  2.  Pt to demonstrate improved posture for at least 30 mins to decrease pain and tension at shoulder.  Baseline:  Goal status: INITIAL  3.  Pt will report 25% reduction of pain due to improvements in posture, strength, and muscle length  Baseline: 8/10 Goal status: INITIAL   LONG TERM GOALS: Target date: 05/08/23  Pt to be I with advanced HEP.  Baseline:  Goal status: INITIAL  2.  Pt to demonstrate improved ROM at Lt shoulder to full range with abduction with no more than 2 strength grade increase in pain for improved mobility at work  Baseline:  Goal status: INITIAL  3.  Pt will report 50% reduction of pain due to improvements in posture, strength, and muscle length  Baseline:  Goal status: INITIAL  4.  Pt to demonstrate ability to lift at least 50# without increase of pain more than 2 strength grades to return to lifting batteries for ambulances.   Baseline:  Goal status: INITIAL  5.  Complete FOTO at second session.  Baseline:  Goal status:  INITIAL    PLAN:  PT FREQUENCY: 2x/week  PT DURATION: 8 weeks  PLANNED INTERVENTIONS: Therapeutic exercises, Therapeutic activity, Neuromuscular re-education, Patient/Family education, Self Care, Joint mobilization, Joint manipulation, DME instructions, Aquatic Therapy, Dry Needling, Electrical stimulation, Spinal mobilization, Cryotherapy, Moist heat, Taping, Vasopneumatic device, Traction, Ionotophoresis 4mg /ml Dexamethasone, and Manual therapy  PLAN FOR NEXT SESSION: Patient will see Dr. Ave Filter for f/u Monday.  We will proceed based on MD findings.     Victorino Dike B. Emmajean Ratledge, PT 03/06/23 11:46 PM [facility] [address], [address] [place], [place] [address] Phone # [phone] Fax [phone]

## 2023-03-18 ENCOUNTER — Ambulatory Visit: Payer: PRIVATE HEALTH INSURANCE | Admitting: Physical Therapy

## 2023-03-20 ENCOUNTER — Ambulatory Visit: Payer: PRIVATE HEALTH INSURANCE

## 2023-03-20 DIAGNOSIS — M6281 Muscle weakness (generalized): Secondary | ICD-10-CM | POA: Diagnosis not present

## 2023-03-20 DIAGNOSIS — M25512 Pain in left shoulder: Secondary | ICD-10-CM

## 2023-03-20 DIAGNOSIS — R252 Cramp and spasm: Secondary | ICD-10-CM

## 2023-03-20 DIAGNOSIS — R293 Abnormal posture: Secondary | ICD-10-CM

## 2023-03-20 NOTE — Therapy (Signed)
OUTPATIENT PHYSICAL THERAPY SHOULDER TREATMENT   Patient Name: Bruce Simmons MRN: [id number] DOB:06-07-1976, 47 y.o., male Today's Date: 03/20/2023  END OF SESSION:  PT End of Session - 03/20/23 1544     Visit Number 7    Date for PT Re-Evaluation 05/08/23    Authorization Type cone aetna    PT Start Time 1537    PT Stop Time 1616    PT Time Calculation (min) 39 min    Activity Tolerance Patient tolerated treatment well    Behavior During Therapy WFL for tasks assessed/performed             Past Medical History:  Diagnosis Date   GERD (gastroesophageal reflux disease)    Seasonal allergies    Sleep apnea    Sleep-related bruxism    History reviewed. No pertinent surgical history. Patient Active Problem List   Diagnosis Date Noted   OSA (obstructive sleep apnea) 11/11/2013    PCP: Erskine Speed, NP  REFERRING PROVIDER: Jones Broom, MD  REFERRING DIAG: Left shoulder pain  THERAPY DIAG:  Acute pain of left shoulder  Muscle weakness (generalized)  Abnormal posture  Cramp and spasm  Rationale for Evaluation and Treatment: Rehabilitation  ONSET DATE: 8 weeks ago  SUBJECTIVE:                                                                                                                                                                                      SUBJECTIVE STATEMENT: Pt states he had some return of pain in the left shoulder.   He demonstrates impingement symptoms but he admits they are mild compared to the pain prior to the injection.   Hand dominance: Right  PERTINENT HISTORY: Nothing male note   PAIN:  Are you having pain? Yes: NPRS scale: 1 at lowest and gets up to 8/10 Pain location: Lt anterior shoulder  Pain description: sharp lasting as long as he's in that motion  Aggravating factors: shoulder abduction, reaching over head, sometimes putting shirt. Relieving factors: rest, ice, ibuprofen   PRECAUTIONS: Other: no  push/pull/lift over 10#  WEIGHT BEARING RESTRICTIONS: No  FALLS:  Has patient fallen in last 6 months? No  LIVING ENVIRONMENT: Lives with: lives with their family Lives in: House/apartment  OCCUPATION: EMT  PLOF: Independent  PATIENT GOALS:to have less pain  NEXT MD VISIT: 02/21/23  OBJECTIVE:   DIAGNOSTIC FINDINGS:  X-ray (-) MRI IMPRESSION 03/02/23: 1. Moderate tendinosis of the supraspinatus tendon with a small partial-thickness bursal surface tear along the anterior-most aspect and a small insertional interstitial tears of the posterior most aspect. 2. Severe tendinosis of the infraspinatus tendon. 3. Mild tendinosis of  the intra-articular portion of the long head of the biceps tendon.  PATIENT SURVEYS:  FOTO:    POSTURE: Forward head, rounded shoulders, increased kyphosis   UPPER EXTREMITY ROM:  All right WFL without pain LT: shoulder abduction limited by 75%, flexion limited by 25%, IR 25% limited, extension and ER WFL - all mobility with pain abduction most painful    UPPER EXTREMITY MMT: Rt all 5/5  MMT Right eval Left eval  Shoulder flexion  4/5 within range though limited due to pain   Shoulder extension    Shoulder abduction  Unable to hold resistance and very limited due to pain  Shoulder adduction  3+/5  Shoulder internal rotation  Unable to hold resistance and very limited due to pain  Shoulder external rotation  3+/5  Middle trapezius    Lower trapezius    Elbow flexion    Elbow extension    Wrist flexion    Wrist extension    Wrist ulnar deviation    Wrist radial deviation    Wrist pronation    Wrist supination    Grip strength (lbs)  5/5  (Blank rows = not tested)  SHOULDER SPECIAL TESTS: Impingement tests: Neer impingement test: negative, Hawkins/Kennedy impingement test: positive , and Painful arc test: positive  SLAP lesions: Crank test: negative, Biceps load test: negative, and Clunk test: negative Rotator cuff assessment:  Empty can test: negative, Full can test: negative, and Infraspinatus test: negative AC compression (+) pain    PALPATION:  TTP across anterior shoulder, no TTP throughout neck or posterior lateral sides of shoulder joint   TODAY'S TREATMENT:                                                                                                                                         DATE:  03/20/23: UBE x 4 min fwd only Prone shoulder extension, rows and horizontal abd x 20 with  3 lbs left  Side lying ER x 20 with 3 lbs left Supine serratus punch x 20 left with 3 lbs Supine shoulder alphabet A-Z with 3 lbs 3 way scapular stabilization with green loop x 10 (left) 4D ball rolls with light blue plyo ball x 20 each (left) PROM: all planes along with shoulder mobs posterior and inferior and post capsule stretch  Ice to left shoulder seated with bolsters under shoulder to elevate to 90 degrees x 10 min  03/13/23: Prone shoulder extension, rows and horizontal abd x 20 with  2 lbs left  Side lying ER x 20 with 2 lbs left Supine serratus punch x 20 left with  Supine shoulder alphabet A-Z with 2 lbs 3 way scapular stabilization with green loop x 10 (left) 4D ball rolls with light blue plyo ball x 20 each (left) Checked PROM: all WNL without pain Ice to left shoulder seated with bolsters under shoulder to elevate to 90 degrees x  10 min  03/06/23: Prone shoulder extension, rows and horizontal abd x 20 Side lying ER x 20 Supine serratus punch x 20 IFC estim with ice to left shoulder x 15 min  03/04/23: Seated pulleys flexion x1 min, scaption x 1 min, horizontal shoulder abd x1 min Standing doorway stretch low and mid 2x30 sec each Standing row green TB 2x10 Standing shoulder ext green TB 2x10 Manual therapy: STM & TPR infraspinatus, supraspinatus Sidelying shoulder ER 2# eccentrics x10 but too much for pt, x10 no weight Infraspinatus stretch with hands behind back pulling elbow forward x 30  sec Crossbody stretch with internal rotation x30 sec Standing shoulder abd iso 10x5 sec Standing shoulder flexion iso 10x5 sec Manual therapy: STM & TPR infraspinatus and supraspinatus muscle 4 hour ionto patch dexamethasone on infraspinatus tendon   Examination completed, findings reviewed, pt educated on POC, HEP. Pt motivated to participate in PT and agreeable to attempt recommendations.    PATIENT EDUCATION: Education details: updates to HEP; technique with therex Person educated: Patient Education method: Explanation, Demonstration, Tactile cues, Verbal cues, and Handouts Education comprehension: verbalized understanding, returned demonstration, verbal cues required, tactile cues required, and needs further education  HOME EXERCISE PROGRAM: Access Code: [email or web address] URL: [email or web address] Date: 03/20/2023 Prepared by: Mikey Kirschner  Exercises - Isometric Shoulder Abduction at Wall  - 2 x daily - 7 x weekly - 5 reps - 10 second hold - Isometric Shoulder Flexion at Wall  - 2 x daily - 7 x weekly - 5 reps - 10 second hold - Standing Bilateral Low Shoulder Row with Anchored Resistance  - 1 x daily - 7 x weekly - 2 sets - 10 reps - 5 sec hold - Shoulder extension with resistance - Neutral  - 1 x daily - 7 x weekly - 2 sets - 10 reps - 5 sec hold - Prone Shoulder Extension - Single Arm  - 2 x daily - 7 x weekly - 2 sets - 10 reps - Prone Shoulder Row  - 2 x daily - 7 x weekly - 2 sets - 10 reps - Prone Single Arm Shoulder Horizontal Abduction with Scapular Retraction and Palm Down  - 2 x daily - 7 x weekly - 2 sets - 10 reps - Sidelying Shoulder External Rotation  - 2 x daily - 7 x weekly - 2 sets - 10 reps - Single Arm Serratus Punches in Supine with Dumbbell  - 2 x daily - 7 x weekly - 2 sets - 10 reps - Standing Shoulder Flexion to 90 Degrees with Dumbbells  - 2 x daily - 7 x weekly - 2 sets - 10 reps - Standing Shoulder Scaption  - 2 x daily - 7 x weekly - 2 sets -  10 reps - Sleeper Stretch  - 1 x daily - 7 x weekly - 1 sets - 10 reps - 5-10 sec hold - Sidelying Posterior Cuff Cross Body Stretch  - 1 x daily - 7 x weekly - 1 sets - 10 reps - 5-10 sec hold  Patient Education - Ionto Patient Instructions Patient Education - [place]onto Patient Instructions ASSESSMENT:  CLINICAL IMPRESSION:  Rusty had some return of impingement symptoms since injection.  These appear to be mild to moderate vs severe as they were prior to the injection.  He was able to tolerate 3 lb on all prone, side lying and supine shoulder exercises.  Post treatment, he states he felt "good".     He would benefit from  continued skilled PT to focus on restoring normal shoulder arthrokinematics.  OBJECTIVE IMPAIRMENTS: decreased activity tolerance, decreased endurance, decreased ROM, decreased strength, increased muscle spasms, impaired flexibility, improper body mechanics, postural dysfunction, and pain.    GOALS: Goals reviewed with patient? Yes  SHORT TERM GOALS: Target date: 03/05/23  Pt to be I with HEP.  Baseline: Goal status: MET  2.  Pt to demonstrate improved posture for at least 30 mins to decrease pain and tension at shoulder.  Baseline:  Goal status: MET  3.  Pt will report 25% reduction of pain due to improvements in posture, strength, and muscle length  Baseline: 8/10 Goal status: MET   LONG TERM GOALS: Target date: 05/08/23  Pt to be I with advanced HEP.  Baseline:  Goal status: INITIAL  2.  Pt to demonstrate improved ROM at Lt shoulder to full range with abduction with no more than 2 strength grade increase in pain for improved mobility at work  Baseline:  Goal status: INITIAL  3.  Pt will report 50% reduction of pain due to improvements in posture, strength, and muscle length  Baseline:  Goal status: INITIAL  4.  Pt to demonstrate ability to lift at least 50# without increase of pain more than 2 strength grades to return to lifting batteries for ambulances.    Baseline:  Goal status: INITIAL  5.  Complete FOTO at second session.  Baseline:  Goal status: INITIAL    PLAN:  PT FREQUENCY: 2x/week  PT DURATION: 8 weeks  PLANNED INTERVENTIONS: Therapeutic exercises, Therapeutic activity, Neuromuscular re-education, Patient/Family education, Self Care, Joint mobilization, Joint manipulation, DME instructions, Aquatic Therapy, Dry Needling, Electrical stimulation, Spinal mobilization, Cryotherapy, Moist heat, Taping, Vasopneumatic device, Traction, Ionotophoresis 4mg /ml Dexamethasone, and Manual therapy  PLAN FOR NEXT SESSION: Proceed with rotator cuff strengthening and scapular stabilization.  Prone shoulder ext, rows and horizontal abduction with 3 lbs, side lying ER with 3 lbs, supine serratus punch with 3 lbs,  3 way scap stab, 4D ball rolls, could add prone shoulder drop and catch for rhomboids.  Could also add standing flexion and scaption with 2 lbs in pain free range.  Ice at end of session.      Victorino Dike B. Alorah Mcree, PT 03/20/23 4:17 PM  [facility] [address] [place], [place] [address] Phone # [phone] Fax [phone]

## 2023-03-24 NOTE — Therapy (Signed)
OUTPATIENT PHYSICAL THERAPY SHOULDER TREATMENT   Patient Name: Bruce Simmons MRN: [id number] DOB:02/14/1976, 47 y.o., male Today's Date: 03/25/2023  END OF SESSION:  PT End of Session - 03/25/23 1159     Visit Number 8    Date for PT Re-Evaluation 05/08/23    Authorization Type cone aetna    PT Start Time 1154   pt arrived late   PT Stop Time 1230    PT Time Calculation (min) 36 min    Activity Tolerance Patient tolerated treatment well    Behavior During Therapy WFL for tasks assessed/performed              Past Medical History:  Diagnosis Date   GERD (gastroesophageal reflux disease)    Seasonal allergies    Sleep apnea    Sleep-related bruxism    History reviewed. No pertinent surgical history. Patient Active Problem List   Diagnosis Date Noted   OSA (obstructive sleep apnea) 11/11/2013    PCP: Erskine Speed, NP  REFERRING PROVIDER: Jones Broom, MD  REFERRING DIAG: Left shoulder pain  THERAPY DIAG:  Acute pain of left shoulder  Muscle weakness (generalized)  Abnormal posture  Cramp and spasm  Other muscle spasm  Unspecified lack of coordination  Rationale for Evaluation and Treatment: Rehabilitation  ONSET DATE: 8 weeks ago  SUBJECTIVE:                                                                                                                                                                                      SUBJECTIVE STATEMENT: Pt states that his HEP is going well. Had some pain return since the injection, but not as bad as before.  Hand dominance: Right  PERTINENT HISTORY: Nothing male note   PAIN:  Are you having pain? Yes: NPRS scale: 0 at lowest and gets up to 8/10 Pain location: Lt anterior shoulder  Pain description: sharp lasting as long as he's in that motion  Aggravating factors: shoulder abduction, reaching over head, sometimes putting shirt. Relieving factors: rest, ice, ibuprofen   PRECAUTIONS: Other:  no push/pull/lift over 10#  WEIGHT BEARING RESTRICTIONS: No  FALLS:  Has patient fallen in last 6 months? No  LIVING ENVIRONMENT: Lives with: lives with their family Lives in: House/apartment  OCCUPATION: EMT  PLOF: Independent  PATIENT GOALS:to have less pain  NEXT MD VISIT: 02/21/23  OBJECTIVE:   DIAGNOSTIC FINDINGS:  X-ray (-) MRI IMPRESSION 03/02/23: 1. Moderate tendinosis of the supraspinatus tendon with a small partial-thickness bursal surface tear along the anterior-most aspect and a small insertional interstitial tears of the posterior most aspect. 2. Severe tendinosis of the infraspinatus tendon. 3. Mild  tendinosis of the intra-articular portion of the long head of the biceps tendon.  PATIENT SURVEYS:  FOTO:    POSTURE: Forward head, rounded shoulders, increased kyphosis   UPPER EXTREMITY ROM:  All right WFL without pain LT: shoulder abduction limited by 75%, flexion limited by 25%, IR 25% limited, extension and ER WFL - all mobility with pain abduction most painful    UPPER EXTREMITY MMT: Rt all 5/5  MMT Right eval Left eval  Shoulder flexion  4/5 within range though limited due to pain   Shoulder extension    Shoulder abduction  Unable to hold resistance and very limited due to pain  Shoulder adduction  3+/5  Shoulder internal rotation  Unable to hold resistance and very limited due to pain  Shoulder external rotation  3+/5  Middle trapezius    Lower trapezius    Elbow flexion    Elbow extension    Wrist flexion    Wrist extension    Wrist ulnar deviation    Wrist radial deviation    Wrist pronation    Wrist supination    Grip strength (lbs)  5/5  (Blank rows = not tested)  SHOULDER SPECIAL TESTS: Impingement tests: Neer impingement test: negative, Hawkins/Kennedy impingement test: positive , and Painful arc test: positive  SLAP lesions: Crank test: negative, Biceps load test: negative, and Clunk test: negative Rotator cuff assessment:  Empty can test: negative, Full can test: negative, and Infraspinatus test: negative AC compression (+) pain    PALPATION:  TTP across anterior shoulder, no TTP throughout neck or posterior lateral sides of shoulder joint   TODAY'S TREATMENT:                                                                                                                                         DATE:  03/25/23 Rt sidelying Lt ER #3 2x10 Supine "bear hugs" red TB x20 reps  Prone Lt shoulder extension, middle rows, horizontal abd x20 reps, #3 Prone Lt shoulder I and T x15 reps  Standing Lt shoulder ER to neutral with press forward, yellow TB 2x10 reps  Standing Lt shoulder IR yellow TB 2x10 Supine BUE flexion yellow TB around wrists for posterior shoulder activation through motion Supine Lt UE D2 extension x10 reps   03/20/23: UBE x 4 min fwd only Prone shoulder extension, rows and horizontal abd x 20 with  3 lbs left  Side lying ER x 20 with 3 lbs left Supine serratus punch x 20 left with 3 lbs Supine shoulder alphabet A-Z with 3 lbs 3 way scapular stabilization with green loop x 10 (left) 4D ball rolls with light blue plyo ball x 20 each (left) PROM: all planes along with shoulder mobs posterior and inferior and post capsule stretch  Ice to left shoulder seated with bolsters under shoulder to elevate to 90 degrees x 10 min  03/13/23: Prone shoulder  extension, rows and horizontal abd x 20 with  2 lbs left  Side lying ER x 20 with 2 lbs left Supine serratus punch x 20 left with  Supine shoulder alphabet A-Z with 2 lbs 3 way scapular stabilization with green loop x 10 (left) 4D ball rolls with light blue plyo ball x 20 each (left) Checked PROM: all WNL without pain Ice to left shoulder seated with bolsters under shoulder to elevate to 90 degrees x 10 min  03/06/23: Prone shoulder extension, rows and horizontal abd x 20 Side lying ER x 20 Supine serratus punch x 20 IFC estim with ice to left shoulder  x 15 min  03/04/23: Seated pulleys flexion x1 min, scaption x 1 min, horizontal shoulder abd x1 min Standing doorway stretch low and mid 2x30 sec each Standing row green TB 2x10 Standing shoulder ext green TB 2x10 Manual therapy: STM & TPR infraspinatus, supraspinatus Sidelying shoulder ER 2# eccentrics x10 but too much for pt, x10 no weight Infraspinatus stretch with hands behind back pulling elbow forward x 30 sec Crossbody stretch with internal rotation x30 sec Standing shoulder abd iso 10x5 sec Standing shoulder flexion iso 10x5 sec Manual therapy: STM & TPR infraspinatus and supraspinatus muscle 4 hour ionto patch dexamethasone on infraspinatus tendon   Examination completed, findings reviewed, pt educated on POC, HEP. Pt motivated to participate in PT and agreeable to attempt recommendations.    PATIENT EDUCATION: Education details: technique with therex; ice at work following session Person educated: Patient Education method: Programmer, multimedia, Facilities manager, Actor cues, Verbal cues, and Handouts Education comprehension: verbalized understanding, returned demonstration, verbal cues required, tactile cues required, and needs further education  HOME EXERCISE PROGRAM: Access Code: [email or web address] URL: [email or web address] Date: 03/20/2023 Prepared by: Mikey Kirschner  Exercises - Isometric Shoulder Abduction at Wall  - 2 x daily - 7 x weekly - 5 reps - 10 second hold - Isometric Shoulder Flexion at Wall  - 2 x daily - 7 x weekly - 5 reps - 10 second hold - Standing Bilateral Low Shoulder Row with Anchored Resistance  - 1 x daily - 7 x weekly - 2 sets - 10 reps - 5 sec hold - Shoulder extension with resistance - Neutral  - 1 x daily - 7 x weekly - 2 sets - 10 reps - 5 sec hold - Prone Shoulder Extension - Single Arm  - 2 x daily - 7 x weekly - 2 sets - 10 reps - Prone Shoulder Row  - 2 x daily - 7 x weekly - 2 sets - 10 reps - Prone Single Arm Shoulder Horizontal Abduction  with Scapular Retraction and Palm Down  - 2 x daily - 7 x weekly - 2 sets - 10 reps - Sidelying Shoulder External Rotation  - 2 x daily - 7 x weekly - 2 sets - 10 reps - Single Arm Serratus Punches in Supine with Dumbbell  - 2 x daily - 7 x weekly - 2 sets - 10 reps - Standing Shoulder Flexion to 90 Degrees with Dumbbells  - 2 x daily - 7 x weekly - 2 sets - 10 reps - Standing Shoulder Scaption  - 2 x daily - 7 x weekly - 2 sets - 10 reps - Sleeper Stretch  - 1 x daily - 7 x weekly - 1 sets - 10 reps - 5-10 sec hold - Sidelying Posterior Cuff Cross Body Stretch  - 1 x daily - 7 x weekly - 1 sets - 10  reps - 5-10 sec hold  Patient Education - Ionto Patient Instructions Patient Education - Ionto Patient Instructions ASSESSMENT:  CLINICAL IMPRESSION: Today's session continued with therex to promote rotator cuff strength. Pt denied increase in shoulder pain with all exercises. Pt has notable weakness with Lt shoulder IR on shoulder diagonal exercise. He was able to complete standing resisted shoulder IR without pain and could benefit from adding this to his HEP next session.   OBJECTIVE IMPAIRMENTS: decreased activity tolerance, decreased endurance, decreased ROM, decreased strength, increased muscle spasms, impaired flexibility, improper body mechanics, postural dysfunction, and pain.    GOALS: Goals reviewed with patient? Yes  SHORT TERM GOALS: Target date: 03/05/23  Pt to be I with HEP.  Baseline: Goal status: MET  2.  Pt to demonstrate improved posture for at least 30 mins to decrease pain and tension at shoulder.  Baseline:  Goal status: MET  3.  Pt will report 25% reduction of pain due to improvements in posture, strength, and muscle length  Baseline: 8/10 Goal status: MET   LONG TERM GOALS: Target date: 05/08/23  Pt to be I with advanced HEP.  Baseline:  Goal status: INITIAL  2.  Pt to demonstrate improved ROM at Lt shoulder to full range with abduction with no more than 2  strength grade increase in pain for improved mobility at work  Baseline:  Goal status: INITIAL  3.  Pt will report 50% reduction of pain due to improvements in posture, strength, and muscle length  Baseline:  Goal status: INITIAL  4.  Pt to demonstrate ability to lift at least 50# without increase of pain more than 2 strength grades to return to lifting batteries for ambulances.   Baseline:  Goal status: INITIAL  5.  Complete FOTO at second session.  Baseline:  Goal status: INITIAL    PLAN:  PT FREQUENCY: 2x/week  PT DURATION: 8 weeks  PLANNED INTERVENTIONS: Therapeutic exercises, Therapeutic activity, Neuromuscular re-education, Patient/Family education, Self Care, Joint mobilization, Joint manipulation, DME instructions, Aquatic Therapy, Dry Needling, Electrical stimulation, Spinal mobilization, Cryotherapy, Moist heat, Taping, Vasopneumatic device, Traction, Ionotophoresis 4mg /ml Dexamethasone, and Manual therapy  PLAN FOR NEXT SESSION: Proceed with rotator cuff strengthening and scapular stabilization. Possibly add shoulder IR and horizontal adduction strengthening to HEP; Prone shoulder ext, rows and horizontal abduction with 3 lbs, side lying ER with 3 lbs, supine serratus punch with 3 lbs,  3 way scap stab, 4D ball rolls, could add prone shoulder drop and catch for rhomboids.  Could also add standing flexion and scaption with 2 lbs in pain free range.  Ice at end of session.      2:21 PM,03/25/23 Donita Brooks PT, DPT [facility] [place]  [phone]

## 2023-03-25 ENCOUNTER — Encounter: Payer: Self-pay | Admitting: Physical Therapy

## 2023-03-25 ENCOUNTER — Ambulatory Visit: Payer: PRIVATE HEALTH INSURANCE | Admitting: Physical Therapy

## 2023-03-25 DIAGNOSIS — M62838 Other muscle spasm: Secondary | ICD-10-CM

## 2023-03-25 DIAGNOSIS — M25512 Pain in left shoulder: Secondary | ICD-10-CM

## 2023-03-25 DIAGNOSIS — R252 Cramp and spasm: Secondary | ICD-10-CM

## 2023-03-25 DIAGNOSIS — R279 Unspecified lack of coordination: Secondary | ICD-10-CM

## 2023-03-25 DIAGNOSIS — R293 Abnormal posture: Secondary | ICD-10-CM

## 2023-03-25 DIAGNOSIS — M6281 Muscle weakness (generalized): Secondary | ICD-10-CM

## 2023-03-27 ENCOUNTER — Ambulatory Visit: Payer: PRIVATE HEALTH INSURANCE

## 2023-03-27 DIAGNOSIS — R252 Cramp and spasm: Secondary | ICD-10-CM

## 2023-03-27 DIAGNOSIS — M6281 Muscle weakness (generalized): Secondary | ICD-10-CM

## 2023-03-27 DIAGNOSIS — M25512 Pain in left shoulder: Secondary | ICD-10-CM

## 2023-03-27 DIAGNOSIS — R293 Abnormal posture: Secondary | ICD-10-CM

## 2023-03-27 NOTE — Therapy (Deleted)
OUTPATIENT PHYSICAL THERAPY SHOULDER TREATMENT   Patient Name: Bruce Simmons MRN: [id number] DOB:11/27/1975, 47 y.o., male Today's Date: 03/27/2023  END OF SESSION:  PT End of Session - 03/27/23 1540     Visit Number 9    Date for PT Re-Evaluation 05/08/23    Authorization Type cone aetna    PT Start Time 1535    Activity Tolerance Patient tolerated treatment well    Behavior During Therapy [facility] for tasks assessed/performed              Past Medical History:  Diagnosis Date   GERD (gastroesophageal reflux disease)    Seasonal allergies    Sleep apnea    Sleep-related bruxism    History reviewed. No pertinent surgical history. Patient Active Problem List   Diagnosis Date Noted   OSA (obstructive sleep apnea) 11/11/2013    PCP: Erskine Speed, NP  REFERRING PROVIDER: Jones Broom, MD  REFERRING DIAG: Left shoulder pain  THERAPY DIAG:  Acute pain of left shoulder  Muscle weakness (generalized)  Abnormal posture  Cramp and spasm  Rationale for Evaluation and Treatment: Rehabilitation  ONSET DATE: 8 weeks ago  SUBJECTIVE:                                                                                                                                                                                      SUBJECTIVE STATEMENT: Pt states that his HEP is going well. Had some pain return since the injection, but not as bad as before.  Hand dominance: Right  PERTINENT HISTORY: Nothing male note   PAIN:  Are you having pain? Yes: NPRS scale: 0 at lowest and gets up to 8/10 Pain location: Lt anterior shoulder  Pain description: sharp lasting as long as he's in that motion  Aggravating factors: shoulder abduction, reaching over head, sometimes putting shirt. Relieving factors: rest, ice, ibuprofen   PRECAUTIONS: Other: no push/pull/lift over 10#  WEIGHT BEARING RESTRICTIONS: No  FALLS:  Has patient fallen in last 6 months? No  LIVING  ENVIRONMENT: Lives with: lives with their family Lives in: House/apartment  OCCUPATION: EMT  PLOF: Independent  PATIENT GOALS:to have less pain  NEXT MD VISIT: 02/21/23  OBJECTIVE:   DIAGNOSTIC FINDINGS:  X-ray (-) MRI IMPRESSION 03/02/23: 1. Moderate tendinosis of the supraspinatus tendon with a small partial-thickness bursal surface tear along the anterior-most aspect and a small insertional interstitial tears of the posterior most aspect. 2. Severe tendinosis of the infraspinatus tendon. 3. Mild tendinosis of the intra-articular portion of the long head of the biceps tendon.  PATIENT SURVEYS:  FOTO:    POSTURE: Forward head, rounded shoulders, increased kyphosis  UPPER EXTREMITY ROM:  All right [facility] without pain LT: shoulder abduction limited by 75%, flexion limited by 25%, IR 25% limited, extension and ER WFL - all mobility with pain abduction most painful    UPPER EXTREMITY MMT: Rt all 5/5  MMT Right eval Left eval  Shoulder flexion  4/5 within range though limited due to pain   Shoulder extension    Shoulder abduction  Unable to hold resistance and very limited due to pain  Shoulder adduction  3+/5  Shoulder internal rotation  Unable to hold resistance and very limited due to pain  Shoulder external rotation  3+/5  Middle trapezius    Lower trapezius    Elbow flexion    Elbow extension    Wrist flexion    Wrist extension    Wrist ulnar deviation    Wrist radial deviation    Wrist pronation    Wrist supination    Grip strength (lbs)  5/5  (Blank rows = not tested)  SHOULDER SPECIAL TESTS: Impingement tests: Neer impingement test: negative, Hawkins/Kennedy impingement test: positive , and Painful arc test: positive  SLAP lesions: Crank test: negative, Biceps load test: negative, and Clunk test: negative Rotator cuff assessment: Empty can test: negative, Full can test: negative, and Infraspinatus test: negative AC compression (+) pain     PALPATION:  TTP across anterior shoulder, no TTP throughout neck or posterior lateral sides of shoulder joint   TODAY'S TREATMENT:                                                                                                                                         DATE:  03/27/23: UBE x 6 min (3/3) 3 way scapular stabilization with blue loop x 10 (left) 4D ball rolls with light blue plyo ball x 20 each (left) (verbal cues for straight arm position with isometric press while doing smaller oscillations) Prone shoulder extension, rows and horizontal abd x 20 with  4 lbs left  Prone drop and catch with light blue plyo ball 2 x 20 Side lying ER x 20 with 4 lbs left,  then x 20 with 0# Supine serratus punch x 20 left with 4 lbs Supine shoulder alphabet A-Z with 4 lbs  Ice to left shoulder seated with bolsters under shoulder to elevate to 90 degrees x 10 min  03/25/23 Rt sidelying Lt ER #3 2x10 Supine "bear hugs" red TB x20 reps  Prone Lt shoulder extension, middle rows, horizontal abd x20 reps, #3 Prone Lt shoulder I and T x15 reps  Standing Lt shoulder ER to neutral with press forward, yellow TB 2x10 reps  Standing Lt shoulder IR yellow TB 2x10 Supine BUE flexion yellow TB around wrists for posterior shoulder activation through motion Supine Lt UE D2 extension x10 reps   03/20/23: UBE x 4 min fwd only Prone shoulder extension, rows and horizontal abd x 20 with  3 lbs left  Side lying ER x 20 with 3 lbs left Supine serratus punch x 20 left with 3 lbs Supine shoulder alphabet A-Z with 3 lbs 3 way scapular stabilization with green loop x 10 (left) 4D ball rolls with light blue plyo ball x 20 each (left) PROM: all planes along with shoulder mobs posterior and inferior and post capsule stretch  Ice to left shoulder seated with bolsters under shoulder to elevate to 90 degrees x 10 min  03/13/23: Prone shoulder extension, rows and horizontal abd x 20 with  2 lbs left  Side lying ER x  20 with 2 lbs left Supine serratus punch x 20 left with  Supine shoulder alphabet A-Z with 2 lbs 3 way scapular stabilization with green loop x 10 (left) 4D ball rolls with light blue plyo ball x 20 each (left) Checked PROM: all WNL without pain Ice to left shoulder seated with bolsters under shoulder to elevate to 90 degrees x 10 min  03/06/23: Prone shoulder extension, rows and horizontal abd x 20 Side lying ER x 20 Supine serratus punch x 20 IFC estim with ice to left shoulder x 15 min  03/04/23: Seated pulleys flexion x1 min, scaption x 1 min, horizontal shoulder abd x1 min Standing doorway stretch low and mid 2x30 sec each Standing row green TB 2x10 Standing shoulder ext green TB 2x10 Manual therapy: STM & TPR infraspinatus, supraspinatus Sidelying shoulder ER 2# eccentrics x10 but too much for pt, x10 no weight Infraspinatus stretch with hands behind back pulling elbow forward x 30 sec Crossbody stretch with internal rotation x30 sec Standing shoulder abd iso 10x5 sec Standing shoulder flexion iso 10x5 sec Manual therapy: STM & TPR infraspinatus and supraspinatus muscle 4 hour ionto patch dexamethasone on infraspinatus tendon   Examination completed, findings reviewed, pt educated on POC, HEP. Pt motivated to participate in PT and agreeable to attempt recommendations.    PATIENT EDUCATION: Education details: technique with therex; ice at work following session Person educated: Patient Education method: Programmer, multimedia, Facilities manager, Actor cues, Verbal cues, and Handouts Education comprehension: verbalized understanding, returned demonstration, verbal cues required, tactile cues required, and needs further education  HOME EXERCISE PROGRAM: Access Code: [email or web address] URL: [email or web address] Date: 03/20/2023 Prepared by: Mikey Kirschner  Exercises - Isometric Shoulder Abduction at Wall  - 2 x daily - 7 x weekly - 5 reps - 10 second hold - Isometric Shoulder  Flexion at Wall  - 2 x daily - 7 x weekly - 5 reps - 10 second hold - Standing Bilateral Low Shoulder Row with Anchored Resistance  - 1 x daily - 7 x weekly - 2 sets - 10 reps - 5 sec hold - Shoulder extension with resistance - Neutral  - 1 x daily - 7 x weekly - 2 sets - 10 reps - 5 sec hold - Prone Shoulder Extension - Single Arm  - 2 x daily - 7 x weekly - 2 sets - 10 reps - Prone Shoulder Row  - 2 x daily - 7 x weekly - 2 sets - 10 reps - Prone Single Arm Shoulder Horizontal Abduction with Scapular Retraction and Palm Down  - 2 x daily - 7 x weekly - 2 sets - 10 reps - Sidelying Shoulder External Rotation  - 2 x daily - 7 x weekly - 2 sets - 10 reps - Single Arm Serratus Punches in Supine with Dumbbell  - 2 x daily - 7 x weekly - 2 sets - 10  reps - Standing Shoulder Flexion to 90 Degrees with Dumbbells  - 2 x daily - 7 x weekly - 2 sets - 10 reps - Standing Shoulder Scaption  - 2 x daily - 7 x weekly - 2 sets - 10 reps - Sleeper Stretch  - 1 x daily - 7 x weekly - 1 sets - 10 reps - 5-10 sec hold - Sidelying Posterior Cuff Cross Body Stretch  - 1 x daily - 7 x weekly - 1 sets - 10 reps - 5-10 sec hold  Patient Education - Ionto Patient Instructions Patient Education - [place]onto Patient Instructions ASSESSMENT:  CLINICAL IMPRESSION: Today's session continued with therex to promote rotator cuff strength. Pt denied increase in shoulder pain with all exercises. Pt has notable weakness with Lt shoulder IR on shoulder diagonal exercise. He was able to complete standing resisted shoulder IR without pain and could benefit from adding this to his HEP next session.   OBJECTIVE IMPAIRMENTS: decreased activity tolerance, decreased endurance, decreased ROM, decreased strength, increased muscle spasms, impaired flexibility, improper body mechanics, postural dysfunction, and pain.    GOALS: Goals reviewed with patient? Yes  SHORT TERM GOALS: Target date: 03/05/23  Pt to be I with HEP.  Baseline: Goal  status: MET  2.  Pt to demonstrate improved posture for at least 30 mins to decrease pain and tension at shoulder.  Baseline:  Goal status: MET  3.  Pt will report 25% reduction of pain due to improvements in posture, strength, and muscle length  Baseline: 8/10 Goal status: MET   LONG TERM GOALS: Target date: 05/08/23  Pt to be I with advanced HEP.  Baseline:  Goal status: INITIAL  2.  Pt to demonstrate improved ROM at Lt shoulder to full range with abduction with no more than 2 strength grade increase in pain for improved mobility at work  Baseline:  Goal status: INITIAL  3.  Pt will report 50% reduction of pain due to improvements in posture, strength, and muscle length  Baseline:  Goal status: INITIAL  4.  Pt to demonstrate ability to lift at least 50# without increase of pain more than 2 strength grades to return to lifting batteries for ambulances.   Baseline:  Goal status: INITIAL  5.  Complete FOTO at second session.  Baseline:  Goal status: INITIAL    PLAN:  PT FREQUENCY: 2x/week  PT DURATION: 8 weeks  PLANNED INTERVENTIONS: Therapeutic exercises, Therapeutic activity, Neuromuscular re-education, Patient/Family education, Self Care, Joint mobilization, Joint manipulation, DME instructions, Aquatic Therapy, Dry Needling, Electrical stimulation, Spinal mobilization, Cryotherapy, Moist heat, Taping, Vasopneumatic device, Traction, Ionotophoresis 4mg /ml Dexamethasone, and Manual therapy  PLAN FOR NEXT SESSION: Proceed with rotator cuff strengthening and scapular stabilization. Possibly add shoulder IR and horizontal adduction strengthening to HEP; Prone shoulder ext, rows and horizontal abduction with 3 lbs, side lying ER with 3 lbs, supine serratus punch with 3 lbs,  3 way scap stab, 4D ball rolls, could add prone shoulder drop and catch for rhomboids.  Could also add standing flexion and scaption with 2 lbs in pain free range.  Ice at end of session.      3:41  PM,03/27/23 Donita Brooks PT, DPT [facility] [place]  [phone]

## 2023-03-27 NOTE — Therapy (Addendum)
OUTPATIENT PHYSICAL THERAPY SHOULDER TREATMENT AND LATE ENTRY DISCHARGE SUMMARY   Patient Name: Bruce Simmons MRN: [id number] DOB:12-11-1975, 47 y.o., male Today's Date: 03/27/2023  END OF SESSION:  PT End of Session - 03/27/23 1540     Visit Number 9    Date for PT Re-Evaluation 05/08/23    Authorization Type [employer]    PT Start Time 1535    PT Stop Time 1615    PT Time Calculation (min) 40 min    Activity Tolerance Patient tolerated treatment well    Behavior During Therapy WFL for tasks assessed/performed              Past Medical History:  Diagnosis Date   GERD (gastroesophageal reflux disease)    Seasonal allergies    Sleep apnea    Sleep-related bruxism    History reviewed. No pertinent surgical history. Patient Active Problem List   Diagnosis Date Noted   OSA (obstructive sleep apnea) 11/11/2013    PCP: Erskine Speed, NP  REFERRING PROVIDER: Jones Broom, MD  REFERRING DIAG: Left shoulder pain  THERAPY DIAG:  Acute pain of left shoulder  Muscle weakness (generalized)  Abnormal posture  Cramp and spasm  Rationale for Evaluation and Treatment: Rehabilitation  ONSET DATE: 8 weeks ago  SUBJECTIVE:                                                                                                                                                                                      SUBJECTIVE STATEMENT: Pt states that he continues to get pain with reaching out to the side.    Hand dominance: Right  PERTINENT HISTORY: Nothing male note   PAIN:  Are you having pain? Yes: NPRS scale: 0 at lowest and gets up to 8/10 Pain location: Lt anterior shoulder  Pain description: sharp lasting as long as he's in that motion  Aggravating factors: shoulder abduction, reaching over head, sometimes putting shirt. Relieving factors: rest, ice, ibuprofen   PRECAUTIONS: Other: no push/pull/lift over 10#  WEIGHT BEARING RESTRICTIONS: No  FALLS:   Has patient fallen in last 6 months? No  LIVING ENVIRONMENT: Lives with: lives with their family Lives in: House/apartment  OCCUPATION: EMT  PLOF: Independent  PATIENT GOALS:to have less pain  NEXT MD VISIT: 02/21/23  OBJECTIVE:   DIAGNOSTIC FINDINGS:  X-ray (-) MRI IMPRESSION 03/02/23: 1. Moderate tendinosis of the supraspinatus tendon with a small partial-thickness bursal surface tear along the anterior-most aspect and a small insertional interstitial tears of the posterior most aspect. 2. Severe tendinosis of the infraspinatus tendon. 3. Mild tendinosis of the intra-articular portion of the long head of the biceps tendon.  PATIENT SURVEYS:  FOTO:    POSTURE: Forward head, rounded shoulders, increased kyphosis   UPPER EXTREMITY ROM:  All right WFL without pain LT: shoulder abduction limited by 75%, flexion limited by 25%, IR 25% limited, extension and ER WFL - all mobility with pain abduction most painful    UPPER EXTREMITY MMT: Rt all 5/5  MMT Right eval Left eval  Shoulder flexion  4/5 within range though limited due to pain   Shoulder extension    Shoulder abduction  Unable to hold resistance and very limited due to pain  Shoulder adduction  3+/5  Shoulder internal rotation  Unable to hold resistance and very limited due to pain  Shoulder external rotation  3+/5  Middle trapezius    Lower trapezius    Elbow flexion    Elbow extension    Wrist flexion    Wrist extension    Wrist ulnar deviation    Wrist radial deviation    Wrist pronation    Wrist supination    Grip strength (lbs)  5/5  (Blank rows = not tested)  SHOULDER SPECIAL TESTS: Impingement tests: Neer impingement test: negative, Hawkins/Kennedy impingement test: positive , and Painful arc test: positive  SLAP lesions: Crank test: negative, Biceps load test: negative, and Clunk test: negative Rotator cuff assessment: Empty can test: negative, Full can test: negative, and Infraspinatus  test: negative AC compression (+) pain    PALPATION:  TTP across anterior shoulder, no TTP throughout neck or posterior lateral sides of shoulder joint   TODAY'S TREATMENT:                                                                                                                                         DATE:  03/27/23: UBE x 6 min (3/3) 3 way scapular stabilization with blue loop x 10 (left) 4D ball rolls with light blue plyo ball x 20 each (left) (verbal cues for straight arm position with isometric press while doing smaller oscillations) Prone shoulder extension, rows and horizontal abd x 20 with  4 lbs left  Prone drop and catch with light blue plyo ball 2 x 20 Side lying ER x 20 with 4 lbs left,  then x 20 with 0# Supine serratus punch x 20 left with 4 lbs Supine shoulder alphabet A-Z with 4 lbs  Ice to left shoulder seated with bolsters under shoulder to elevate to 90 degrees x 10 min  03/25/23 Rt sidelying Lt ER #3 2x10 Supine "bear hugs" red TB x20 reps  Prone Lt shoulder extension, middle rows, horizontal abd x20 reps, #3 Prone Lt shoulder I and T x15 reps  Standing Lt shoulder ER to neutral with press forward, yellow TB 2x10 reps  Standing Lt shoulder IR yellow TB 2x10 Supine BUE flexion yellow TB around wrists for posterior shoulder activation through motion Supine Lt UE D2 extension x10 reps   03/20/23:  UBE x 4 min fwd only Prone shoulder extension, rows and horizontal abd x 20 with  3 lbs left  Side lying ER x 20 with 3 lbs left Supine serratus punch x 20 left with 3 lbs Supine shoulder alphabet A-Z with 3 lbs 3 way scapular stabilization with green loop x 10 (left) 4D ball rolls with light blue plyo ball x 20 each (left) PROM: all planes along with shoulder mobs posterior and inferior and post capsule stretch  Ice to left shoulder seated with bolsters under shoulder to elevate to 90 degrees x 10 min   Examination completed, findings reviewed, pt educated on  POC, HEP. Pt motivated to participate in PT and agreeable to attempt recommendations.    PATIENT EDUCATION: Education details: technique with therex; ice at work following session Person educated: Patient Education method: Programmer, multimedia, Facilities manager, Actor cues, Verbal cues, and Handouts Education comprehension: verbalized understanding, returned demonstration, verbal cues required, tactile cues required, and needs further education  HOME EXERCISE PROGRAM: Access Code: [email or web address] URL: [email or web address] Date: 03/20/2023 Prepared by: Mikey Kirschner  Exercises - Isometric Shoulder Abduction at Wall  - 2 x daily - 7 x weekly - 5 reps - 10 second hold - Isometric Shoulder Flexion at Wall  - 2 x daily - 7 x weekly - 5 reps - 10 second hold - Standing Bilateral Low Shoulder Row with Anchored Resistance  - 1 x daily - 7 x weekly - 2 sets - 10 reps - 5 sec hold - Shoulder extension with resistance - Neutral  - 1 x daily - 7 x weekly - 2 sets - 10 reps - 5 sec hold - Prone Shoulder Extension - Single Arm  - 2 x daily - 7 x weekly - 2 sets - 10 reps - Prone Shoulder Row  - 2 x daily - 7 x weekly - 2 sets - 10 reps - Prone Single Arm Shoulder Horizontal Abduction with Scapular Retraction and Palm Down  - 2 x daily - 7 x weekly - 2 sets - 10 reps - Sidelying Shoulder External Rotation  - 2 x daily - 7 x weekly - 2 sets - 10 reps - Single Arm Serratus Punches in Supine with Dumbbell  - 2 x daily - 7 x weekly - 2 sets - 10 reps - Standing Shoulder Flexion to 90 Degrees with Dumbbells  - 2 x daily - 7 x weekly - 2 sets - 10 reps - Standing Shoulder Scaption  - 2 x daily - 7 x weekly - 2 sets - 10 reps - Sleeper Stretch  - 1 x daily - 7 x weekly - 1 sets - 10 reps - 5-10 sec hold - Sidelying Posterior Cuff Cross Body Stretch  - 1 x daily - 7 x weekly - 1 sets - 10 reps - 5-10 sec hold  Patient Education - Ionto Patient Instructions Patient Education - Ionto Patient  Instructions ASSESSMENT:  CLINICAL IMPRESSION: Toney Sang is able to complete scapular stabilization activities today with moderate fatigue but no pain with increased resistance.  He continues to have some pain at 90 degrees abduction and flexion but his symptoms are improved from last session and he continues to progress.  He is extremely weak in scapular stabilizers.  He would benefit from continuing skilled PT for left shoulder strengthening and scapular stabilization.    OBJECTIVE IMPAIRMENTS: decreased activity tolerance, decreased endurance, decreased ROM, decreased strength, increased muscle spasms, impaired flexibility, improper body mechanics, postural dysfunction, and pain.  GOALS: Goals reviewed with patient? Yes  SHORT TERM GOALS: Target date: 03/05/23  Pt to be I with HEP.  Baseline: Goal status: MET  2.  Pt to demonstrate improved posture for at least 30 mins to decrease pain and tension at shoulder.  Baseline:  Goal status: MET  3.  Pt will report 25% reduction of pain due to improvements in posture, strength, and muscle length  Baseline: 8/10 Goal status: MET   LONG TERM GOALS: Target date: 05/08/23  Pt to be I with advanced HEP.  Baseline:  Goal status: INITIAL  2.  Pt to demonstrate improved ROM at Lt shoulder to full range with abduction with no more than 2 strength grade increase in pain for improved mobility at work  Baseline:  Goal status: INITIAL  3.  Pt will report 50% reduction of pain due to improvements in posture, strength, and muscle length  Baseline:  Goal status: INITIAL  4.  Pt to demonstrate ability to lift at least 50# without increase of pain more than 2 strength grades to return to lifting batteries for ambulances.   Baseline:  Goal status: INITIAL  5.  Complete FOTO at second session.  Baseline:  Goal status: INITIAL    PLAN:  PT FREQUENCY: 2x/week  PT DURATION: 8 weeks  PLANNED INTERVENTIONS: Therapeutic exercises, Therapeutic  activity, Neuromuscular re-education, Patient/Family education, Self Care, Joint mobilization, Joint manipulation, DME instructions, Aquatic Therapy, Dry Needling, Electrical stimulation, Spinal mobilization, Cryotherapy, Moist heat, Taping, Vasopneumatic device, Traction, Ionotophoresis 4mg /ml Dexamethasone, and Manual therapy  PLAN FOR NEXT SESSION: Proceed with rotator cuff strengthening and scapular stabilization.   Ice at end of session.      Victorino Dike B. Liara Holm, PT 03/27/23 9:50 PM [facility] [address], [address] [place], [place] [address] Phone # [phone] Fax [phone]   PHYSICAL THERAPY DISCHARGE SUMMARY  Pt discharged from PT as he had surgical procedure on 04/23/2023  Patient agrees to discharge. Patient goals were partially met. Patient is being discharged due to  secondary to going for surgical procedure on 04/23/2023.  Clydie Braun Menke, PT, DPT 05/15/23, 11:24 AM

## 2023-04-01 ENCOUNTER — Ambulatory Visit: Payer: PRIVATE HEALTH INSURANCE | Admitting: Physical Therapy

## 2023-04-01 NOTE — Therapy (Deleted)
OUTPATIENT PHYSICAL THERAPY SHOULDER TREATMENT   Patient Name: Bruce Simmons MRN: [id number] DOB:[birth year], 47 y.o., male Today's Date: 04/01/2023  END OF SESSION:     Past Medical History:  Diagnosis Date   GERD (gastroesophageal reflux disease)    Seasonal allergies    Sleep apnea    Sleep-related bruxism    No past surgical history on file. Patient Active Problem List   Diagnosis Date Noted   OSA (obstructive sleep apnea) 11/11/2013    PCP: Erskine Speed, NP  REFERRING PROVIDER: Jones Broom, MD  REFERRING DIAG: Left shoulder pain  THERAPY DIAG:  No diagnosis found.  Rationale for Evaluation and Treatment: Rehabilitation  ONSET DATE: 8 weeks ago  SUBJECTIVE:                                                                                                                                                                                      SUBJECTIVE STATEMENT: *** Pt states that he continues to get pain with reaching out to the side.    Hand dominance: Right  PERTINENT HISTORY: Nothing male note   PAIN:  Are you having pain? Yes: NPRS scale: 0 at lowest and gets up to 8/10 Pain location: Lt anterior shoulder  Pain description: sharp lasting as long as he's in that motion  Aggravating factors: shoulder abduction, reaching over head, sometimes putting shirt. Relieving factors: rest, ice, ibuprofen   PRECAUTIONS: Other: no push/pull/lift over 10#  WEIGHT BEARING RESTRICTIONS: No  FALLS:  Has patient fallen in last 6 months? No  LIVING ENVIRONMENT: Lives with: lives with their family Lives in: House/apartment  OCCUPATION: EMT  PLOF: Independent  PATIENT GOALS:to have less pain  NEXT MD VISIT: 02/21/23  OBJECTIVE:   DIAGNOSTIC FINDINGS:  X-ray (-) MRI IMPRESSION 03/02/23: 1. Moderate tendinosis of the supraspinatus tendon with a small partial-thickness bursal surface tear along the anterior-most aspect and a small insertional  interstitial tears of the posterior most aspect. 2. Severe tendinosis of the infraspinatus tendon. 3. Mild tendinosis of the intra-articular portion of the long head of the biceps tendon.  PATIENT SURVEYS:  FOTO:    POSTURE: Forward head, rounded shoulders, increased kyphosis   UPPER EXTREMITY ROM:  All right WFL without pain LT: shoulder abduction limited by 75%, flexion limited by 25%, IR 25% limited, extension and ER WFL - all mobility with pain abduction most painful    UPPER EXTREMITY MMT: Rt all 5/5  MMT Right eval Left eval  Shoulder flexion  4/5 within range though limited due to pain   Shoulder extension    Shoulder abduction  Unable to hold resistance and very limited due to pain  Shoulder adduction  3+/5  Shoulder internal rotation  Unable to hold resistance and very limited due to pain  Shoulder external rotation  3+/5  Middle trapezius    Lower trapezius    Elbow flexion    Elbow extension    Wrist flexion    Wrist extension    Wrist ulnar deviation    Wrist radial deviation    Wrist pronation    Wrist supination    Grip strength (lbs)  5/5  (Blank rows = not tested)  SHOULDER SPECIAL TESTS: Impingement tests: Neer impingement test: negative, Hawkins/Kennedy impingement test: positive , and Painful arc test: positive  SLAP lesions: Crank test: negative, Biceps load test: negative, and Clunk test: negative Rotator cuff assessment: Empty can test: negative, Full can test: negative, and Infraspinatus test: negative AC compression (+) pain    PALPATION:  TTP across anterior shoulder, no TTP throughout neck or posterior lateral sides of shoulder joint   TODAY'S TREATMENT:                                                                                                                                         DATE: 04/01/23: ***  03/27/23: UBE x 6 min (3/3) 3 way scapular stabilization with blue loop x 10 (left) 4D ball rolls with light blue plyo ball x  20 each (left) (verbal cues for straight arm position with isometric press while doing smaller oscillations) Prone shoulder extension, rows and horizontal abd x 20 with  4 lbs left  Prone drop and catch with light blue plyo ball 2 x 20 Side lying ER x 20 with 4 lbs left,  then x 20 with 0# Supine serratus punch x 20 left with 4 lbs Supine shoulder alphabet A-Z with 4 lbs  Ice to left shoulder seated with bolsters under shoulder to elevate to 90 degrees x 10 min  03/25/23 Rt sidelying Lt ER #3 2x10 Supine "bear hugs" red TB x20 reps  Prone Lt shoulder extension, middle rows, horizontal abd x20 reps, #3 Prone Lt shoulder I and T x15 reps  Standing Lt shoulder ER to neutral with press forward, yellow TB 2x10 reps  Standing Lt shoulder IR yellow TB 2x10 Supine BUE flexion yellow TB around wrists for posterior shoulder activation through motion Supine Lt UE D2 extension x10 reps   03/20/23: UBE x 4 min fwd only Prone shoulder extension, rows and horizontal abd x 20 with  3 lbs left  Side lying ER x 20 with 3 lbs left Supine serratus punch x 20 left with 3 lbs Supine shoulder alphabet A-Z with 3 lbs 3 way scapular stabilization with green loop x 10 (left) 4D ball rolls with light blue plyo ball x 20 each (left) PROM: all planes along with shoulder mobs posterior and inferior and post capsule stretch  Ice to left shoulder seated with bolsters under shoulder to elevate to 90  degrees x 10 min   Examination completed, findings reviewed, pt educated on POC, HEP. Pt motivated to participate in PT and agreeable to attempt recommendations.    PATIENT EDUCATION: Education details: technique with therex; ice at work following session Person educated: Patient Education method: Programmer, multimedia, Facilities manager, Actor cues, Verbal cues, and Handouts Education comprehension: verbalized understanding, returned demonstration, verbal cues required, tactile cues required, and needs further education  HOME  EXERCISE PROGRAM: Access Code: [email or web address] URL: [email or web address] Date: 03/20/2023 Prepared by: Mikey Kirschner  Exercises - Isometric Shoulder Abduction at Wall  - 2 x daily - 7 x weekly - 5 reps - 10 second hold - Isometric Shoulder Flexion at Wall  - 2 x daily - 7 x weekly - 5 reps - 10 second hold - Standing Bilateral Low Shoulder Row with Anchored Resistance  - 1 x daily - 7 x weekly - 2 sets - 10 reps - 5 sec hold - Shoulder extension with resistance - Neutral  - 1 x daily - 7 x weekly - 2 sets - 10 reps - 5 sec hold - Prone Shoulder Extension - Single Arm  - 2 x daily - 7 x weekly - 2 sets - 10 reps - Prone Shoulder Row  - 2 x daily - 7 x weekly - 2 sets - 10 reps - Prone Single Arm Shoulder Horizontal Abduction with Scapular Retraction and Palm Down  - 2 x daily - 7 x weekly - 2 sets - 10 reps - Sidelying Shoulder External Rotation  - 2 x daily - 7 x weekly - 2 sets - 10 reps - Single Arm Serratus Punches in Supine with Dumbbell  - 2 x daily - 7 x weekly - 2 sets - 10 reps - Standing Shoulder Flexion to 90 Degrees with Dumbbells  - 2 x daily - 7 x weekly - 2 sets - 10 reps - Standing Shoulder Scaption  - 2 x daily - 7 x weekly - 2 sets - 10 reps - Sleeper Stretch  - 1 x daily - 7 x weekly - 1 sets - 10 reps - 5-10 sec hold - Sidelying Posterior Cuff Cross Body Stretch  - 1 x daily - 7 x weekly - 1 sets - 10 reps - 5-10 sec hold  Patient Education - Ionto Patient Instructions Patient Education - [place] Patient Instructions ASSESSMENT:  CLINICAL IMPRESSION: Toney Sang is able to complete scapular stabilization activities today with moderate fatigue but no pain with increased resistance.  He continues to have some pain at 90 degrees abduction and flexion but his symptoms are improved from last session and he continues to progress.  He is extremely weak in scapular stabilizers.  He would benefit from continuing skilled PT for left shoulder strengthening and scapular  stabilization.    OBJECTIVE IMPAIRMENTS: decreased activity tolerance, decreased endurance, decreased ROM, decreased strength, increased muscle spasms, impaired flexibility, improper body mechanics, postural dysfunction, and pain.    GOALS: Goals reviewed with patient? Yes  SHORT TERM GOALS: Target date: 03/05/23  Pt to be I with HEP.  Baseline: Goal status: MET  2.  Pt to demonstrate improved posture for at least 30 mins to decrease pain and tension at shoulder.  Baseline:  Goal status: MET  3.  Pt will report 25% reduction of pain due to improvements in posture, strength, and muscle length  Baseline: 8/10 Goal status: MET   LONG TERM GOALS: Target date: 05/08/23  Pt to be I with advanced HEP.  Baseline:  Goal  status: INITIAL  2.  Pt to demonstrate improved ROM at Lt shoulder to full range with abduction with no more than 2 strength grade increase in pain for improved mobility at work  Baseline:  Goal status: INITIAL  3.  Pt will report 50% reduction of pain due to improvements in posture, strength, and muscle length  Baseline:  Goal status: INITIAL  4.  Pt to demonstrate ability to lift at least 50# without increase of pain more than 2 strength grades to return to lifting batteries for ambulances.   Baseline:  Goal status: INITIAL  5.  Complete FOTO at second session.  Baseline:  Goal status: INITIAL    PLAN:  PT FREQUENCY: 2x/week  PT DURATION: 8 weeks  PLANNED INTERVENTIONS: Therapeutic exercises, Therapeutic activity, Neuromuscular re-education, Patient/Family education, Self Care, Joint mobilization, Joint manipulation, DME instructions, Aquatic Therapy, Dry Needling, Electrical stimulation, Spinal mobilization, Cryotherapy, Moist heat, Taping, Vasopneumatic device, Traction, Ionotophoresis 4mg /ml Dexamethasone, and Manual therapy  PLAN FOR NEXT SESSION: Proceed with rotator cuff strengthening and scapular stabilization.   Ice at end of session.      [facility]  [address] [place], PT 04/01/23 1:52 PM [facility] [address], [address] [place], [place] [address] Phone # [phone] Fax [phone]

## 2023-04-17 ENCOUNTER — Other Ambulatory Visit: Payer: Self-pay | Admitting: Orthopedic Surgery

## 2023-04-17 ENCOUNTER — Other Ambulatory Visit: Payer: Self-pay

## 2023-04-17 ENCOUNTER — Encounter (HOSPITAL_BASED_OUTPATIENT_CLINIC_OR_DEPARTMENT_OTHER): Payer: Self-pay | Admitting: Orthopedic Surgery

## 2023-04-18 ENCOUNTER — Other Ambulatory Visit: Payer: Self-pay

## 2023-04-18 ENCOUNTER — Ambulatory Visit (HOSPITAL_COMMUNITY): Admission: RE | Admit: 2023-04-18 | Payer: Commercial Managed Care - PPO | Source: Ambulatory Visit

## 2023-04-18 ENCOUNTER — Other Ambulatory Visit: Payer: Commercial Managed Care - PPO

## 2023-04-18 ENCOUNTER — Encounter (HOSPITAL_BASED_OUTPATIENT_CLINIC_OR_DEPARTMENT_OTHER)
Admission: RE | Admit: 2023-04-18 | Discharge: 2023-04-18 | Disposition: A | Discharge status: Home / Self Care | Payer: PRIVATE HEALTH INSURANCE | Source: Ambulatory Visit | Attending: Orthopedic Surgery | Admitting: Orthopedic Surgery

## 2023-04-18 DIAGNOSIS — Z01810 Encounter for preprocedural cardiovascular examination: Secondary | ICD-10-CM | POA: Diagnosis present

## 2023-04-18 NOTE — Progress Notes (Signed)

## 2023-04-22 NOTE — Progress Notes (Signed)
Pt called regarding his CXR that was ordered by the surgeons PA. Stated that he went to [facility] to complete the CXR but was turned down. He called his case Garment/textile technologist his workers comp claim but she unfortantely did not return his call until today. I myself called [facility] to find out why they couldn't do his CXR and the front office stated that it was most likely bc they did not have his workers comp claim number and information and would need that before proceeding. I called the pt back and advised him to reach out to his surgeon to try and resolve this issue. His surgery is scheduled for tomorrow here at [facility] with an arrival time of 1115. He could hopefully do this tomorrow before his arrival. I have also spoke directly with Darel Hong his scheduler.

## 2023-04-23 ENCOUNTER — Ambulatory Visit (HOSPITAL_BASED_OUTPATIENT_CLINIC_OR_DEPARTMENT_OTHER)
Admission: RE | Admit: 2023-04-23 | Discharge: 2023-04-23 | Disposition: A | Discharge status: Home / Self Care | Payer: PRIVATE HEALTH INSURANCE | Attending: Orthopedic Surgery | Admitting: Orthopedic Surgery

## 2023-04-23 ENCOUNTER — Encounter (HOSPITAL_BASED_OUTPATIENT_CLINIC_OR_DEPARTMENT_OTHER)
Admission: RE | Disposition: A | Discharge status: Home / Self Care | Payer: Self-pay | Source: Home / Self Care | Attending: Orthopedic Surgery

## 2023-04-23 ENCOUNTER — Other Ambulatory Visit: Payer: Self-pay

## 2023-04-23 ENCOUNTER — Ambulatory Visit (HOSPITAL_BASED_OUTPATIENT_CLINIC_OR_DEPARTMENT_OTHER): Payer: PRIVATE HEALTH INSURANCE | Admitting: Certified Registered"

## 2023-04-23 ENCOUNTER — Other Ambulatory Visit (HOSPITAL_COMMUNITY): Payer: Self-pay

## 2023-04-23 ENCOUNTER — Encounter (HOSPITAL_BASED_OUTPATIENT_CLINIC_OR_DEPARTMENT_OTHER): Payer: Self-pay | Admitting: Orthopedic Surgery

## 2023-04-23 DIAGNOSIS — G4733 Obstructive sleep apnea (adult) (pediatric): Secondary | ICD-10-CM

## 2023-04-23 DIAGNOSIS — F419 Anxiety disorder, unspecified: Secondary | ICD-10-CM | POA: Insufficient documentation

## 2023-04-23 DIAGNOSIS — M75112 Incomplete rotator cuff tear or rupture of left shoulder, not specified as traumatic: Secondary | ICD-10-CM

## 2023-04-23 DIAGNOSIS — Z87891 Personal history of nicotine dependence: Secondary | ICD-10-CM | POA: Diagnosis not present

## 2023-04-23 DIAGNOSIS — Z01818 Encounter for other preprocedural examination: Secondary | ICD-10-CM

## 2023-04-23 DIAGNOSIS — X58XXXA Exposure to other specified factors, initial encounter: Secondary | ICD-10-CM | POA: Insufficient documentation

## 2023-04-23 DIAGNOSIS — M75102 Unspecified rotator cuff tear or rupture of left shoulder, not specified as traumatic: Secondary | ICD-10-CM | POA: Diagnosis present

## 2023-04-23 DIAGNOSIS — M67814 Other specified disorders of tendon, left shoulder: Secondary | ICD-10-CM | POA: Diagnosis not present

## 2023-04-23 DIAGNOSIS — G4730 Sleep apnea, unspecified: Secondary | ICD-10-CM | POA: Insufficient documentation

## 2023-04-23 DIAGNOSIS — S43432A Superior glenoid labrum lesion of left shoulder, initial encounter: Secondary | ICD-10-CM | POA: Diagnosis not present

## 2023-04-23 DIAGNOSIS — M25812 Other specified joint disorders, left shoulder: Secondary | ICD-10-CM | POA: Diagnosis not present

## 2023-04-23 HISTORY — PX: SHOULDER ARTHROSCOPY WITH ROTATOR CUFF REPAIR AND SUBACROMIAL DECOMPRESSION: SHX5686

## 2023-04-23 HISTORY — DX: Anxiety disorder, unspecified: F41.9

## 2023-04-23 SURGERY — SHOULDER ARTHROSCOPY WITH ROTATOR CUFF REPAIR AND SUBACROMIAL DECOMPRESSION
Anesthesia: General | Site: Shoulder | Laterality: Left

## 2023-04-23 MED ORDER — ACETAMINOPHEN 500 MG PO TABS
1000.0000 mg | ORAL_TABLET | Freq: Once | ORAL | Status: AC
  Administered 2023-04-23: 1000 mg via ORAL

## 2023-04-23 MED ORDER — DIPHENHYDRAMINE HCL 50 MG/ML IJ SOLN
INTRAMUSCULAR | Status: AC
  Filled 2023-04-23: qty 1

## 2023-04-23 MED ORDER — DIPHENHYDRAMINE HCL 50 MG/ML IJ SOLN
INTRAMUSCULAR | Status: DC | PRN
  Administered 2023-04-23: 12.5 mg via INTRAVENOUS

## 2023-04-23 MED ORDER — FENTANYL CITRATE (PF) 100 MCG/2ML IJ SOLN
100.0000 ug | Freq: Once | INTRAMUSCULAR | Status: AC
  Administered 2023-04-23: 100 ug via INTRAVENOUS

## 2023-04-23 MED ORDER — ACETAMINOPHEN 500 MG PO TABS
ORAL_TABLET | ORAL | Status: AC
  Filled 2023-04-23: qty 2

## 2023-04-23 MED ORDER — LACTATED RINGERS IV SOLN: INTRAVENOUS | Status: DC

## 2023-04-23 MED ORDER — ONDANSETRON HCL 4 MG/2ML IJ SOLN
INTRAMUSCULAR | Status: AC
  Filled 2023-04-23: qty 2

## 2023-04-23 MED ORDER — BUPIVACAINE LIPOSOME 1.3 % IJ SUSP
INTRAMUSCULAR | Status: DC | PRN
  Administered 2023-04-23: 10 mL via PERINEURAL

## 2023-04-23 MED ORDER — FENTANYL CITRATE (PF) 100 MCG/2ML IJ SOLN
INTRAMUSCULAR | Status: AC
  Filled 2023-04-23: qty 2

## 2023-04-23 MED ORDER — SUGAMMADEX SODIUM 200 MG/2ML IV SOLN
INTRAVENOUS | Status: DC | PRN
  Administered 2023-04-23: 200 mg via INTRAVENOUS

## 2023-04-23 MED ORDER — EPHEDRINE SULFATE (PRESSORS) 50 MG/ML IJ SOLN
INTRAMUSCULAR | Status: DC | PRN
  Administered 2023-04-23: 5 mg via INTRAVENOUS

## 2023-04-23 MED ORDER — TIZANIDINE HCL 4 MG PO TABS
4.0000 mg | ORAL_TABLET | Freq: Three times a day (TID) | ORAL | 0 refills | Status: AC | PRN
  Filled 2023-04-23: qty 30, 10d supply, fill #0

## 2023-04-23 MED ORDER — GLYCOPYRROLATE 0.2 MG/ML IJ SOLN
INTRAMUSCULAR | Status: DC | PRN
  Administered 2023-04-23: .1 mg via INTRAVENOUS

## 2023-04-23 MED ORDER — LIDOCAINE HCL (CARDIAC) PF 100 MG/5ML IV SOSY
PREFILLED_SYRINGE | INTRAVENOUS | Status: DC | PRN
  Administered 2023-04-23: 60 mg via INTRAVENOUS

## 2023-04-23 MED ORDER — VANCOMYCIN HCL IN DEXTROSE 1-5 GM/200ML-% IV SOLN
INTRAVENOUS | Status: AC
  Filled 2023-04-23: qty 200

## 2023-04-23 MED ORDER — DEXAMETHASONE SODIUM PHOSPHATE 4 MG/ML IJ SOLN
INTRAMUSCULAR | Status: DC | PRN
  Administered 2023-04-23: 8 mg via INTRAVENOUS

## 2023-04-23 MED ORDER — VANCOMYCIN HCL 1500 MG/300ML IV SOLN: 1500.0000 mg | Freq: Once | INTRAVENOUS | Status: DC

## 2023-04-23 MED ORDER — OXYCODONE HCL 5 MG PO TABS: 5.0000 mg | ORAL_TABLET | Freq: Once | ORAL | Status: DC | PRN

## 2023-04-23 MED ORDER — VANCOMYCIN HCL IN DEXTROSE 1-5 GM/200ML-% IV SOLN: 1000.0000 mg | INTRAVENOUS | Status: DC

## 2023-04-23 MED ORDER — CEFAZOLIN SODIUM 1 G IJ SOLR
INTRAMUSCULAR | Status: AC
  Filled 2023-04-23: qty 30

## 2023-04-23 MED ORDER — LIDOCAINE 2% (20 MG/ML) 5 ML SYRINGE
INTRAMUSCULAR | Status: AC
  Filled 2023-04-23: qty 5

## 2023-04-23 MED ORDER — OXYCODONE HCL 5 MG/5ML PO SOLN: 5.0000 mg | Freq: Once | ORAL | Status: DC | PRN

## 2023-04-23 MED ORDER — MIDAZOLAM HCL 2 MG/2ML IJ SOLN
2.0000 mg | Freq: Once | INTRAMUSCULAR | Status: AC
  Administered 2023-04-23: 2 mg via INTRAVENOUS

## 2023-04-23 MED ORDER — BUPIVACAINE-EPINEPHRINE (PF) 0.5% -1:200000 IJ SOLN
INTRAMUSCULAR | Status: DC | PRN
  Administered 2023-04-23: 10 mL

## 2023-04-23 MED ORDER — EPHEDRINE 5 MG/ML INJ
INTRAVENOUS | Status: AC
  Filled 2023-04-23: qty 5

## 2023-04-23 MED ORDER — MIDAZOLAM HCL 2 MG/2ML IJ SOLN: 0.5000 mg | Freq: Once | INTRAMUSCULAR | Status: DC | PRN

## 2023-04-23 MED ORDER — MIDAZOLAM HCL 2 MG/2ML IJ SOLN
INTRAMUSCULAR | Status: AC
  Filled 2023-04-23: qty 2

## 2023-04-23 MED ORDER — MEPERIDINE HCL 25 MG/ML IJ SOLN: 6.2500 mg | INTRAMUSCULAR | Status: DC | PRN

## 2023-04-23 MED ORDER — HYDROMORPHONE HCL 1 MG/ML IJ SOLN
0.2500 mg | INTRAMUSCULAR | Status: DC | PRN
  Administered 2023-04-23: 0.5 mg via INTRAVENOUS

## 2023-04-23 MED ORDER — GLYCOPYRROLATE PF 0.2 MG/ML IJ SOSY
PREFILLED_SYRINGE | INTRAMUSCULAR | Status: AC
  Filled 2023-04-23: qty 1

## 2023-04-23 MED ORDER — DEXAMETHASONE SODIUM PHOSPHATE 10 MG/ML IJ SOLN
INTRAMUSCULAR | Status: AC
  Filled 2023-04-23: qty 1

## 2023-04-23 MED ORDER — VANCOMYCIN HCL 500 MG/100ML IV SOLN: 500.0000 mg | INTRAVENOUS | Status: DC

## 2023-04-23 MED ORDER — FENTANYL CITRATE (PF) 100 MCG/2ML IJ SOLN
INTRAMUSCULAR | Status: DC | PRN
  Administered 2023-04-23: 100 ug via INTRAVENOUS

## 2023-04-23 MED ORDER — DEXTROSE 5 % IV SOLN
INTRAVENOUS | Status: DC | PRN
  Administered 2023-04-23: 3 g via INTRAVENOUS

## 2023-04-23 MED ORDER — PROPOFOL 10 MG/ML IV BOLUS
INTRAVENOUS | Status: DC | PRN
  Administered 2023-04-23: 200 mg via INTRAVENOUS

## 2023-04-23 MED ORDER — ROCURONIUM BROMIDE 100 MG/10ML IV SOLN
INTRAVENOUS | Status: DC | PRN
  Administered 2023-04-23: 70 mg via INTRAVENOUS

## 2023-04-23 MED ORDER — PROPOFOL 10 MG/ML IV BOLUS
INTRAVENOUS | Status: AC
  Filled 2023-04-23: qty 20

## 2023-04-23 MED ORDER — HYDROMORPHONE HCL 1 MG/ML IJ SOLN
INTRAMUSCULAR | Status: AC
  Filled 2023-04-23: qty 0.5

## 2023-04-23 MED ORDER — PROMETHAZINE HCL 25 MG/ML IJ SOLN: 6.2500 mg | INTRAMUSCULAR | Status: DC | PRN

## 2023-04-23 MED ORDER — OXYCODONE HCL 5 MG PO TABS
5.0000 mg | ORAL_TABLET | ORAL | 0 refills | Status: DC | PRN
  Filled 2023-04-23: qty 30, 5d supply, fill #0

## 2023-04-23 MED ORDER — VANCOMYCIN HCL 500 MG/100ML IV SOLN
INTRAVENOUS | Status: AC
  Filled 2023-04-23: qty 100

## 2023-04-23 SURGICAL SUPPLY — 60 items
AID PSTN UNV HD RSTRNT DISP (MISCELLANEOUS) ×1
APL PRP STRL LF DISP 70% ISPRP (MISCELLANEOUS) ×1
APL SKNCLS STERI-STRIP NONHPOA (GAUZE/BANDAGES/DRESSINGS)
BENZOIN TINCTURE PRP APPL 2/3 (GAUZE/BANDAGES/DRESSINGS) IMPLANT
BURR OVAL 8 FLU 4.0X13 (MISCELLANEOUS) ×1 IMPLANT
CANNULA 5.75X7 CRYSTAL CLEAR (CANNULA) ×1 IMPLANT
CANNULA TWIST IN 8.25X7CM (CANNULA) IMPLANT
CHLORAPREP W/TINT 26 (MISCELLANEOUS) ×1 IMPLANT
CUTTER BONE 4.0MM X 13CM (MISCELLANEOUS) ×1 IMPLANT
DRAPE IMP U-DRAPE 54X76 (DRAPES) ×1 IMPLANT
DRAPE INCISE IOBAN 66X45 STRL (DRAPES) ×1 IMPLANT
DRAPE POUCH INSTRU U-SHP 10X18 (DRAPES) ×1 IMPLANT
DRAPE STERI 35X30 U-POUCH (DRAPES) ×1 IMPLANT
DRAPE SURG 17X23 STRL (DRAPES) ×1 IMPLANT
DRAPE U-SHAPE 47X51 STRL (DRAPES) ×1 IMPLANT
DRAPE U-SHAPE 76X120 STRL (DRAPES) ×2 IMPLANT
ELECT REM PT RETURN 9FT ADLT (ELECTROSURGICAL) ×1
ELECTRODE REM PT RTRN 9FT ADLT (ELECTROSURGICAL) ×1 IMPLANT
GAUZE 4X4 16PLY ~~LOC~~+RFID DBL (SPONGE) IMPLANT
GAUZE PAD ABD 8X10 STRL (GAUZE/BANDAGES/DRESSINGS) ×1 IMPLANT
GAUZE SPONGE 4X4 12PLY STRL (GAUZE/BANDAGES/DRESSINGS) ×1 IMPLANT
GAUZE XEROFORM 1X8 LF (GAUZE/BANDAGES/DRESSINGS) ×1 IMPLANT
GLOVE BIO SURGEON STRL SZ7.5 (GLOVE) ×1 IMPLANT
GLOVE BIOGEL PI IND STRL 6.5 (GLOVE) ×1 IMPLANT
GLOVE BIOGEL PI IND STRL 8 (GLOVE) ×1 IMPLANT
GLOVE SURG SS PI 6.5 STRL IVOR (GLOVE) ×1 IMPLANT
GOWN STRL REUS W/ TWL LRG LVL3 (GOWN DISPOSABLE) ×2 IMPLANT
GOWN STRL REUS W/ TWL XL LVL3 (GOWN DISPOSABLE) ×1 IMPLANT
GOWN STRL REUS W/TWL LRG LVL3 (GOWN DISPOSABLE) ×2
GOWN STRL REUS W/TWL XL LVL3 (GOWN DISPOSABLE) ×1
LASSO CRESCENT QUICKPASS (SUTURE) IMPLANT
MANIFOLD NEPTUNE II (INSTRUMENTS) ×1 IMPLANT
NDL HD SCORPION MEGA LOADER (NEEDLE) IMPLANT
NS IRRIG 1000ML POUR BTL (IV SOLUTION) IMPLANT
PACK ARTHROSCOPY DSU (CUSTOM PROCEDURE TRAY) ×1 IMPLANT
PACK BASIN DAY SURGERY FS (CUSTOM PROCEDURE TRAY) ×1 IMPLANT
PORT APPOLLO RF 90DEGREE MULTI (SURGICAL WAND) IMPLANT
PROBE BIPOLAR ATHRO 135MM 90D (MISCELLANEOUS) ×1 IMPLANT
RESTRAINT HEAD UNIVERSAL NS (MISCELLANEOUS) ×1 IMPLANT
SLEEVE SCD COMPRESS KNEE MED (STOCKING) ×1 IMPLANT
SLING ARM FOAM STRAP LRG (SOFTGOODS) IMPLANT
SPIKE FLUID TRANSFER (MISCELLANEOUS) IMPLANT
STRIP CLOSURE SKIN 1/2X4 (GAUZE/BANDAGES/DRESSINGS) IMPLANT
SUPPORT WRAP ARM LG (MISCELLANEOUS) ×1 IMPLANT
SUT ETHILON 3 0 PS 1 (SUTURE) ×1 IMPLANT
SUT FIBERWIRE #2 38 T-5 BLUE (SUTURE)
SUT PDS AB 0 CT 36 (SUTURE) IMPLANT
SUT TIGER TAPE 7 IN WHITE (SUTURE) IMPLANT
SUT VIC AB 0 CT1 27 (SUTURE) ×1
SUT VIC AB 0 CT1 27XBRD ANBCTR (SUTURE) IMPLANT
SUT VIC AB 2-0 SH 27 (SUTURE) ×1
SUT VIC AB 2-0 SH 27XBRD (SUTURE) IMPLANT
SUTURE FIBERWR #2 38 T-5 BLUE (SUTURE) IMPLANT
SUTURE TAPE 1.3 40 TPR END (SUTURE) IMPLANT
SUTURETAPE 1.3 40 TPR END (SUTURE) ×2
TAPE FIBER 2MM 7IN #2 BLUE (SUTURE) IMPLANT
TOWEL GREEN STERILE FF (TOWEL DISPOSABLE) ×1 IMPLANT
TUBE CONNECTING 20X1/4 (TUBING) ×1 IMPLANT
TUBING ARTHROSCOPY IRRIG 16FT (MISCELLANEOUS) ×1 IMPLANT
WATER STERILE IRR 1000ML POUR (IV SOLUTION) ×1 IMPLANT

## 2023-04-23 NOTE — Discharge Instructions (Addendum)
Discharge Instructions after Open Shoulder Repair Next tylenol dose 5pm A sling has been provided for you. Remain in your sling at all times. This includes sleeping in your sling.  Use ice on the shoulder intermittently over the first 48 hours after surgery.  Pain medicine has been prescribed for you.  Use your medicine liberally over the first 48 hours, and then you can begin to taper your use. You may take Extra Strength Tylenol or Tylenol only in place of the pain pills. DO NOT take ANY nonsteroidal anti-inflammatory pain medications: Advil, Motrin, Ibuprofen, Aleve, Naproxen or Naprosyn.  You may remove your dressing after two days  You may shower 5 days after surgery. The incisions CANNOT get wet prior to 5 days. Simply allow the water to wash over the site and then pat dry. Do not rub the incisions. Make sure your axilla (armpit) is completely dry after showering.  Take one aspirin 81mg , a day for 2 weeks after surgery, unless you have an aspirin sensitivity/ allergy or asthma.   Please call [phone] during normal business hours or [phone] after hours for any problems. Including the following:  - excessive redness of the incisions   Regional Anesthesia Blocks  1. You may not be able to move or feel the "blocked" extremity after a regional anesthetic block. This may last may last from 3-48 hours after placement, but it will go away. The length of time depends on the medication injected and your individual response to the medication. As the nerves start to wake up, you may experience tingling as the movement and feeling returns to your extremity. If the numbness and inability to move your extremity has not gone away after 48 hours, please call your surgeon.   2. The extremity that is blocked will need to be protected until the numbness is gone and the strength has returned. Because you cannot feel it, you will need to take extra care to avoid injury. Because it may be weak, you may  have difficulty moving it or using it. You may not know what position it is in without looking at it while the block is in effect.  3. For blocks in the legs and feet, returning to weight bearing and walking needs to be done carefully. You will need to wait until the numbness is entirely gone and the strength has returned. You should be able to move your leg and foot normally before you try and bear weight or walk. You will need someone to be with you when you first try to ensure you do not fall and possibly risk injury.  4. Bruising and tenderness at the needle site are common side effects and will resolve in a few days.  5. Persistent numbness or new problems with movement should be communicated to the surgeon or the [facility] [phone] [facility] [phone]).Regional Anesthesia Blocks  1. You may not be able to move or feel the "blocked" extremity after a regional anesthetic block. This may last may last from 3-48 hours after placement, but it will go away. The length of time depends on the medication injected and your individual response to the medication. As the nerves start to wake up, you may experience tingling as the movement and feeling returns to your extremity. If the numbness and inability to move your extremity has not gone away after 48 hours, please call your surgeon.   2. The extremity that is blocked will need to be protected until the  numbness is gone and the strength has returned. Because you cannot feel it, you will need to take extra care to avoid injury. Because it may be weak, you may have difficulty moving it or using it. You may not know what position it is in without looking at it while the block is in effect.  3. For blocks in the legs and feet, returning to weight bearing and walking needs to be done carefully. You will need to wait until the numbness is entirely gone and the strength has returned. You should be able to move your leg and  foot normally before you try and bear weight or walk. You will need someone to be with you when you first try to ensure you do not fall and possibly risk injury.  4. Bruising and tenderness at the needle site are common side effects and will resolve in a few days.  5. Persistent numbness or new problems with movement should be communicated to the surgeon or the [facility] [phone] [facility] [phone]). - drainage for more than 4 days - fever of more than 101.5 F  *Please note that pain medications will not be refilled after hours or on weekends.   Information for Discharge Teaching: EXPAREL (bupivacaine liposome injectable suspension)   Pain relief is important to your recovery. The goal is to control your pain so you can move easier and return to your normal activities as soon as possible after your procedure. Your physician may use several types of medicines to manage pain, swelling, and more.  Your surgeon or anesthesiologist gave you EXPAREL(bupivacaine) to help control your pain after surgery.  EXPAREL is a local anesthetic designed to release slowly over an extended period of time to provide pain relief by numbing the tissue around the surgical site. EXPAREL is designed to release pain medication over time and can control pain for up to 72 hours. Depending on how you respond to EXPAREL, you may require less pain medication during your recovery. EXPAREL can help reduce or eliminate the need for opioids during the first few days after surgery when pain relief is needed the most. EXPAREL is not an opioid and is not addictive. It does not cause sleepiness or sedation.   Important! A teal colored band has been placed on your arm with the date, time and amount of EXPAREL you have received. Please leave this armband in place for the full 96 hours following administration, and then you may remove the band. If you return to the hospital for any reason within  96 hours following the administration of EXPAREL, the armband provides important information that your health care providers to know, and alerts them that you have received this anesthetic.    Possible side effects of EXPAREL: Temporary loss of sensation or ability to move in the area where medication was injected. Nausea, vomiting, constipation Rarely, numbness and tingling in your mouth or lips, lightheadedness, or anxiety may occur. Call your doctor right away if you think you may be experiencing any of these sensations, or if you have other questions regarding possible side effects.  Follow all other discharge instructions given to you by your surgeon or nurse. Eat a healthy diet and drink plenty of water or other fluids.

## 2023-04-23 NOTE — H&P (Signed)
Bruce Simmons is an 47 y.o. male.   Chief Complaint: L shoulder injury  HPI: s/p injury at work with ongoing pain related to partial RCT, impingement and long head biceps tendinopathy.  Failed conservative tx.  Past Medical History:  Diagnosis Date   Anxiety    GERD (gastroesophageal reflux disease)    Seasonal allergies    Sleep apnea    Sleep-related bruxism     History reviewed. No pertinent surgical history.  Family History  Problem Relation Age of Onset   Diabetes Father    Hypertension Father    Hyperlipidemia Father    Atrial fibrillation Father    Stroke Mother    Hypertension Mother    Thyroid cancer Mother    Social History:  reports that he quit smoking about 19 years ago. His smoking use included cigarettes. He started smoking about 22 years ago. He has a 0.8 pack-year smoking history. He does not have any smokeless tobacco history on file. He reports current alcohol use. He reports that he does not use drugs.  Allergies:  Allergies  Allergen Reactions   Penicillins Anaphylaxis   Zofran [Ondansetron Hcl]     Swelling of lips and tongue and rash    Medications Prior to Admission  Medication Sig Dispense Refill   escitalopram (LEXAPRO) 5 MG tablet Take 1 tablet (5 mg total) by mouth daily. 90 tablet 4   polyethylene glycol-electrolytes (NULYTELY) 420 g solution Use as directed 4000 mL 0   COVID-19 At Home Antigen Test (CARESTART COVID-19 HOME TEST) KIT Use as directed 4 each 0   escitalopram (LEXAPRO) 5 MG tablet Take 1 tablet (5 mg total) by mouth daily for 7 days, THEN 2 tablets (10 mg total) daily. 60 tablet 4    No results found for this or any previous visit (from the past 48 hour(s)). No results found.  Review of Systems  All other systems reviewed and are negative.   Blood pressure 121/78, pulse 74, temperature 98.4 F (36.9 C), temperature source Oral, resp. rate 20, height 5\' 6"  (1.676 m), weight 121.2 kg, SpO2 98%. Physical  Exam Constitutional:      Appearance: He is well-developed.  HENT:     Head: Atraumatic.  Eyes:     Extraocular Movements: Extraocular movements intact.  Cardiovascular:     Pulses: Normal pulses.  Pulmonary:     Effort: Pulmonary effort is normal.  Musculoskeletal:     Comments: L shoulder pain with impingement and biceps testing.  Skin:    General: Skin is warm and dry.  Neurological:     Mental Status: He is alert and oriented to person, place, and time.  Psychiatric:        Mood and Affect: Mood normal.      Assessment/Plan s/p injury at work with ongoing pain related to partial RCT, impingement and long head biceps tendinopathy.  Failed conservative tx. Plan L arth debridement, SAD, biceps tenodesis. Risks / benefits of surgery discussed Consent on chart  NPO for OR Preop antibiotics   Glennon Hamilton, MD 04/23/2023, 12:28 PM

## 2023-04-23 NOTE — Op Note (Signed)
Procedure(s):   Bruce Simmons male 47 y.o. 04/23/2023  Preoperative diagnosis: #1 left shoulder partial-thickness rotator cuff tear #2 left shoulder impingement with unfavorable acromial anatomy #3 left proximal long head biceps tendinopathy  Postoperative diagnosis: #1 left shoulder partial-thickness rotator cuff tear #2 left shoulder impingement with unfavorable acromial anatomy #3 left proximal long head biceps tendinopathy #4 left shoulder SLAP tear  Procedure performed: #1 left shoulder arthroscopic debridement partial rotator cuff tear, SLAP tear and subacromial bursa #2 left shoulder arthroscopic subacromial decompression #3 left shoulder open subpectoral biceps tenodesis  Surgeons and Role:    Jones Broom, MD - Primary     Surgeon: Glennon Hamilton   Assistants: Fredia Sorrow PA-C Amber was present and scrubbed throughout the procedure and was essential in positioning, retraction, exposure, and closure)  Anesthesia: General endotracheal anesthesia with preoperative interscalene block given by the attending anesthesiologist    Procedure Detail  Estimated Blood Loss: Min         Drains: none  Blood Given: none         Specimens: none        Complications:  (1) Difficult to intubate - expected  Comments: Filed from anesthesia note documentation.         Disposition: PACU - hemodynamically stable.         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  He was injured at work.  He has had ongoing left shoulder pain which has failed conservative management.  Indicated for surgical treatment to try and decrease pain and restore function.  OPERATIVE FINDINGS: Examination under anesthesia: No stiffness or instability   DESCRIPTION OF PROCEDURE: The patient was identified in preoperative  holding area where I personally marked the operative site after  verifying site, side, and procedure with  the patient. An interscalene block was given by the attending anesthesiologist the holding area.  The patient was taken back to the operating room where general anesthesia was induced without complication and was placed in the beach-chair position with the back  elevated about 60 degrees and all extremities and head and neck carefully padded and  positioned.   The left upper extremity was then prepped and  draped in a standard sterile fashion. The appropriate time-out  procedure was carried out. The patient did receive IV antibiotics  within 30 minutes of incision.   A small posterior portal incision was made and the arthroscope was introduced into the joint. An anterior portal was then established above the subscapularis using needle localization. Small cannula was placed anteriorly. Diagnostic arthroscopy was then carried out.  The subscapularis was noted to be completely intact.  The superior labrum was noted to have elevation and partial tearing extending partially into the biceps origin.  The biceps tendon was pulled into the joint and noted to have significant tenosynovitis.  This was felt to represent a significant cause of the patient's pain and therefore the ArthroCare was used to release the biceps from the superior labrum.  The superior labrum was debrided back to healthy stable labrum.  The joint surfaces were intact without chondromalacia.  The undersurface of the supraspinatus and infraspinatus were examined and noted to be intact.  The arthroscope was then introduced into the subacromial space a standard lateral portal was established with needle localization. The shaver was used through the lateral portal to perform extensive bursectomy.   The bursal surface of the rotator  cuff was carefully examined.  There was some mild fraying which was debrided back to healthy tendon but no exposed tuberosity or significant tearing.  The coracoacromial ligament was taken down off the anterior  acromion with the ArthroCare exposing a moderate anterior acromial spur. A high-speed bur was then used through the lateral portal to take down the anterior acromial spur from lateral to medial in a standard acromioplasty.  The acromioplasty was also viewed from the lateral portal and the bur was used as necessary to ensure that the acromion was completely flat from posterior to anterior.   Attention was then turned to the axilla where a approximately 3 cm incision was made in the dominant axillary fold. This was about 50% above and 50% below the palpable lower border of the pectoralis major. Dissection was carried out between the lower border of the pectoralis major and the short head of the biceps muscle belly. The anterior humerus was then exposed and the long head biceps was delivered out through the wound. The biceps was prepared using a #2 FiberWire fiber loop and the remaining portion of the biceps tendon was discarded after choosing the appropriate tension and length. A 5.5 reamer  was used in the distal bicipital groove to create an intramedullary hole and then a drill bit about 12 mm distal to that was used which was slightly larger than the suture passer needle. A crescent suture lasso was then used to pass the sutures from proximal to distal and then one suture was brought around medial and lateral to the tendon. It was tensioned, dunking the tendon into the intramedullary canal and tied over the anterior portion of the tendon. The tension was felt to be appropriate. The wound was copiously irrigated with normal saline and subsequently closed in layers with 2-0 Vicryl in the deep dermal layer and Dermabond for skin closure.   The portals were closed with 3-0 nylon in an interrupted fashion. Sterile dressings were then applied including Xeroform 4 x 4's ABDs and tape. The patient was then allowed to awaken from general anesthesia, placed in a sling, transferred to the stretcher and taken to the  recovery room in stable condition.   POSTOPERATIVE PLAN: The patient will be discharged home today and will followup in one week for suture removal and wound check.

## 2023-04-23 NOTE — Anesthesia Procedure Notes (Addendum)
Procedure Name: Intubation Date/Time: 04/23/2023 1:27 PM  Performed by: Earmon Phoenix, CRNAPre-anesthesia Checklist: Patient identified, Emergency Drugs available, Suction available, Patient being monitored and Timeout performed Patient Re-evaluated:Patient Re-evaluated prior to induction Preoxygenation: Pre-oxygenation with 100% oxygen Induction Type: IV induction Ventilation: Oral airway inserted - appropriate to patient size and Two handed mask ventilation required Laryngoscope Size: Mac, 4 and Glidescope Tube size: 7.5 mm Number of attempts: 2 Placement Confirmation: ETT inserted through vocal cords under direct vision, positive ETCO2 and breath sounds checked- equal and bilateral Secured at: 23 cm Tube secured with: Tape Dental Injury: Teeth and Oropharynx as per pre-operative assessment  Difficulty Due To: Difficulty was anticipated and Difficult Airway- due to large tongue Comments: Unable to visualize cords with MAC 4;effective ventilation with oral airway;cords viewed well with glide scope

## 2023-04-23 NOTE — Anesthesia Postprocedure Evaluation (Signed)
Anesthesia Post Note  Patient: Bruce Simmons  Procedure(s) Performed: SHOULDER ARTHROSCOPY WITH ROTATOR CUFF DEBRIDEMENT VERSUS REPAIR  AND SUBACROMIAL DECOMPRESSION, OPEN BICEPS TENODESIS (Left: Shoulder)     Patient location during evaluation: PACU Anesthesia Type: General Level of consciousness: awake and alert, patient cooperative and oriented Pain management: pain level controlled Vital Signs Assessment: post-procedure vital signs reviewed and stable Respiratory status: spontaneous breathing, nonlabored ventilation and respiratory function stable Cardiovascular status: blood pressure returned to baseline and stable Postop Assessment: no apparent nausea or vomiting, able to ambulate and adequate PO intake Anesthetic complications: yes   Encounter Notable Events  Notable Event Outcome Phase Comment  Difficult to intubate - expected  Intraprocedure Filed from anesthesia note documentation.    Last Vitals:  Vitals:   04/23/23 1515 04/23/23 1527  BP: (!) 130/91 132/82  Pulse: 75 78  Resp: 12 18  Temp:  (!) 36.3 C  SpO2: 97% 98%    Last Pain:  Vitals:   04/23/23 1527  TempSrc:   PainSc: 0-No pain                 Breccan Galant,E. Breonia Kirstein

## 2023-04-23 NOTE — Anesthesia Procedure Notes (Signed)
Anesthesia Regional Block: Interscalene brachial plexus block   Pre-Anesthetic Checklist: , timeout performed,  Correct Patient, Correct Site, Correct Laterality,  Correct Procedure, Correct Position, site marked,  Risks and benefits discussed,  Surgical consent,  Pre-op evaluation,  At surgeon's request and post-op pain management  Laterality: Left and Upper  Prep: chloraprep       Needles:  Injection technique: Single-shot  Needle Type: Echogenic Needle     Needle Length: 9cm  Needle Gauge: 21     Additional Needles:   Procedures:,,,, ultrasound used (permanent image in chart),,    Narrative:  Start time: 04/23/2023 12:51 PM End time: 04/23/2023 12:57 PM Injection made incrementally with aspirations every 5 mL.  Performed by: Personally  Anesthesiologist: Jairo Ben, MD  Additional Notes: Pt identified in Holding room.  Monitors applied. Working IV access confirmed. Timeout, Sterile prep L clavicle and neck.  #21ga ECHOgenic Arrow block needle to interscalene brachial plexus with US guidance.  10cc 0.5% Bupivacaine 1:200k epi, 10cc Exparel  injected incrementally after negative test dose.  Patient asymptomatic, VSS, no heme aspirated, tolerated well.   Sandford Craze, MD

## 2023-04-23 NOTE — Progress Notes (Signed)
Assisted Dr. Carswell Jackson with left, interscalene , ultrasound guided block. Side rails up, monitors on throughout procedure. See vital signs in flow sheet. Tolerated Procedure well. 

## 2023-04-23 NOTE — Anesthesia Preprocedure Evaluation (Addendum)
Anesthesia Evaluation  Patient identified by MRN, date of birth, ID band Patient awake    Reviewed: Allergy & Precautions, NPO status , Patient's Chart, lab work & pertinent test results  History of Anesthesia Complications Negative for: history of anesthetic complications  Airway Mallampati: II  TM Distance: >3 FB Neck ROM: Full    Dental  (+) Teeth Intact, Dental Advisory Given   Pulmonary sleep apnea and Continuous Positive Airway Pressure Ventilation , former smoker   breath sounds clear to auscultation       Cardiovascular negative cardio ROS  Rhythm:Regular Rate:Normal     Neuro/Psych   Anxiety     negative neurological ROS     GI/Hepatic Neg liver ROS,GERD  Controlled,,  Endo/Other  BMI 40  Renal/GU negative Renal ROS     Musculoskeletal   Abdominal   Peds  Hematology negative hematology ROS (+)   Anesthesia Other Findings   Reproductive/Obstetrics                             Anesthesia Physical Anesthesia Plan  ASA: 3  Anesthesia Plan: General   Post-op Pain Management: Tylenol PO (pre-op)* and Regional block*   Induction: Intravenous  PONV Risk Score and Plan: 2 and Ondansetron and Dexamethasone  Airway Management Planned: Oral ETT  Additional Equipment: None  Intra-op Plan:   Post-operative Plan: Extubation in OR  Informed Consent: I have reviewed the patients History and Physical, chart, labs and discussed the procedure including the risks, benefits and alternatives for the proposed anesthesia with the patient or authorized representative who has indicated his/her understanding and acceptance.     Dental advisory given  Plan Discussed with: CRNA and Surgeon  Anesthesia Plan Comments: (Plan routine monitors, GA with interscalene block for post op analgesia)        Anesthesia Quick Evaluation

## 2023-04-23 NOTE — Transfer of Care (Signed)
Immediate Anesthesia Transfer of Care Note  Patient: Bruce Simmons  Procedure(s) Performed: SHOULDER ARTHROSCOPY WITH ROTATOR CUFF DEBRIDEMENT VERSUS REPAIR  AND SUBACROMIAL DECOMPRESSION, OPEN BICEPS TENODESIS (Left: Shoulder)  Patient Location: PACU  Anesthesia Type:General  Level of Consciousness: awake, alert , oriented, and patient cooperative  Airway & Oxygen Therapy: Patient Spontanous Breathing and Patient connected to face mask oxygen  Post-op Assessment: Report given to RN and Post -op Vital signs reviewed and stable  Post vital signs: Reviewed and stable  Last Vitals:  Vitals Value Taken Time  BP 131/84 04/23/23 1445  Temp 36.6 C 04/23/23 1444  Pulse 82 04/23/23 1450  Resp 18 04/23/23 1450  SpO2 100 % 04/23/23 1450  Vitals shown include unfiled device data.  Last Pain:  Vitals:   04/23/23 1444  TempSrc:   PainSc: 7          Complications:  Encounter Notable Events  Notable Event Outcome Phase Comment  Difficult to intubate - expected  Intraprocedure Filed from anesthesia note documentation.

## 2023-04-24 ENCOUNTER — Encounter (HOSPITAL_BASED_OUTPATIENT_CLINIC_OR_DEPARTMENT_OTHER): Payer: Self-pay | Admitting: Orthopedic Surgery

## 2023-05-15 ENCOUNTER — Ambulatory Visit
Payer: PRIVATE HEALTH INSURANCE | Attending: Orthopedic Surgery | Admitting: Rehabilitative and Restorative Service Providers"

## 2023-05-15 ENCOUNTER — Encounter: Payer: Self-pay | Admitting: Rehabilitative and Restorative Service Providers"

## 2023-05-15 ENCOUNTER — Other Ambulatory Visit: Payer: Self-pay

## 2023-05-15 DIAGNOSIS — R293 Abnormal posture: Secondary | ICD-10-CM

## 2023-05-15 DIAGNOSIS — M6281 Muscle weakness (generalized): Secondary | ICD-10-CM

## 2023-05-15 DIAGNOSIS — R252 Cramp and spasm: Secondary | ICD-10-CM

## 2023-05-15 DIAGNOSIS — M25512 Pain in left shoulder: Secondary | ICD-10-CM | POA: Diagnosis present

## 2023-05-15 NOTE — Therapy (Signed)
OUTPATIENT PHYSICAL THERAPY SHOULDER EVALUATION   Patient Name: Bruce Simmons MRN: [id number] DOB:07/13/1976, 47 y.o., male Today's Date: 05/15/2023  END OF SESSION:  PT End of Session - 05/15/23 0806     Visit Number 1    Date for PT Re-Evaluation 07/05/23    Authorization Type cone aetna    Authorization - Visit Number 1    Authorization - Number of Visits 12    PT Start Time 0801    PT Stop Time 0840    PT Time Calculation (min) 39 min    Activity Tolerance Patient tolerated treatment well    Behavior During Therapy WFL for tasks assessed/performed             Past Medical History:  Diagnosis Date   Anxiety    GERD (gastroesophageal reflux disease)    Seasonal allergies    Sleep apnea    Sleep-related bruxism    Past Surgical History:  Procedure Laterality Date   SHOULDER ARTHROSCOPY WITH ROTATOR CUFF REPAIR AND SUBACROMIAL DECOMPRESSION Left 04/23/2023   Procedure: SHOULDER ARTHROSCOPY WITH ROTATOR CUFF DEBRIDEMENT VERSUS REPAIR  AND SUBACROMIAL DECOMPRESSION, OPEN BICEPS TENODESIS;  Surgeon: Jones Broom, MD;  Location:  [facility];  Service: Orthopedics;  Laterality: Left;   Patient Active Problem List   Diagnosis Date Noted   OSA (obstructive sleep apnea) 11/11/2013    PCP: Rebecka Apley, NP  REFERRING PROVIDER: Jones Broom, MD  REFERRING DIAG: SHOULDER LONG HEAD BICEPS TENODESIS  THERAPY DIAG:  Acute pain of left shoulder  Muscle weakness (generalized)  Abnormal posture  Cramp and spasm  Rationale for Evaluation and Treatment: Rehabilitation  ONSET DATE: 04/23/2023 surgery date, actual injury occurred in April 2024  SUBJECTIVE:                                                                                                                                                                                      SUBJECTIVE STATEMENT: Pt initially injured his left shoulder at work in April 2024.  After failed  conservative treatment, he had to undergo left shoulder arthroscopic debridement for partial rotator cuff tear, SLAP tear and subacromial bursa with left shoulder decompression and left shoulder subpectoral biceps tenodesis by Dr Ave Filter on 04/23/2023.  Hand dominance: Right  PERTINENT HISTORY: Left shoulder arthroscopic debridement for partial rotator cuff tear, SLAP tear and subacromial bursa with left shoulder decompression and left shoulder subpectoral biceps tenodesis by Dr Ave Filter on 04/23/2023 GERD, sleep apnea with CPAP  PAIN:  Are you having pain? Yes: NPRS scale: in general 0-2/10 Pain location: left shoulder Pain description: sore, but if he moves wrong can be a quick/sharp 10/10  pain Aggravating factors: certain movements Relieving factors: rest  PRECAUTIONS: Other: Per MD:  okay for full A/ROM and P/ROM of shoulder, no resisted elbow flexion or supination  RED FLAGS: None   WEIGHT BEARING RESTRICTIONS: No  FALLS:  Has patient fallen in last 6 months? No   OCCUPATION: Works for [employer].  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  NEXT MD VISIT: Dr Ave Filter in October  OBJECTIVE:   DIAGNOSTIC FINDINGS:  Left shoulder MRI on 03/02/2023 (prior to surgery): IMPRESSION: 1. Moderate tendinosis of the supraspinatus tendon with a small partial-thickness bursal surface tear along the anterior-most aspect and a small insertional interstitial tears of the posterior most aspect. 2. Severe tendinosis of the infraspinatus tendon. 3. Mild  tendinosis of the intra-articular portion of the long head of the biceps tendon.  PATIENT SURVEYS:  FOTO 46 (projected 64 by visit 19)  COGNITION: Overall cognitive status: Within functional limits for tasks assessed     SENSATION: Denies numbness or tingling  POSTURE: Rounded shoulders, forward head  UPPER EXTREMITY ROM:   Active ROM Right eval Left eval  Shoulder flexion 170 63  Shoulder extension 50 35  Shoulder abduction 165 56  Shoulder adduction    Shoulder internal rotation    Shoulder external rotation    (Blank rows = not tested)  UPPER EXTREMITY MMT:  Eval:   Right shoulder is WFL Left shoulder is at least 3- to 3/5    TODAY'S TREATMENT:                                                                                                                                         DATE: 05/15/2023 Reviewed HEP   PATIENT EDUCATION: Education details: Issued HEP Person educated: Patient Education method: Explanation, Facilities manager, and Handouts Education comprehension: verbalized understanding and returned demonstration  HOME EXERCISE PROGRAM: Access Code: [email or web address] URL: [email or web address] Date: 05/15/2023 Prepared by: Clydie Braun Carisha Kantor  Exercises - Seated Scapular Retraction  - 1 x daily - 7 x weekly - 2 sets - 10 reps - Circular Shoulder Pendulum with Table Support  - 1 x daily - 7 x weekly - 2 sets - 10 reps - Seated Shoulder Abduction Towel Slide at Table Top  - 1 x daily - 7 x weekly - 2 sets - 10 reps - Seated Shoulder Flexion Towel Slide at Table Top  - 1 x daily - 7 x weekly - 2 sets - 10 reps - Shoulder Flexion  Wall Slide with Towel  - 1 x daily - 7 x weekly - 2 sets - 10 reps - Standing Shoulder Abduction Slides at Wall  - 1 x daily - 7 x weekly - 2 sets - 10 reps  ASSESSMENT:  CLINICAL IMPRESSION: Patient is a 47 y.o. male who was seen today for physical therapy evaluation and treatment for s/p L shoulder arthroscopic surgical repair  of biceps tenodesis. Patient with MD orders to progress full A/ROM and P/ROM of shoulder with no resisted elbow flexion or supination.  Patient will follow up with Dr Ave Filter in October.  Pt works with [employer] and initially injured his left shoulder with a job related lifting injury.  Patient is currently on light duty at work and is hoping to return to full duty once his shoulder heals.  Patient with decreased left shoulder A/ROM, decreased strength, pain with overhead motion, and difficulty sleeping secondary to pain.  Patient would benefit from skilled PT to address his functional impairments to allow him to return to work without increased pain.  OBJECTIVE IMPAIRMENTS: decreased ROM, decreased strength, increased muscle spasms, impaired flexibility, postural dysfunction, and pain.   ACTIVITY LIMITATIONS: carrying, lifting, sleeping, reach over head, and caring for others  PARTICIPATION LIMITATIONS: community activity, occupation, and yard work  PERSONAL FACTORS: Profession and Time since onset of injury/illness/exacerbation are also affecting patient's functional outcome.   REHAB POTENTIAL: Good  CLINICAL DECISION MAKING: Stable/uncomplicated  EVALUATION COMPLEXITY: Low   GOALS: Goals reviewed with patient? Yes  SHORT TERM GOALS: Target date: 05/31/2023  Pt will be independent with initial HEP. Baseline: Goal status: INITIAL  2.  Patient will increase left shoulder flexion and abduction A/ROM by 10 degrees to allow patient to reach into shelves. Baseline:  Goal status: INITIAL   LONG TERM GOALS: Target date: 07/05/2023  Patient to be independent with advanced HEP to allow for self progression post discharge. Baseline:  Goal status: INITIAL  2.  Patient to increase left shoulder A/ROM to [facility] to allow him to reach overhead for job related tasks. Baseline: see above Goal status: INITIAL  3.  Patient to increase left shoulder strength to at least 4+/5 to allow him to  push/pull items as needed at work and to assist with transfers. Baseline: 3-/5 Goal status: INITIAL  4.  Patient to increase FOTO to at least 62 to demonstrate improvements in functional mobility. Baseline:  Goal status: INITIAL  5.  Patient able to demonstrate job simulated tasks in PT clinic without increased pain or to report ability to return to full duty at work without increased shoulder pain. Baseline:  Goal status: INITIAL    PLAN:  PT FREQUENCY: 1-2x/week  PT DURATION: 8 weeks  PLANNED INTERVENTIONS: Therapeutic exercises, Therapeutic activity, Neuromuscular re-education, Balance training, Gait training, Patient/Family education, Self Care, Joint mobilization, Joint manipulation, Aquatic Therapy, Dry Needling, Electrical stimulation, Spinal manipulation, Spinal mobilization, Cryotherapy, Moist heat, scar mobilization, Taping, Vasopneumatic device, Ultrasound, Ionotophoresis 4mg /ml Dexamethasone, Manual therapy, and Re-evaluation  PLAN FOR NEXT SESSION: Assess and progress HEP as indicated, pulleys, flexibility, ROM, strengthening   Xayvier Vallez, PT 05/15/2023, 11:15 AM   [facility] [address] [place], [place] [address] Phone # [phone] Fax [phone]

## 2023-05-17 ENCOUNTER — Ambulatory Visit: Payer: PRIVATE HEALTH INSURANCE | Admitting: Physical Therapy

## 2023-05-17 NOTE — Therapy (Signed)
OUTPATIENT PHYSICAL THERAPY SHOULDER TREATMENT   Patient Name: Bruce Simmons MRN: [id number] DOB:May 02, 1976, 47 y.o., male Today's Date: 05/20/2023  END OF SESSION:  PT End of Session - 05/20/23 0851     Visit Number 2    Date for PT Re-Evaluation 07/05/23    Authorization Type cone aetna    Authorization - Visit Number 2    Authorization - Number of Visits 12    PT Start Time [phone]    PT Stop Time 0939    PT Time Calculation (min) 48 min    Activity Tolerance Patient tolerated treatment well    Behavior During Therapy WFL for tasks assessed/performed              Past Medical History:  Diagnosis Date   Anxiety    GERD (gastroesophageal reflux disease)    Seasonal allergies    Sleep apnea    Sleep-related bruxism    Past Surgical History:  Procedure Laterality Date   SHOULDER ARTHROSCOPY WITH ROTATOR CUFF REPAIR AND SUBACROMIAL DECOMPRESSION Left 04/23/2023   Procedure: SHOULDER ARTHROSCOPY WITH ROTATOR CUFF DEBRIDEMENT VERSUS REPAIR  AND SUBACROMIAL DECOMPRESSION, OPEN BICEPS TENODESIS;  Surgeon: Jones Broom, MD;  Location: [facility];  Service: Orthopedics;  Laterality: Left;   Patient Active Problem List   Diagnosis Date Noted   OSA (obstructive sleep apnea) 11/11/2013    PCP: Rebecka Apley, NP  REFERRING PROVIDER: Jones Broom, MD  REFERRING DIAG: SHOULDER LONG HEAD BICEPS TENODESIS  THERAPY DIAG:  Acute pain of left shoulder  Muscle weakness (generalized)  Abnormal posture  Cramp and spasm  Rationale for Evaluation and Treatment: Rehabilitation  ONSET DATE: 04/23/2023 surgery date, actual injury occurred in April 2024  SUBJECTIVE:                                                                                                                                                                                      SUBJECTIVE STATEMENT: Everything is fine. No problems.  EVAL: Pt initially injured his left shoulder at  work in April 2024.  After failed conservative treatment, he had to undergo left shoulder arthroscopic debridement for partial rotator cuff tear, SLAP tear and subacromial bursa with left shoulder decompression and left shoulder subpectoral biceps tenodesis by Dr Ave Filter on 04/23/2023.  Hand dominance: Right  PERTINENT HISTORY: Left shoulder arthroscopic debridement for partial rotator cuff tear, SLAP tear and subacromial bursa with left shoulder decompression and left shoulder subpectoral biceps tenodesis by Dr Ave Filter on 04/23/2023 GERD, sleep apnea with CPAP  PAIN:  Are you having pain? Yes: NPRS scale: in general 0-2/10 Pain location: left shoulder Pain description: sore, but if  he moves wrong can be a quick/sharp 10/10 pain Aggravating factors: certain movements Relieving factors: rest  PRECAUTIONS: Other: Per MD:  okay for full A/ROM and P/ROM of shoulder, no resisted elbow flexion or supination  RED FLAGS: None   WEIGHT BEARING RESTRICTIONS: No  FALLS:  Has patient fallen in last 6 months? No   OCCUPATION: Works for [employer].  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  NEXT MD VISIT: Dr Ave Filter in October  OBJECTIVE:   DIAGNOSTIC FINDINGS:  Left shoulder MRI on 03/02/2023 (prior to surgery): IMPRESSION: 1. Moderate tendinosis of the supraspinatus tendon with a small partial-thickness bursal surface tear along the anterior-most aspect and a small insertional interstitial tears of the posterior most aspect. 2. Severe tendinosis of the  infraspinatus tendon. 3. Mild tendinosis of the intra-articular portion of the long head of the biceps tendon.  PATIENT SURVEYS:  FOTO 46 (projected 82 by visit 19)  COGNITION: Overall cognitive status: Within functional limits for tasks assessed     SENSATION: Denies numbness or tingling  POSTURE: Rounded shoulders, forward head  UPPER EXTREMITY ROM:   Active ROM Right eval Left eval  Shoulder flexion 170 63  Shoulder extension 50 35  Shoulder abduction 165 56  Shoulder adduction    Shoulder internal rotation    Shoulder external rotation    (Blank rows = not tested)  UPPER EXTREMITY MMT:  Eval:   Right shoulder is WFL Left shoulder is at least 3- to 3/5    TODAY'S TREATMENT:                                                                                                                                         DATE:  05/20/23 Seated pulleys with pool noodle behind him flexion and scaption x 2 min ea Seated ER with dowel x 20 Seated ranger ER/IR Standing ranger horizontal ER x 10 Seated towel slides flex, scaption, ABD x 10 ea Wall ladder ABD x 10 Wall slides flexion and scaption x 10 ea IASTM AND STM to Left deltoids and pectoralis muscles Vaso x 10 min med pressure max cold in sitting    05/15/2023 Reviewed HEP   PATIENT EDUCATION: Education details: Issued HEP Person educated: Patient Education method: Explanation, Facilities manager, and Handouts Education comprehension: verbalized understanding and returned demonstration  HOME EXERCISE PROGRAM: Access Code: [email or web address] URL: [email or web address] Date: 05/15/2023 Prepared by: Reather Laurence  Exercises - Seated Scapular Retraction  - 1 x daily -  7 x weekly - 2 sets - 10 reps - Circular Shoulder Pendulum with Table Support  - 1 x daily - 7 x weekly - 2 sets - 10 reps - Seated Shoulder Abduction Towel Slide at Table Top  - 1 x daily - 7 x weekly - 2 sets - 10 reps - Seated Shoulder Flexion  Towel Slide at Table Top  - 1 x daily - 7 x weekly - 2 sets - 10 reps - Shoulder Flexion Wall Slide with Towel  - 1 x daily - 7 x weekly - 2 sets - 10 reps - Standing Shoulder Abduction Slides at Wall  - 1 x daily - 7 x weekly - 2 sets - 10 reps  ASSESSMENT:  CLINICAL IMPRESSION: Jumal tolerated his first f/u visit fairly well but did have increased soreness by end of session. Good relief from vaso pressure at end as well as STM to L shoulder. He continues to demonstrate potential for improvement and would benefit from continued skilled therapy to address impairments.    OBJECTIVE IMPAIRMENTS: decreased ROM, decreased strength, increased muscle spasms, impaired flexibility, postural dysfunction, and pain.   ACTIVITY LIMITATIONS: carrying, lifting, sleeping, reach over head, and caring for others  PARTICIPATION LIMITATIONS: community activity, occupation, and yard work  PERSONAL FACTORS: Profession and Time since onset of injury/illness/exacerbation are also affecting patient's functional outcome.   REHAB POTENTIAL: Good  CLINICAL DECISION MAKING: Stable/uncomplicated  EVALUATION COMPLEXITY: Low   GOALS: Goals reviewed with patient? Yes  SHORT TERM GOALS: Target date: 05/31/2023  Pt will be independent with initial HEP. Baseline: Goal status: INITIAL  2.  Patient will increase left shoulder flexion and abduction A/ROM by 10 degrees to allow patient to reach into shelves. Baseline:  Goal status: INITIAL   LONG TERM GOALS: Target date: 07/05/2023  Patient to be independent with advanced HEP to allow for self progression post discharge. Baseline:  Goal status: INITIAL  2.  Patient to increase left shoulder A/ROM to [facility] to allow him to reach overhead for job related tasks. Baseline: see above Goal status: INITIAL  3.  Patient to increase left shoulder strength to at least 4+/5 to allow him to push/pull items as needed at work and to assist with transfers. Baseline:  3-/5 Goal status: INITIAL  4.  Patient to increase FOTO to at least 62 to demonstrate improvements in functional mobility. Baseline:  Goal status: INITIAL  5.  Patient able to demonstrate job simulated tasks in PT clinic without increased pain or to report ability to return to full duty at work without increased shoulder pain. Baseline:  Goal status: INITIAL    PLAN:  PT FREQUENCY: 1-2x/week  PT DURATION: 8 weeks  PLANNED INTERVENTIONS: Therapeutic exercises, Therapeutic activity, Neuromuscular re-education, Balance training, Gait training, Patient/Family education, Self Care, Joint mobilization, Joint manipulation, Aquatic Therapy, Dry Needling, Electrical stimulation, Spinal manipulation, Spinal mobilization, Cryotherapy, Moist heat, scar mobilization, Taping, Vasopneumatic device, Ultrasound, Ionotophoresis 4mg /ml Dexamethasone, Manual therapy, and Re-evaluation  PLAN FOR NEXT SESSION: Assess and progress HEP as indicated, pulleys, flexibility, ROM, strengthening   Solon Palm, PT  05/20/2023, 1:22 PM   [facility] [address] [place], [place] [address] Phone # [phone] Fax [phone]

## 2023-05-20 ENCOUNTER — Ambulatory Visit: Payer: PRIVATE HEALTH INSURANCE | Attending: Orthopedic Surgery | Admitting: Physical Therapy

## 2023-05-20 ENCOUNTER — Encounter: Payer: Self-pay | Admitting: Physical Therapy

## 2023-05-20 DIAGNOSIS — M25512 Pain in left shoulder: Secondary | ICD-10-CM | POA: Diagnosis present

## 2023-05-20 DIAGNOSIS — R293 Abnormal posture: Secondary | ICD-10-CM | POA: Insufficient documentation

## 2023-05-20 DIAGNOSIS — M6281 Muscle weakness (generalized): Secondary | ICD-10-CM | POA: Insufficient documentation

## 2023-05-20 DIAGNOSIS — R252 Cramp and spasm: Secondary | ICD-10-CM | POA: Diagnosis present

## 2023-05-22 ENCOUNTER — Ambulatory Visit: Payer: PRIVATE HEALTH INSURANCE | Attending: Orthopedic Surgery

## 2023-05-22 DIAGNOSIS — X58XXXD Exposure to other specified factors, subsequent encounter: Secondary | ICD-10-CM | POA: Insufficient documentation

## 2023-05-22 DIAGNOSIS — S43432D Superior glenoid labrum lesion of left shoulder, subsequent encounter: Secondary | ICD-10-CM | POA: Diagnosis present

## 2023-05-22 DIAGNOSIS — M62838 Other muscle spasm: Secondary | ICD-10-CM

## 2023-05-22 DIAGNOSIS — M6281 Muscle weakness (generalized): Secondary | ICD-10-CM

## 2023-05-22 DIAGNOSIS — M25512 Pain in left shoulder: Secondary | ICD-10-CM

## 2023-05-22 DIAGNOSIS — R252 Cramp and spasm: Secondary | ICD-10-CM

## 2023-05-22 NOTE — Therapy (Signed)
OUTPATIENT PHYSICAL THERAPY SHOULDER TREATMENT   Patient Name: Bruce Simmons MRN: [id number] DOB:1976/05/01, 47 y.o., male Today's Date: 05/22/2023  END OF SESSION:  PT End of Session - 05/22/23 1101     Visit Number 3    Date for PT Re-Evaluation 07/05/23    Authorization Type [employer] aetna    Authorization - Number of Visits 12    PT Start Time 1102    PT Stop Time 1143    PT Time Calculation (min) 41 min    Activity Tolerance Patient tolerated treatment well    Behavior During Therapy WFL for tasks assessed/performed              Past Medical History:  Diagnosis Date   Anxiety    GERD (gastroesophageal reflux disease)    Seasonal allergies    Sleep apnea    Sleep-related bruxism    Past Surgical History:  Procedure Laterality Date   SHOULDER ARTHROSCOPY WITH ROTATOR CUFF REPAIR AND SUBACROMIAL DECOMPRESSION Left 04/23/2023   Procedure: SHOULDER ARTHROSCOPY WITH ROTATOR CUFF DEBRIDEMENT VERSUS REPAIR  AND SUBACROMIAL DECOMPRESSION, OPEN BICEPS TENODESIS;  Surgeon: Jones Broom, MD;  Location: [facility];  Service: Orthopedics;  Laterality: Left;   Patient Active Problem List   Diagnosis Date Noted   OSA (obstructive sleep apnea) 11/11/2013    PCP: Rebecka Apley, NP  REFERRING PROVIDER: Jones Broom, MD  REFERRING DIAG: SHOULDER LONG HEAD BICEPS TENODESIS  THERAPY DIAG:  Acute pain of left shoulder  Muscle weakness (generalized)  Cramp and spasm  Other muscle spasm  Rationale for Evaluation and Treatment: Rehabilitation  ONSET DATE: 04/23/2023 surgery date, actual injury occurred in April 2024  SUBJECTIVE:                                                                                                                                                                                      SUBJECTIVE STATEMENT: Patient reports no pain upon entering today.  He states that he does still get an occasional sharp pain with random  positions of the arm.   EVAL: Pt initially injured his left shoulder at work in April 2024.  After failed conservative treatment, he had to undergo left shoulder arthroscopic debridement for partial rotator cuff tear, SLAP tear and subacromial bursa with left shoulder decompression and left shoulder subpectoral biceps tenodesis by Dr Ave Filter on 04/23/2023.  Hand dominance: Right  PERTINENT HISTORY: Left shoulder arthroscopic debridement for partial rotator cuff tear, SLAP tear and subacromial bursa with left shoulder decompression and left shoulder subpectoral biceps tenodesis by Dr Ave Filter on 04/23/2023 GERD, sleep apnea with CPAP  PAIN:  Are you having pain? Yes:  NPRS scale: in general 0-2/10 Pain location: left shoulder Pain description: sore, but if he moves wrong can be a quick/sharp 10/10 pain Aggravating factors: certain movements Relieving factors: rest  PRECAUTIONS: Other: Per MD:  okay for full A/ROM and P/ROM of shoulder, no resisted elbow flexion or supination  RED FLAGS: None   WEIGHT BEARING RESTRICTIONS: No  FALLS:  Has patient fallen in last 6 months? No   OCCUPATION: Works for [employer].  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  NEXT MD VISIT: Dr Ave Filter in October  OBJECTIVE:   DIAGNOSTIC FINDINGS:  Left shoulder MRI on 03/02/2023 (prior to surgery): IMPRESSION: 1. Moderate tendinosis of the supraspinatus tendon with a small partial-thickness bursal surface tear along the anterior-most aspect and a small insertional  interstitial tears of the posterior most aspect. 2. Severe tendinosis of the infraspinatus tendon. 3. Mild tendinosis of the intra-articular portion of the long head of the biceps tendon.  PATIENT SURVEYS:  FOTO 46 (projected 62 by visit 19)  COGNITION: Overall cognitive status: Within functional limits for tasks assessed     SENSATION: Denies numbness or tingling  POSTURE: Rounded shoulders, forward head  UPPER EXTREMITY ROM:   Active ROM Right eval Left eval  Shoulder flexion 170 63  Shoulder extension 50 35  Shoulder abduction 165 56  Shoulder adduction    Shoulder internal rotation    Shoulder external rotation    (Blank rows = not tested)  UPPER EXTREMITY MMT:  Eval:   Right shoulder is WFL Left shoulder is at least 3- to 3/5    TODAY'S TREATMENT:                                                                                                                                         DATE:  05/22/23 UBE x 4 min fwd only level 1 - patient instructed to go at a less aggressive pace and to do fwd only and to stop if pain occurred Seated pulleys with pool noodle behind him flexion and scaption x 2 min ea Left Standing pulley for IR left shoulder x 2 min Attempted AAROM in supine but patient was having excessive anterior shoulder pain PROM to right shoulder in supine all planes of motion Ice to right shoulder x 10 min   DATE:  05/20/23 Seated pulleys with pool noodle behind him flexion and scaption x 2 min ea Seated ER with dowel x 20 Seated ranger ER/IR Standing ranger  horizontal ER x 10 Seated towel slides flex, scaption, ABD x 10 ea Wall ladder ABD x 10 Wall slides flexion and scaption x 10 ea IASTM AND STM to Left deltoids and pectoralis muscles Vaso x 10 min med pressure max cold in sitting    05/15/2023 Reviewed HEP   PATIENT EDUCATION: Education details: Issued HEP Person educated: Patient Education method: Explanation, Facilities manager, and  Handouts Education comprehension: verbalized understanding and returned demonstration  HOME EXERCISE PROGRAM: Access Code: [email or web address] URL: [email or web address] Date: 05/15/2023 Prepared by: Clydie Braun Menke  Exercises - Seated Scapular Retraction  - 1 x daily - 7 x weekly - 2 sets - 10 reps - Circular Shoulder Pendulum with Table Support  - 1 x daily - 7 x weekly - 2 sets - 10 reps - Seated Shoulder Abduction Towel Slide at Table Top  - 1 x daily - 7 x weekly - 2 sets - 10 reps - Seated Shoulder Flexion Towel Slide at Table Top  - 1 x daily - 7 x weekly - 2 sets - 10 reps - Shoulder Flexion Wall Slide with Towel  - 1 x daily - 7 x weekly - 2 sets - 10 reps - Standing Shoulder Abduction Slides at Wall  - 1 x daily - 7 x weekly - 2 sets - 10 reps  ASSESSMENT:  CLINICAL IMPRESSION: Toney Sang is now 4 weeks post op.  We attempted AAROM in supine but he had significant pain with this.  We opted for PROM and he was able to tolerate this with ease.  Ice post treatment to control any inflammation.   He continues to demonstrate potential for improvement and would benefit from continued skilled therapy to address impairments.    OBJECTIVE IMPAIRMENTS: decreased ROM, decreased strength, increased muscle spasms, impaired flexibility, postural dysfunction, and pain.   ACTIVITY LIMITATIONS: carrying, lifting, sleeping, reach over head, and caring for others  PARTICIPATION LIMITATIONS: community activity, occupation, and yard work  PERSONAL FACTORS: Profession and Time since onset of injury/illness/exacerbation are also affecting patient's functional outcome.   REHAB POTENTIAL: Good  CLINICAL DECISION MAKING: Stable/uncomplicated  EVALUATION COMPLEXITY: Low   GOALS: Goals reviewed with patient? Yes  SHORT TERM GOALS: Target date: 05/31/2023  Pt will be independent with initial HEP. Baseline: Goal status: INITIAL  2.  Patient will increase left shoulder flexion and abduction A/ROM  by 10 degrees to allow patient to reach into shelves. Baseline:  Goal status: INITIAL   LONG TERM GOALS: Target date: 07/05/2023  Patient to be independent with advanced HEP to allow for self progression post discharge. Baseline:  Goal status: INITIAL  2.  Patient to increase left shoulder A/ROM to [facility] to allow him to reach overhead for job related tasks. Baseline: see above Goal status: INITIAL  3.  Patient to increase left shoulder strength to at least 4+/5 to allow him to push/pull items as needed at work and to assist with transfers. Baseline: 3-/5 Goal status: INITIAL  4.  Patient to increase FOTO to at least 62 to demonstrate improvements in functional mobility. Baseline:  Goal status: INITIAL  5.  Patient able to demonstrate job simulated tasks in PT clinic without increased pain or to report ability to return to full duty at work without increased shoulder pain. Baseline:  Goal status: INITIAL    PLAN:  PT FREQUENCY: 1-2x/week  PT DURATION: 8 weeks  PLANNED INTERVENTIONS: Therapeutic exercises, Therapeutic activity, Neuromuscular re-education, Balance training, Gait training, Patient/Family education, Self Care, Joint mobilization, Joint manipulation, Aquatic Therapy,  Dry Needling, Electrical stimulation, Spinal manipulation, Spinal mobilization, Cryotherapy, Moist heat, scar mobilization, Taping, Vasopneumatic device, Ultrasound, Ionotophoresis 4mg /ml Dexamethasone, Manual therapy, and Re-evaluation  PLAN FOR NEXT SESSION: Progress HEP as indicated, pulleys, flexibility, ROM, strengthening   Elliannah Wayment B. Yakelin Grenier, PT 05/22/23 2:00 PM  [facility] [address] [place], [place] [address] Phone # [phone] Fax [phone]

## 2023-05-26 NOTE — Therapy (Signed)
OUTPATIENT PHYSICAL THERAPY SHOULDER TREATMENT   Patient Name: Bruce Simmons MRN: [id number] DOB:03-Oct-1975, 47 y.o., male Today's Date: 05/26/2023  END OF SESSION:     Past Medical History:  Diagnosis Date   Anxiety    GERD (gastroesophageal reflux disease)    Seasonal allergies    Sleep apnea    Sleep-related bruxism    Past Surgical History:  Procedure Laterality Date   SHOULDER ARTHROSCOPY WITH ROTATOR CUFF REPAIR AND SUBACROMIAL DECOMPRESSION Left 04/23/2023   Procedure: SHOULDER ARTHROSCOPY WITH ROTATOR CUFF DEBRIDEMENT VERSUS REPAIR  AND SUBACROMIAL DECOMPRESSION, OPEN BICEPS TENODESIS;  Surgeon: Jones Broom, MD;  Location: [facility];  Service: Orthopedics;  Laterality: Left;   Patient Active Problem List   Diagnosis Date Noted   OSA (obstructive sleep apnea) 11/11/2013    PCP: Rebecka Apley, NP  REFERRING PROVIDER: Jones Broom, MD  REFERRING DIAG: SHOULDER LONG HEAD BICEPS TENODESIS  THERAPY DIAG:  No diagnosis found.  Rationale for Evaluation and Treatment: Rehabilitation  ONSET DATE: 04/23/2023 surgery date, actual injury occurred in April 2024  SUBJECTIVE:                                                                                                                                                                                      SUBJECTIVE STATEMENT: ***  EVAL: Pt initially injured his left shoulder at work in April 2024.  After failed conservative treatment, he had to undergo left shoulder arthroscopic debridement for partial rotator cuff tear, SLAP tear and subacromial bursa with left shoulder decompression and left shoulder subpectoral biceps tenodesis by Dr Ave Filter on 04/23/2023.  Hand dominance: Right  PERTINENT HISTORY: Left shoulder arthroscopic debridement for partial rotator cuff tear, SLAP tear and subacromial bursa with left shoulder decompression and left shoulder subpectoral biceps tenodesis by Dr  Ave Filter on 04/23/2023 GERD, sleep apnea with CPAP  PAIN:  Are you having pain? Yes: NPRS scale: in general 0-2/10 Pain location: left shoulder Pain description: sore, but if he moves wrong can be a quick/sharp 10/10 pain Aggravating factors: certain movements Relieving factors: rest  PRECAUTIONS: Other: Per MD:  okay for full A/ROM and P/ROM of shoulder, no resisted elbow flexion or supination  RED FLAGS: None   WEIGHT BEARING RESTRICTIONS: No  FALLS:  Has patient fallen in last 6 months? No   OCCUPATION: Works for [employer].  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  PLOF: Independent, Vocation/Vocational requirements: computer if in the office, but when in the field needs to be able to lift, push, pull,  and Leisure: enjoys volunteering for [employer]  PATIENT GOALS:To be able to return to work full duty without increased pain and to be able to perform other desired activities without increased pain.  NEXT MD VISIT: Dr Ave Filter in October  OBJECTIVE:   DIAGNOSTIC FINDINGS:  Left shoulder MRI on 03/02/2023 (prior to surgery): IMPRESSION: 1. Moderate tendinosis of the supraspinatus tendon with a small partial-thickness bursal surface tear along the anterior-most aspect and a small insertional interstitial tears of the posterior most aspect. 2. Severe tendinosis of the infraspinatus tendon. 3. Mild tendinosis of the intra-articular portion of the long head of the biceps tendon.  PATIENT SURVEYS:  FOTO 46 (projected 62 by visit 19)  COGNITION: Overall cognitive status: Within functional limits for tasks assessed     SENSATION: Denies numbness or tingling  POSTURE: Rounded shoulders, forward head  UPPER EXTREMITY ROM:   Active ROM Right eval Left eval  Shoulder flexion 170 63  Shoulder extension 50 35  Shoulder abduction 165 56  Shoulder adduction    Shoulder  internal rotation    Shoulder external rotation    (Blank rows = not tested)  UPPER EXTREMITY MMT:  Eval:   Right shoulder is WFL Left shoulder is at least 3- to 3/5    TODAY'S TREATMENT:                                                                                                                                         DATE:  05/27/23 UBE x 4 min fwd only level 1 - patient instructed to go at a less aggressive pace and to do fwd only and to stop if pain occurred Seated pulleys with pool noodle behind him flexion and scaption x 2 min ea Left Standing pulley for IR left shoulder x 2 min Attempted AAROM in supine but patient was having excessive anterior shoulder pain PROM to right shoulder in supine all planes of motion Ice to right shoulder x 10 min   05/22/23 UBE x 4 min fwd only level 1 - patient instructed to go at a less aggressive pace and to do fwd only and to stop if pain occurred Seated pulleys with pool noodle behind him flexion and scaption x 2 min ea Left Standing pulley for IR left shoulder x 2 min Attempted AAROM in supine but patient was having excessive anterior shoulder pain PROM to right shoulder in supine all planes of motion Ice to right shoulder x 10 min   DATE:  05/20/23 Seated pulleys with pool noodle behind him flexion and scaption x 2 min ea Seated ER with dowel x 20 Seated ranger ER/IR Standing ranger horizontal ER x 10 Seated towel slides flex, scaption, ABD x 10 ea Wall ladder ABD x 10 Wall slides flexion and scaption x 10 ea IASTM AND STM to Left deltoids and pectoralis muscles Vaso x 10 min med pressure max cold in  sitting    05/15/2023 Reviewed HEP   PATIENT EDUCATION: Education details: Issued HEP Person educated: Patient Education method: Explanation, Demonstration, and Handouts Education comprehension: verbalized understanding and returned demonstration  HOME EXERCISE PROGRAM: Access Code: [email or web address] URL:  [email or web address] Date: 05/15/2023 Prepared by: Clydie Braun Menke  Exercises - Seated Scapular Retraction  - 1 x daily - 7 x weekly - 2 sets - 10 reps - Circular Shoulder Pendulum with Table Support  - 1 x daily - 7 x weekly - 2 sets - 10 reps - Seated Shoulder Abduction Towel Slide at Table Top  - 1 x daily - 7 x weekly - 2 sets - 10 reps - Seated Shoulder Flexion Towel Slide at Table Top  - 1 x daily - 7 x weekly - 2 sets - 10 reps - Shoulder Flexion Wall Slide with Towel  - 1 x daily - 7 x weekly - 2 sets - 10 reps - Standing Shoulder Abduction Slides at Wall  - 1 x daily - 7 x weekly - 2 sets - 10 reps  ASSESSMENT:  CLINICAL IMPRESSION: ***  OBJECTIVE IMPAIRMENTS: decreased ROM, decreased strength, increased muscle spasms, impaired flexibility, postural dysfunction, and pain.   ACTIVITY LIMITATIONS: carrying, lifting, sleeping, reach over head, and caring for others  PARTICIPATION LIMITATIONS: community activity, occupation, and yard work  PERSONAL FACTORS: Profession and Time since onset of injury/illness/exacerbation are also affecting patient's functional outcome.   REHAB POTENTIAL: Good  CLINICAL DECISION MAKING: Stable/uncomplicated  EVALUATION COMPLEXITY: Low   GOALS: Goals reviewed with patient? Yes  SHORT TERM GOALS: Target date: 05/31/2023  Pt will be independent with initial HEP. Baseline: Goal status: INITIAL  2.  Patient will increase left shoulder flexion and abduction A/ROM by 10 degrees to allow patient to reach into shelves. Baseline:  Goal status: INITIAL   LONG TERM GOALS: Target date: 07/05/2023  Patient to be independent with advanced HEP to allow for self progression post discharge. Baseline:  Goal status: INITIAL  2.  Patient to increase left shoulder A/ROM to [facility] to allow him to reach overhead for job related tasks. Baseline: see above Goal status: INITIAL  3.  Patient to increase left shoulder strength to at least 4+/5  to allow him to push/pull items as needed at work and to assist with transfers. Baseline: 3-/5 Goal status: INITIAL  4.  Patient to increase FOTO to at least 62 to demonstrate improvements in functional mobility. Baseline:  Goal status: INITIAL  5.  Patient able to demonstrate job simulated tasks in PT clinic without increased pain or to report ability to return to full duty at work without increased shoulder pain. Baseline:  Goal status: INITIAL    PLAN:  PT FREQUENCY: 1-2x/week  PT DURATION: 8 weeks  PLANNED INTERVENTIONS: Therapeutic exercises, Therapeutic activity, Neuromuscular re-education, Balance training, Gait training, Patient/Family education, Self Care, Joint mobilization, Joint manipulation, Aquatic Therapy, Dry Needling, Electrical stimulation, Spinal manipulation, Spinal mobilization, Cryotherapy, Moist heat, scar mobilization, Taping, Vasopneumatic device, Ultrasound, Ionotophoresis 4mg /ml Dexamethasone, Manual therapy, and Re-evaluation  PLAN FOR NEXT SESSION: Progress HEP as indicated, pulleys, flexibility, ROM, strengthening   Jennifer B. Fields, PT 05/26/23 6:52 PM  [facility] [address], [address] [place], [place] [address] Phone # [phone] Fax [phone]

## 2023-05-27 ENCOUNTER — Ambulatory Visit: Payer: PRIVATE HEALTH INSURANCE | Admitting: Physical Therapy

## 2023-05-27 ENCOUNTER — Encounter: Payer: Self-pay | Admitting: Physical Therapy

## 2023-05-27 DIAGNOSIS — M6281 Muscle weakness (generalized): Secondary | ICD-10-CM

## 2023-05-27 DIAGNOSIS — M25512 Pain in left shoulder: Secondary | ICD-10-CM

## 2023-05-27 DIAGNOSIS — R293 Abnormal posture: Secondary | ICD-10-CM

## 2023-05-27 DIAGNOSIS — M62838 Other muscle spasm: Secondary | ICD-10-CM

## 2023-05-27 DIAGNOSIS — R252 Cramp and spasm: Secondary | ICD-10-CM

## 2023-05-29 ENCOUNTER — Ambulatory Visit: Payer: PRIVATE HEALTH INSURANCE

## 2023-05-29 DIAGNOSIS — M25512 Pain in left shoulder: Secondary | ICD-10-CM | POA: Diagnosis not present

## 2023-05-29 DIAGNOSIS — R293 Abnormal posture: Secondary | ICD-10-CM

## 2023-05-29 DIAGNOSIS — R252 Cramp and spasm: Secondary | ICD-10-CM

## 2023-05-29 DIAGNOSIS — M6281 Muscle weakness (generalized): Secondary | ICD-10-CM

## 2023-05-29 NOTE — Therapy (Signed)
OUTPATIENT PHYSICAL THERAPY SHOULDER TREATMENT    Patient Name: Bruce Simmons MRN: [id number] DOB:1975/10/28, 47 y.o., male Today's Date: 05/29/2023  END OF SESSION:  PT End of Session - 05/29/23 0909     Visit Number 5    Date for PT Re-Evaluation 07/05/23    Authorization Type cone aetna    Authorization - Number of Visits 12    PT Start Time 0905    PT Stop Time 0950    PT Time Calculation (min) 45 min    Activity Tolerance Patient tolerated treatment well    Behavior During Therapy WFL for tasks assessed/performed               Past Medical History:  Diagnosis Date   Anxiety    GERD (gastroesophageal reflux disease)    Seasonal allergies    Sleep apnea    Sleep-related bruxism    Past Surgical History:  Procedure Laterality Date   SHOULDER ARTHROSCOPY WITH ROTATOR CUFF REPAIR AND SUBACROMIAL DECOMPRESSION Left 04/23/2023   Procedure: SHOULDER ARTHROSCOPY WITH ROTATOR CUFF DEBRIDEMENT VERSUS REPAIR  AND SUBACROMIAL DECOMPRESSION, OPEN BICEPS TENODESIS;  Surgeon: Jones Broom, MD;  Location: [facility];  Service: Orthopedics;  Laterality: Left;   Patient Active Problem List   Diagnosis Date Noted   OSA (obstructive sleep apnea) 11/11/2013    PCP: Rebecka Apley, NP  REFERRING PROVIDER: Jones Broom, MD  REFERRING DIAG: SHOULDER LONG HEAD BICEPS TENODESIS  THERAPY DIAG:  Acute pain of left shoulder  Muscle weakness (generalized)  Cramp and spasm  Abnormal posture  Rationale for Evaluation and Treatment: Rehabilitation  ONSET DATE: 04/23/2023 surgery date, actual injury occurred in April 2024  SUBJECTIVE:                                                                                                                                                                                      SUBJECTIVE STATEMENT: Pt reports he continues to get intermittent quick pain. Not any worse but did have a bout of increased soreness  yesterday and sat with ice on his shoulder most of the day.    EVAL: Pt initially injured his left shoulder at work in April 2024.  After failed conservative treatment, he had to undergo left shoulder arthroscopic debridement for partial rotator cuff tear, SLAP tear and subacromial bursa with left shoulder decompression and left shoulder subpectoral biceps tenodesis by Dr Ave Filter on 04/23/2023.  Hand dominance: Right  PERTINENT HISTORY: Left shoulder arthroscopic debridement for partial rotator cuff tear, SLAP tear and subacromial bursa with left shoulder decompression and left shoulder subpectoral biceps tenodesis by Dr Ave Filter on 04/23/2023 GERD, sleep apnea with  CPAP  PAIN:  Are you having pain? Yes: NPRS scale: in general 0-2 up to 10/10 Pain location: left shoulder Pain description: sore, but if he moves wrong can be a quick/sharp 10/10 pain Aggravating factors: certain movements Relieving factors: rest  PRECAUTIONS: Other: Per MD:  okay for full A/ROM and P/ROM of shoulder, no resisted elbow flexion or supination  RED FLAGS: None   WEIGHT BEARING RESTRICTIONS: No  FALLS:  Has patient fallen in last 6 months? No   OCCUPATION: Works for [employer].  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  NEXT MD VISIT: Dr Ave Filter in October  OBJECTIVE:   DIAGNOSTIC FINDINGS:  Left shoulder MRI on 03/02/2023 (prior to surgery): IMPRESSION: 1. Moderate tendinosis of the supraspinatus tendon with a small partial-thickness bursal surface tear along the  anterior-most aspect and a small insertional interstitial tears of the posterior most aspect. 2. Severe tendinosis of the infraspinatus tendon. 3. Mild tendinosis of the intra-articular portion of the long head of the biceps tendon.  PATIENT SURVEYS:  FOTO 46 (projected 19 by visit 19)  COGNITION: Overall cognitive status: Within functional limits for tasks assessed     SENSATION: Denies numbness or tingling  POSTURE: Rounded shoulders, forward head  UPPER EXTREMITY ROM:   Active ROM Right eval Left eval Left 05/27/23  Shoulder flexion 170 63 148 seated/145 supine   Shoulder extension 50 35 43  Shoulder abduction 165 56 62 seated/88 supine  Shoulder adduction     Shoulder internal rotation     Shoulder external rotation     (Blank rows = not tested)  UPPER EXTREMITY MMT:  Eval:   Right shoulder is WFL Left shoulder is at least 3- to 3/5    TODAY'S TREATMENT:                                                                                                                                         DATE:  05/29/23 UBE x 6 min fwd only level 1 - patient instructed to stop if pain occurred 3 way scapular stabilization with green loop (left) 4D ball rolls x 20 with light blue plyo ball Prone shoulder ext 2 x 10 with elbow flexed slightly 0# left Prone shoulder row (modified to protect biceps- avoided extending beyond neutral at starting point) 2 x 10 with 0# left Prone shoulder horizontal abduction with elbow slightly flexed 2 x 10 0# left Sidelying left shoulder ER with 0# 2 x  10 Supine serratus punch with active assist to get into position and to lower arm, then 2 x 10 with 0# Trigger Point Dry-Needling  Treatment instructions: Expect mild to moderate muscle soreness. S/S of pneumothorax if dry needled over a lung field, and to seek immediate medical attention should they occur. Patient verbalized understanding of these instructions and education. Patient Consent Given:  Yes Education handout provided: Yes Muscles treated: anterior, middle and posterior deltoid Electrical stimulation performed: No Parameters: N/A Treatment response/outcome: Skilled palpation used to identify taut bands and trigger points.  Once identified, dry needling techniques used to treat these areas.  Twitch response ellicited along with palpable elongation of muscle.  Following treatment, Patient reported minimal change in soreness but "maybe slightly less tension".   Ice to right shoulder x 10 min   05/27/23 UBE x 4 min fwd only level 1 - patient instructed to go at a less aggressive pace and to do fwd only and to stop if pain occurred Seated pulleys with pool noodle behind him flexion and scaption x 2 min ea Left Standing pulley for IR left shoulder x 2 min Attempted AAROM in supine but patient was having excessive anterior shoulder pain Standing flex, scaption, ABD x 10 ea in pain free range - cues for scap retraction Wall slides flexion x 10, Abd from 90 deg up x 10 Prone shoulder ext x 5 - stopped due to pain S/L ER x 10, then 2x10 with 3 sec hold at top S/L flexion x 10, ABD  Manual: IASTM with Graston tool to L ant shoulder, biceps and deltoids. STM to L deltoids. PROM in to ABD. Ice to right shoulder x 10 min   05/22/23 UBE x 4 min fwd only level 1 - patient instructed to go at a less aggressive pace and to do fwd only and to stop if pain occurred Seated pulleys with pool noodle behind him flexion and scaption x 2 min ea Left Standing pulley for IR left shoulder x 2 min Attempted AAROM in supine but patient was having excessive anterior shoulder pain PROM to right shoulder in supine all planes of motion Ice to right shoulder x 10 min    05/15/2023 Reviewed HEP   PATIENT EDUCATION: Education details: Issued HEP Person educated: Patient Education method: Programmer, multimedia, Facilities manager, and Handouts Education comprehension: verbalized understanding and returned  demonstration  HOME EXERCISE PROGRAM: Access Code: [email or web address] URL: [email or web address] Date: 05/15/2023 Prepared by: Clydie Braun Menke  Exercises - Seated Scapular Retraction  - 1 x daily - 7 x weekly - 2 sets - 10 reps - Circular Shoulder Pendulum with Table Support  - 1 x daily - 7 x weekly - 2 sets - 10 reps - Seated Shoulder Abduction Towel Slide at Table Top  - 1 x daily - 7 x weekly - 2 sets - 10 reps - Seated Shoulder Flexion Towel Slide at Table Top  - 1 x daily - 7 x weekly - 2 sets - 10 reps - Shoulder Flexion Wall Slide with Towel  - 1 x daily - 7 x weekly - 2 sets - 10 reps - Standing Shoulder Abduction Slides at Wall  - 1 x daily - 7 x weekly - 2 sets - 10 reps  ASSESSMENT:  CLINICAL IMPRESSION: Toney Sang is having continued soreness which is typical of his surgical procedure.  He has good return of ROM and is able to do strengthening exercises with modifications with no increase in pain.  He will likely continue to have  discomfort until approx 8 weeks once biceps anchor is fully engaged and subacromial area is calloused over.  We will continue to protect the biceps tenodesis with modifications.  We did try some dry needling today in the deltoid as it appeared to be quite tight and guarded. He had no twitch responses and only mild relief.  We will assess full response next visit and repeat if helpful.    Laquinton continues to demonstrate potential for improvement and would benefit from continued skilled therapy to address impairments.    OBJECTIVE IMPAIRMENTS: decreased ROM, decreased strength, increased muscle spasms, impaired flexibility, postural dysfunction, and pain.   ACTIVITY LIMITATIONS: carrying, lifting, sleeping, reach over head, and caring for others  PARTICIPATION LIMITATIONS: community activity, occupation, and yard work  PERSONAL FACTORS: Profession and Time since onset of injury/illness/exacerbation are also affecting patient's functional outcome.    REHAB POTENTIAL: Good  CLINICAL DECISION MAKING: Stable/uncomplicated  EVALUATION COMPLEXITY: Low   GOALS: Goals reviewed with patient? Yes  SHORT TERM GOALS: Target date: 05/31/2023  Pt will be independent with initial HEP. Baseline: Goal status: MET  2.  Patient will increase left shoulder flexion and abduction A/ROM by 10 degrees to allow patient to reach into shelves. Baseline:  Goal status: IN PROGRESS   LONG TERM GOALS: Target date: 07/05/2023  Patient to be independent with advanced HEP to allow for self progression post discharge. Baseline:  Goal status: INITIAL  2.  Patient to increase left shoulder A/ROM to [facility] to allow him to reach overhead for job related tasks. Baseline: see above Goal status: INITIAL  3.  Patient to increase left shoulder strength to at least 4+/5 to allow him to push/pull items as needed at work and to assist with transfers. Baseline: 3-/5 Goal status: INITIAL  4.  Patient to increase FOTO to at least 62 to demonstrate improvements in functional mobility. Baseline:  Goal status: INITIAL  5.  Patient able to demonstrate job simulated tasks in PT clinic without increased pain or to report ability to return to full duty at work without increased shoulder pain. Baseline:  Goal status: INITIAL    PLAN:  PT FREQUENCY: 1-2x/week  PT DURATION: 8 weeks  PLANNED INTERVENTIONS: Therapeutic exercises, Therapeutic activity, Neuromuscular re-education, Balance training, Gait training, Patient/Family education, Self Care, Joint mobilization, Joint manipulation, Aquatic Therapy, Dry Needling, Electrical stimulation, Spinal manipulation, Spinal mobilization, Cryotherapy, Moist heat, scar mobilization, Taping, Vasopneumatic device, Ultrasound, Ionotophoresis 4mg /ml Dexamethasone, Manual therapy, and Re-evaluation  PLAN FOR NEXT SESSION: Post shoulder scope protocol with biceps tenodesis precautions.  Patient is now 5 weeks post op.     Victorino Dike  B. Trevonn Hallum, PT 05/29/23 11:38 AM [facility] [address], [address] [place], [place] [address] Phone # [phone] Fax [phone]

## 2023-06-05 ENCOUNTER — Ambulatory Visit: Payer: PRIVATE HEALTH INSURANCE | Attending: Orthopedic Surgery

## 2023-06-05 DIAGNOSIS — R262 Difficulty in walking, not elsewhere classified: Secondary | ICD-10-CM | POA: Diagnosis present

## 2023-06-05 DIAGNOSIS — M62838 Other muscle spasm: Secondary | ICD-10-CM | POA: Diagnosis present

## 2023-06-05 DIAGNOSIS — R252 Cramp and spasm: Secondary | ICD-10-CM | POA: Diagnosis present

## 2023-06-05 DIAGNOSIS — M6281 Muscle weakness (generalized): Secondary | ICD-10-CM

## 2023-06-05 DIAGNOSIS — R293 Abnormal posture: Secondary | ICD-10-CM

## 2023-06-05 DIAGNOSIS — M25512 Pain in left shoulder: Secondary | ICD-10-CM | POA: Diagnosis present

## 2023-06-05 NOTE — Therapy (Signed)
OUTPATIENT PHYSICAL THERAPY SHOULDER TREATMENT    Patient Name: Bruce Simmons MRN: [id number] DOB:April 08, 1976, 47 y.o., male Today's Date: 06/05/2023  END OF SESSION:  PT End of Session - 06/05/23 0858     Visit Number 6    Date for PT Re-Evaluation 07/05/23    Authorization Type cone aetna    Authorization - Number of Visits 12    PT Start Time 0858    PT Stop Time 0936    PT Time Calculation (min) 38 min    Activity Tolerance Patient tolerated treatment well    Behavior During Therapy WFL for tasks assessed/performed               Past Medical History:  Diagnosis Date   Anxiety    GERD (gastroesophageal reflux disease)    Seasonal allergies    Sleep apnea    Sleep-related bruxism    Past Surgical History:  Procedure Laterality Date   SHOULDER ARTHROSCOPY WITH ROTATOR CUFF REPAIR AND SUBACROMIAL DECOMPRESSION Left 04/23/2023   Procedure: SHOULDER ARTHROSCOPY WITH ROTATOR CUFF DEBRIDEMENT VERSUS REPAIR  AND SUBACROMIAL DECOMPRESSION, OPEN BICEPS TENODESIS;  Surgeon: Jones Broom, MD;  Location: [facility];  Service: Orthopedics;  Laterality: Left;   Patient Active Problem List   Diagnosis Date Noted   OSA (obstructive sleep apnea) 11/11/2013    PCP: Rebecka Apley, NP  REFERRING PROVIDER: Jones Broom, MD  REFERRING DIAG: SHOULDER LONG HEAD BICEPS TENODESIS  THERAPY DIAG:  Acute pain of left shoulder  Muscle weakness (generalized)  Cramp and spasm  Abnormal posture  Other muscle spasm  Rationale for Evaluation and Treatment: Rehabilitation  ONSET DATE: 04/23/2023 surgery date, actual injury occurred in April 2024  SUBJECTIVE:                                                                                                                                                                                      SUBJECTIVE STATEMENT: Pt reports he is having a lot less pain and no sharp pains since last visit.  Pain 0/10 at the  moment.   EVAL: Pt initially injured his left shoulder at work in April 2024.  After failed conservative treatment, he had to undergo left shoulder arthroscopic debridement for partial rotator cuff tear, SLAP tear and subacromial bursa with left shoulder decompression and left shoulder subpectoral biceps tenodesis by Dr Ave Filter on 04/23/2023.  Hand dominance: Right  PERTINENT HISTORY: Left shoulder arthroscopic debridement for partial rotator cuff tear, SLAP tear and subacromial bursa with left shoulder decompression and left shoulder subpectoral biceps tenodesis by Dr Ave Filter on 04/23/2023 GERD, sleep apnea with CPAP  PAIN:  Are you having  pain? Yes: NPRS scale: in general 0-2 up to 10/10 Pain location: left shoulder Pain description: sore, but if he moves wrong can be a quick/sharp 10/10 pain Aggravating factors: certain movements Relieving factors: rest  PRECAUTIONS: Other: Per MD:  okay for full A/ROM and P/ROM of shoulder, no resisted elbow flexion or supination  RED FLAGS: None   WEIGHT BEARING RESTRICTIONS: No  FALLS:  Has patient fallen in last 6 months? No   OCCUPATION: Works for [employer].  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  NEXT MD VISIT: Dr Ave Filter in October  OBJECTIVE:   DIAGNOSTIC FINDINGS:  Left shoulder MRI on 03/02/2023 (prior to surgery): IMPRESSION: 1. Moderate tendinosis of the supraspinatus tendon with a small partial-thickness bursal surface tear along the anterior-most aspect and a small insertional  interstitial tears of the posterior most aspect. 2. Severe tendinosis of the infraspinatus tendon. 3. Mild tendinosis of the intra-articular portion of the long head of the biceps tendon.  PATIENT SURVEYS:  FOTO 46 (projected 43 by visit 19)  COGNITION: Overall cognitive status: Within functional limits for tasks assessed     SENSATION: Denies numbness or tingling  POSTURE: Rounded shoulders, forward head  UPPER EXTREMITY ROM:   Active ROM Right eval Left eval Left 05/27/23  Shoulder flexion 170 63 148 seated/145 supine   Shoulder extension 50 35 43  Shoulder abduction 165 56 62 seated/88 supine  Shoulder adduction     Shoulder internal rotation     Shoulder external rotation     (Blank rows = not tested)  UPPER EXTREMITY MMT:  Eval:   Right shoulder is WFL Left shoulder is at least 3- to 3/5    TODAY'S TREATMENT:                                                                                                                                         DATE: 06/05/23 UBE x 5 min fwd only level 1  3 way scapular stabilization with green loop (left) 4D ball rolls x 20 with light blue plyo ball Prone shoulder ext 2 x 10 with elbow flexed slightly 1# left Prone shoulder row (modified to protect biceps- avoided extending beyond neutral at starting point) 2 x 10 with 1# left Prone shoulder horizontal abduction with elbow slightly flexed 2 x 10 1# left Sidelying left shoulder ER with 1# 2 x 10 Supine serratus punch with active assist to get into position and to lower arm,  then 2 x 10 with 1# PROM to left shoulder all planes of motion Ice to left shoulder in hooklying x 10 min     05/29/23 UBE x 6 min fwd only level 1 - patient instructed to stop if pain occurred 3 way scapular stabilization with green loop (left) 4D ball rolls x 20 with light blue plyo ball Prone shoulder ext 2 x 10 with elbow flexed slightly 0# left Prone shoulder row (modified to protect biceps- avoided  extending beyond neutral at starting point) 2 x 10 with 0# left Prone shoulder horizontal abduction with elbow slightly flexed 2 x 10 0# left Sidelying left shoulder ER with 0# 2 x 10 Supine serratus punch with active assist to get into position and to lower arm, then 2 x 10 with 0# Trigger Point Dry-Needling  Treatment instructions: Expect mild to moderate muscle soreness. S/S of pneumothorax if dry needled over a lung field, and to seek immediate medical attention should they occur. Patient verbalized understanding of these instructions and education. Patient Consent Given: Yes Education handout provided: Yes Muscles treated: anterior, middle and posterior deltoid Electrical stimulation performed: No Parameters: N/A Treatment response/outcome: Skilled palpation used to identify taut bands and trigger points.  Once identified, dry needling techniques used to treat these areas.  Twitch response ellicited along with palpable elongation of muscle.  Following treatment, Patient reported minimal change in soreness but "maybe slightly less tension".   Ice to right shoulder x 10 min   05/27/23 UBE x 4 min fwd only level 1 - patient instructed to go at a less aggressive pace and to do fwd only and to stop if pain occurred Seated pulleys with pool noodle behind him flexion and scaption x 2 min ea Left Standing pulley for IR left shoulder x 2 min Attempted AAROM in supine but patient was having excessive anterior shoulder pain Standing flex, scaption, ABD x 10 ea in pain free range - cues for scap retraction Wall slides flexion x 10, Abd from 90 deg up x 10 Prone shoulder ext x 5 - stopped due to pain S/L ER x 10, then 2x10 with 3 sec hold at top S/L flexion x 10, ABD  Manual: IASTM with Graston tool to L ant shoulder, biceps and deltoids. STM to L deltoids. PROM in to ABD. Ice to right shoulder x 10 min   05/22/23 UBE x 4 min fwd only level 1 - patient instructed to go at a less aggressive pace and  to do fwd only and to stop if pain occurred Seated pulleys with pool noodle behind him flexion and scaption x 2 min ea Left Standing pulley for IR left shoulder x 2 min Attempted AAROM in supine but patient was having excessive anterior shoulder pain PROM to right shoulder in supine all planes of motion Ice to right shoulder x 10 min    05/15/2023 Reviewed HEP   PATIENT EDUCATION: Education details: Issued HEP Person educated: Patient Education method: Programmer, multimedia, Facilities manager, and Handouts Education comprehension: verbalized understanding and returned demonstration  HOME EXERCISE PROGRAM: Access Code: [email or web address] URL: [email or web address] Date: 05/15/2023 Prepared by: Clydie Braun Menke  Exercises - Seated Scapular Retraction  - 1 x daily - 7 x weekly - 2 sets - 10 reps - Circular Shoulder Pendulum with Table Support  - 1 x daily - 7 x weekly - 2 sets - 10 reps - Seated Shoulder Abduction Towel Slide at Table Top  - 1 x daily - 7 x weekly - 2 sets -  10 reps - Seated Shoulder Flexion Towel Slide at Table Top  - 1 x daily - 7 x weekly - 2 sets - 10 reps - Shoulder Flexion Wall Slide with Towel  - 1 x daily - 7 x weekly - 2 sets - 10 reps - Standing Shoulder Abduction Slides at Wall  - 1 x daily - 7 x weekly - 2 sets - 10 reps  ASSESSMENT:  CLINICAL IMPRESSION: Toney Sang is having less pain today.  Saw provider for f/u and provider feels he is healing as expected.  No concerns.  He had no pain with any activities today with exception of serratus punch.  We had him support the shoulder and do this active assisted.  He has good ROM with some minor posterior capsule tightness.  He is progressing appropriately.  He should continue to improve.  He would benefit from continued skilled PT to meet final goals.    OBJECTIVE IMPAIRMENTS: decreased ROM, decreased strength, increased muscle spasms, impaired flexibility, postural dysfunction, and pain.   ACTIVITY LIMITATIONS: carrying,  lifting, sleeping, reach over head, and caring for others  PARTICIPATION LIMITATIONS: community activity, occupation, and yard work  PERSONAL FACTORS: Profession and Time since onset of injury/illness/exacerbation are also affecting patient's functional outcome.   REHAB POTENTIAL: Good  CLINICAL DECISION MAKING: Stable/uncomplicated  EVALUATION COMPLEXITY: Low   GOALS: Goals reviewed with patient? Yes  SHORT TERM GOALS: Target date: 05/31/2023  Pt will be independent with initial HEP. Baseline: Goal status: MET  2.  Patient will increase left shoulder flexion and abduction A/ROM by 10 degrees to allow patient to reach into shelves. Baseline:  Goal status: IN PROGRESS   LONG TERM GOALS: Target date: 07/05/2023  Patient to be independent with advanced HEP to allow for self progression post discharge. Baseline:  Goal status: IN PROGRESS 06/05/23  2.  Patient to increase left shoulder A/ROM to [facility] to allow him to reach overhead for job related tasks. Baseline: see above Goal status: MET 06/05/23  3.  Patient to increase left shoulder strength to at least 4+/5 to allow him to push/pull items as needed at work and to assist with transfers. Baseline: 3-/5 Goal status: INITIAL  4.  Patient to increase FOTO to at least 62 to demonstrate improvements in functional mobility. Baseline:  Goal status: INITIAL  5.  Patient able to demonstrate job simulated tasks in PT clinic without increased pain or to report ability to return to full duty at work without increased shoulder pain. Baseline:  Goal status: INITIAL    PLAN:  PT FREQUENCY: 1-2x/week  PT DURATION: 8 weeks  PLANNED INTERVENTIONS: Therapeutic exercises, Therapeutic activity, Neuromuscular re-education, Balance training, Gait training, Patient/Family education, Self Care, Joint mobilization, Joint manipulation, Aquatic Therapy, Dry Needling, Electrical stimulation, Spinal manipulation, Spinal mobilization, Cryotherapy,  Moist heat, scar mobilization, Taping, Vasopneumatic device, Ultrasound, Ionotophoresis 4mg /ml Dexamethasone, Manual therapy, and Re-evaluation  PLAN FOR NEXT SESSION: Post shoulder scope protocol with biceps tenodesis precautions.  Patient is now 6 weeks post op.     Victorino Dike B. Juliany Daughety, PT 06/05/23 1:59 PM [facility] [address] [place], [place] [address] Phone # [phone] Fax [phone]

## 2023-06-12 ENCOUNTER — Ambulatory Visit: Payer: PRIVATE HEALTH INSURANCE

## 2023-06-12 DIAGNOSIS — M25512 Pain in left shoulder: Secondary | ICD-10-CM | POA: Diagnosis not present

## 2023-06-12 DIAGNOSIS — R252 Cramp and spasm: Secondary | ICD-10-CM

## 2023-06-12 DIAGNOSIS — R293 Abnormal posture: Secondary | ICD-10-CM

## 2023-06-12 DIAGNOSIS — M6281 Muscle weakness (generalized): Secondary | ICD-10-CM

## 2023-06-12 DIAGNOSIS — M62838 Other muscle spasm: Secondary | ICD-10-CM

## 2023-06-12 NOTE — Therapy (Signed)
OUTPATIENT PHYSICAL THERAPY SHOULDER TREATMENT    Patient Name: Bruce Simmons MRN: [id number] DOB:1976-01-05, 47 y.o., male Today's Date: 06/12/2023  END OF SESSION:  PT End of Session - 06/12/23 0948     Visit Number 7    Date for PT Re-Evaluation 07/05/23    Authorization Type cone aetna    Authorization - Number of Visits 12    PT Start Time 0945    PT Stop Time 1016    PT Time Calculation (min) 31 min    Activity Tolerance Patient tolerated treatment well    Behavior During Therapy WFL for tasks assessed/performed               Past Medical History:  Diagnosis Date   Anxiety    GERD (gastroesophageal reflux disease)    Seasonal allergies    Sleep apnea    Sleep-related bruxism    Past Surgical History:  Procedure Laterality Date   SHOULDER ARTHROSCOPY WITH ROTATOR CUFF REPAIR AND SUBACROMIAL DECOMPRESSION Left 04/23/2023   Procedure: SHOULDER ARTHROSCOPY WITH ROTATOR CUFF DEBRIDEMENT VERSUS REPAIR  AND SUBACROMIAL DECOMPRESSION, OPEN BICEPS TENODESIS;  Surgeon: Jones Broom, MD;  Location: [facility];  Service: Orthopedics;  Laterality: Left;   Patient Active Problem List   Diagnosis Date Noted   OSA (obstructive sleep apnea) 11/11/2013    PCP: Rebecka Apley, NP  REFERRING PROVIDER: Jones Broom, MD  REFERRING DIAG: SHOULDER LONG HEAD BICEPS TENODESIS  THERAPY DIAG:  Acute pain of left shoulder  Muscle weakness (generalized)  Cramp and spasm  Abnormal posture  Other muscle spasm  Rationale for Evaluation and Treatment: Rehabilitation  ONSET DATE: 04/23/2023 surgery date, actual injury occurred in April 2024  SUBJECTIVE:                                                                                                                                                                                      SUBJECTIVE STATEMENT: Pt is now 7 weeks post op.  He reports he is doing great.  Pain 0/10.   EVAL: Pt initially  injured his left shoulder at work in April 2024.  After failed conservative treatment, he had to undergo left shoulder arthroscopic debridement for partial rotator cuff tear, SLAP tear and subacromial bursa with left shoulder decompression and left shoulder subpectoral biceps tenodesis by Dr Ave Filter on 04/23/2023.  Hand dominance: Right  PERTINENT HISTORY: Left shoulder arthroscopic debridement for partial rotator cuff tear, SLAP tear and subacromial bursa with left shoulder decompression and left shoulder subpectoral biceps tenodesis by Dr Ave Filter on 04/23/2023 GERD, sleep apnea with CPAP  PAIN:  Are you having pain? Yes: NPRS scale: in  general 0-2 up to 10/10 Pain location: left shoulder Pain description: sore, but if he moves wrong can be a quick/sharp 10/10 pain Aggravating factors: certain movements Relieving factors: rest  PRECAUTIONS: Other: Per MD:  okay for full A/ROM and P/ROM of shoulder, no resisted elbow flexion or supination  RED FLAGS: None   WEIGHT BEARING RESTRICTIONS: No  FALLS:  Has patient fallen in last 6 months? No   OCCUPATION: Works for [employer].  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  NEXT MD VISIT: Dr Ave Filter in October  OBJECTIVE:   DIAGNOSTIC FINDINGS:  Left shoulder MRI on 03/02/2023 (prior to surgery): IMPRESSION: 1. Moderate tendinosis of the supraspinatus tendon with a small partial-thickness bursal surface tear along the anterior-most aspect and a small insertional interstitial tears of the posterior  most aspect. 2. Severe tendinosis of the infraspinatus tendon. 3. Mild tendinosis of the intra-articular portion of the long head of the biceps tendon.  PATIENT SURVEYS:  FOTO 46 (projected 80 by visit 19)  COGNITION: Overall cognitive status: Within functional limits for tasks assessed     SENSATION: Denies numbness or tingling  POSTURE: Rounded shoulders, forward head  UPPER EXTREMITY ROM:   Active ROM Right eval Left eval Left 05/27/23  Shoulder flexion 170 63 148 seated/145 supine   Shoulder extension 50 35 43  Shoulder abduction 165 56 62 seated/88 supine  Shoulder adduction     Shoulder internal rotation     Shoulder external rotation     (Blank rows = not tested)  UPPER EXTREMITY MMT:  Eval:   Right shoulder is WFL Left shoulder is at least 3- to 3/5    TODAY'S TREATMENT:     LEFT SHOULDER                                                                                                                                    DATE: 06/12/23 UBE x 5 min fwd only level 1  3 way scapular stabilization with green loop (left) 4D ball rolls x 20 with light blue plyo ball Seated eccentric biceps lowering with 2 lbs Standing shoulder flexion and scaption with 0 lbs 2 x 10 Prone shoulder ext 2 x 10 with 2# left Prone shoulder row  2 x 10 with 2# left Prone shoulder horizontal abduction 2 x 10 2# left Sidelying left shoulder ER with 2# 2 x 10 Supine serratus punch 2 x 10 with 2# ROM re-checked: all have returned to [facility] (some minor  end range discomfort most likely due to subacromial area still not quite calloused over Patient declined ice: needed to get back to work   06/05/23 UBE x 5 min fwd only level 1  3 way scapular stabilization with green loop (left) 4D ball rolls x 20 with light blue plyo ball Prone shoulder ext 2 x 10 with elbow flexed slightly 1# left Prone shoulder row (modified to protect biceps- avoided extending beyond neutral at starting point) 2 x 10 with  1# left Prone shoulder horizontal abduction with elbow slightly flexed 2 x 10 1# left Sidelying left shoulder ER with 1# 2 x 10 Supine serratus punch with active assist to get into position and to lower arm, then 2 x 10 with 1# PROM to left shoulder all planes of motion Ice to left shoulder in hooklying x 10 min     05/29/23 UBE x 6 min fwd only level 1 - patient instructed to stop if pain occurred 3 way scapular stabilization with green loop (left) 4D ball rolls x 20 with light blue plyo ball Prone shoulder ext 2 x 10 with elbow flexed slightly 0# left Prone shoulder row (modified to protect biceps- avoided extending beyond neutral at starting point) 2 x 10 with 0# left Prone shoulder horizontal abduction with elbow slightly flexed 2 x 10 0# left Sidelying left shoulder ER with 0# 2 x 10 Supine serratus punch with active assist to get into position and to lower arm, then 2 x 10 with 0# Trigger Point Dry-Needling  Treatment instructions: Expect mild to moderate muscle soreness. S/S of pneumothorax if dry needled over a lung field, and to seek immediate medical attention should they occur. Patient verbalized understanding of these instructions and education. Patient Consent Given: Yes Education handout provided: Yes Muscles treated: anterior, middle and posterior deltoid Electrical stimulation performed: No Parameters: N/A Treatment response/outcome: Skilled palpation used to identify taut bands and trigger points.  Once identified, dry needling techniques used to treat these areas.  Twitch response ellicited along with palpable elongation of muscle.  Following treatment, Patient reported minimal change in soreness but "maybe slightly less tension".   Ice to right shoulder x 10 min   05/27/23 UBE x 4 min fwd only level 1 - patient instructed to go at a less aggressive pace and to do fwd only and to stop if pain occurred Seated pulleys with pool noodle behind him flexion and scaption x 2 min  ea Left Standing pulley for IR left shoulder x 2 min Attempted AAROM in supine but patient was having excessive anterior shoulder pain Standing flex, scaption, ABD x 10 ea in pain free range - cues for scap retraction Wall slides flexion x 10, Abd from 90 deg up x 10 Prone shoulder ext x 5 - stopped due to pain S/L ER x 10, then 2x10 with 3 sec hold at top S/L flexion x 10, ABD  Manual: IASTM with Graston tool to L ant shoulder, biceps and deltoids. STM to L deltoids. PROM in to ABD. Ice to right shoulder x 10 min   05/22/23 UBE x 4 min fwd only level 1 - patient instructed to go at a less aggressive pace and to do fwd only and to stop if pain occurred Seated pulleys with pool noodle behind him flexion and scaption x 2 min ea Left Standing pulley for IR left shoulder x 2 min Attempted AAROM in supine but patient was having excessive anterior shoulder pain PROM to right shoulder in supine  all planes of motion Ice to right shoulder x 10 min    05/15/2023 Reviewed HEP   PATIENT EDUCATION: Education details: Issued HEP Person educated: Patient Education method: Programmer, multimedia, Facilities manager, and Handouts Education comprehension: verbalized understanding and returned demonstration  HOME EXERCISE PROGRAM: Access Code: [email or web address] URL: [email or web address] Date: 05/15/2023 Prepared by: Clydie Braun Menke  Exercises - Seated Scapular Retraction  - 1 x daily - 7 x weekly - 2 sets - 10 reps - Circular Shoulder Pendulum with Table Support  - 1 x daily - 7 x weekly - 2 sets - 10 reps - Seated Shoulder Abduction Towel Slide at Table Top  - 1 x daily - 7 x weekly - 2 sets - 10 reps - Seated Shoulder Flexion Towel Slide at Table Top  - 1 x daily - 7 x weekly - 2 sets - 10 reps - Shoulder Flexion Wall Slide with Towel  - 1 x daily - 7 x weekly - 2 sets - 10 reps - Standing Shoulder Abduction Slides at Wall  - 1 x daily - 7 x weekly - 2 sets - 10 reps  ASSESSMENT:  CLINICAL  IMPRESSION: Toney Sang is progressing appropriately.  Very minimal end range discomfort.  Added 2 lb for shoulder exercises in prone and side lying today.  He did fatigue quite quickly and needed frequent rest breaks with prone horizontal abd and side lying ER.  He completed all other reps with no need to rest.  He is compliant and well motivated.   He should continue to improve.  He would benefit from continued skilled PT to meet final goals.    OBJECTIVE IMPAIRMENTS: decreased ROM, decreased strength, increased muscle spasms, impaired flexibility, postural dysfunction, and pain.   ACTIVITY LIMITATIONS: carrying, lifting, sleeping, reach over head, and caring for others  PARTICIPATION LIMITATIONS: community activity, occupation, and yard work  PERSONAL FACTORS: Profession and Time since onset of injury/illness/exacerbation are also affecting patient's functional outcome.   REHAB POTENTIAL: Good  CLINICAL DECISION MAKING: Stable/uncomplicated  EVALUATION COMPLEXITY: Low   GOALS: Goals reviewed with patient? Yes  SHORT TERM GOALS: Target date: 05/31/2023  Pt will be independent with initial HEP. Baseline: Goal status: MET  2.  Patient will increase left shoulder flexion and abduction A/ROM by 10 degrees to allow patient to reach into shelves. Baseline:  Goal status: MET 06/12/23   LONG TERM GOALS: Target date: 07/05/2023  Patient to be independent with advanced HEP to allow for self progression post discharge. Baseline:  Goal status: IN PROGRESS 06/05/23  2.  Patient to increase left shoulder A/ROM to [facility] to allow him to reach overhead for job related tasks. Baseline: see above Goal status: MET 06/05/23  3.  Patient to increase left shoulder strength to at least 4+/5 to allow him to push/pull items as needed at work and to assist with transfers. Baseline: 3-/5 Goal status: In progress  4.  Patient to increase FOTO to at least 62 to demonstrate improvements in functional  mobility. Baseline:  Goal status: In progress  5.  Patient able to demonstrate job simulated tasks in PT clinic without increased pain or to report ability to return to full duty at work without increased shoulder pain. Baseline:  Goal status: In progress    PLAN:  PT FREQUENCY: 1-2x/week  PT DURATION: 8 weeks  PLANNED INTERVENTIONS: Therapeutic exercises, Therapeutic activity, Neuromuscular re-education, Balance training, Gait training, Patient/Family education, Self Care, Joint mobilization, Joint manipulation, Aquatic Therapy, Dry Needling, Electrical stimulation, Spinal manipulation, Spinal mobilization, Cryotherapy,  Moist heat, scar mobilization, Taping, Vasopneumatic device, Ultrasound, Ionotophoresis 4mg /ml Dexamethasone, Manual therapy, and Re-evaluation  PLAN FOR NEXT SESSION: Post shoulder scope protocol with biceps tenodesis precautions.  Patient is now 7 weeks post op.     Victorino Dike B. Emiliano Welshans, PT 06/12/23 10:22 AM [facility] [address], [address] [place], [place] [address] Phone # [phone] Fax [phone]

## 2023-06-14 ENCOUNTER — Ambulatory Visit: Payer: Commercial Managed Care - PPO

## 2023-06-14 ENCOUNTER — Telehealth: Payer: Self-pay

## 2023-06-14 NOTE — Telephone Encounter (Signed)
Call placed to patient to remind him he missed his appointment this morning.  Patient answered and states he forgot.  He apologizes and will be at next appointment.  We reviewed our cancellation/no show policy.

## 2023-06-19 ENCOUNTER — Ambulatory Visit: Payer: PRIVATE HEALTH INSURANCE

## 2023-06-19 DIAGNOSIS — R252 Cramp and spasm: Secondary | ICD-10-CM

## 2023-06-19 DIAGNOSIS — M25512 Pain in left shoulder: Secondary | ICD-10-CM

## 2023-06-19 DIAGNOSIS — M6281 Muscle weakness (generalized): Secondary | ICD-10-CM

## 2023-06-19 NOTE — Therapy (Signed)
OUTPATIENT PHYSICAL THERAPY SHOULDER TREATMENT    Patient Name: Bruce Simmons MRN: [id number] DOB:[birth year], 47 y.o., male Today's Date: 06/19/2023  END OF SESSION:  PT End of Session - 06/19/23 1623     Visit Number 8    Number of Visits 18    Date for PT Re-Evaluation 07/05/23    Authorization Type cone aetna    Authorization - Visit Number 8    Authorization - Number of Visits 18    PT Start Time 1617    PT Stop Time 1650    PT Time Calculation (min) 33 min    Activity Tolerance Patient tolerated treatment well    Behavior During Therapy WFL for tasks assessed/performed               Past Medical History:  Diagnosis Date   Anxiety    GERD (gastroesophageal reflux disease)    Seasonal allergies    Sleep apnea    Sleep-related bruxism    Past Surgical History:  Procedure Laterality Date   SHOULDER ARTHROSCOPY WITH ROTATOR CUFF REPAIR AND SUBACROMIAL DECOMPRESSION Left 04/23/2023   Procedure: SHOULDER ARTHROSCOPY WITH ROTATOR CUFF DEBRIDEMENT VERSUS REPAIR  AND SUBACROMIAL DECOMPRESSION, OPEN BICEPS TENODESIS;  Surgeon: Jones Broom, MD;  Location: [facility];  Service: Orthopedics;  Laterality: Left;   Patient Active Problem List   Diagnosis Date Noted   OSA (obstructive sleep apnea) 11/11/2013    PCP: Rebecka Apley, NP  REFERRING PROVIDER: Jones Broom, MD  REFERRING DIAG: SHOULDER LONG HEAD BICEPS TENODESIS  THERAPY DIAG:  Acute pain of left shoulder  Muscle weakness (generalized)  Cramp and spasm  Rationale for Evaluation and Treatment: Rehabilitation  ONSET DATE: 04/23/2023 surgery date, actual injury occurred in April 2024  SUBJECTIVE:                                                                                                                                                                                      SUBJECTIVE STATEMENT: Pt is now 8 weeks post op.  He reports he still has little to no pain but  feels that his shoulder is weak.   EVAL: Pt initially injured his left shoulder at work in April 2024.  After failed conservative treatment, he had to undergo left shoulder arthroscopic debridement for partial rotator cuff tear, SLAP tear and subacromial bursa with left shoulder decompression and left shoulder subpectoral biceps tenodesis by Dr Ave Filter on 04/23/2023.  Hand dominance: Right  PERTINENT HISTORY: Left shoulder arthroscopic debridement for partial rotator cuff tear, SLAP tear and subacromial bursa with left shoulder decompression and left shoulder subpectoral biceps tenodesis by Dr Ave Filter on 04/23/2023 GERD,  sleep apnea with CPAP  PAIN:  Are you having pain? Yes: NPRS scale: in general 0-2 up to 10/10 Pain location: left shoulder Pain description: sore, but if he moves wrong can be a quick/sharp 10/10 pain Aggravating factors: certain movements Relieving factors: rest  PRECAUTIONS: Other: Per MD:  okay for full A/ROM and P/ROM of shoulder, no resisted elbow flexion or supination  RED FLAGS: None   WEIGHT BEARING RESTRICTIONS: No  FALLS:  Has patient fallen in last 6 months? No   OCCUPATION: Works for [employer].  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  NEXT MD VISIT: Dr Ave Filter in October  OBJECTIVE:   DIAGNOSTIC FINDINGS:  Left shoulder MRI on 03/02/2023 (prior to surgery): IMPRESSION: 1. Moderate tendinosis of the supraspinatus tendon with a small partial-thickness bursal surface tear along the anterior-most aspect and a  small insertional interstitial tears of the posterior most aspect. 2. Severe tendinosis of the infraspinatus tendon. 3. Mild tendinosis of the intra-articular portion of the long head of the biceps tendon.  PATIENT SURVEYS:  FOTO 46 (projected 64 by visit 19)  COGNITION: Overall cognitive status: Within functional limits for tasks assessed     SENSATION: Denies numbness or tingling  POSTURE: Rounded shoulders, forward head  UPPER EXTREMITY ROM:   Active ROM Right eval Left eval Left 05/27/23  Shoulder flexion 170 63 148 seated/145 supine   Shoulder extension 50 35 43  Shoulder abduction 165 56 62 seated/88 supine  Shoulder adduction     Shoulder internal rotation     Shoulder external rotation     (Blank rows = not tested)  UPPER EXTREMITY MMT:  Eval:   Right shoulder is WFL Left shoulder is at least 3- to 3/5    TODAY'S TREATMENT:     LEFT SHOULDER                                                                                                                                    DATE: 06/19/23 UBE x 5 min fwd only level 1  3 way scapular stabilization with blue loop (left) 4D ball rolls x 20 with yellow plyo ball Seated eccentric biceps lowering with 3 lbs Standing shoulder flexion and scaption with 1 lbs 2 x 10 Supine shoulder flexion with 0# x 10 then with 1# 2 x 10 PROM shoulder flexion x 10 with overpressure Side lying ER with 2# 2 x 10 Supine shoulder alphabet with 1# A-Z Prone shoulder ext 2 x 10  with 2# left Prone shoulder row  2 x 10 with 2# left Prone shoulder horizontal abduction 2 x 10 2# left Sidelying left shoulder ER with 2# 2 x 10 PROM all planes of motion  06/12/23 UBE x 5 min fwd only level 1  3 way scapular stabilization with green loop (left) 4D ball rolls x 20 with light blue plyo ball Seated eccentric biceps lowering with 2 lbs Standing shoulder flexion and scaption with 0 lbs 2 x 10 Prone shoulder ext 2 x 10 with 2# left Prone  shoulder row  2 x 10 with 2# left Prone shoulder horizontal abduction 2 x 10 2# left Sidelying left shoulder ER with 2# 2 x 10 Supine serratus punch 2 x 10 with 2# ROM re-checked: all have returned to [facility] (some minor end range discomfort most likely due to subacromial area still not quite calloused over Patient declined ice: needed to get back to work   06/05/23 UBE x 5 min fwd only level 1  3 way scapular stabilization with green loop (left) 4D ball rolls x 20 with light blue plyo ball Prone shoulder ext 2 x 10 with elbow flexed slightly 1# left Prone shoulder row (modified to protect biceps- avoided extending beyond neutral at starting point) 2 x 10 with 1# left Prone shoulder horizontal abduction with elbow slightly flexed 2 x 10 1# left Sidelying left shoulder ER with 1# 2 x 10 Supine serratus punch with active assist to get into position and to lower arm, then 2 x 10 with 1# PROM to left shoulder all planes of motion Ice to left shoulder in hooklying x 10 min      05/15/2023 Reviewed HEP   PATIENT EDUCATION: Education details: Issued HEP Person educated: Patient Education method: Programmer, multimedia, Facilities manager, and Handouts Education comprehension: verbalized understanding and returned demonstration  HOME EXERCISE PROGRAM: Access Code: [email or web address] URL: [email or web address] Date: 05/15/2023 Prepared by: Clydie Braun Menke  Exercises - Seated Scapular Retraction  - 1 x daily - 7 x weekly - 2 sets - 10 reps - Circular Shoulder Pendulum with Table Support  - 1 x daily - 7 x weekly - 2 sets - 10 reps - Seated Shoulder Abduction Towel Slide at Table Top  - 1 x daily - 7 x weekly - 2 sets - 10 reps - Seated Shoulder Flexion Towel Slide at Table Top  - 1 x daily - 7 x weekly - 2 sets - 10 reps - Shoulder Flexion Wall Slide with Towel  - 1 x daily - 7 x weekly - 2 sets - 10 reps - Standing Shoulder Abduction Slides at Wall  - 1 x daily - 7 x weekly - 2 sets - 10  reps  ASSESSMENT:  CLINICAL IMPRESSION: Bruce Simmons is making continued progress and with minimal pain.  He continues to fatigue quite easily, especially with side lying ER.    He would benefit from continued skilled PT to meet final goals.    OBJECTIVE IMPAIRMENTS: decreased ROM, decreased strength, increased muscle spasms, impaired flexibility, postural dysfunction, and pain.   ACTIVITY LIMITATIONS: carrying, lifting, sleeping, reach over head, and caring for others  PARTICIPATION LIMITATIONS: community activity, occupation, and yard work  PERSONAL FACTORS: Profession and Time since onset of injury/illness/exacerbation are also affecting patient's functional outcome.   REHAB POTENTIAL: Good  CLINICAL DECISION MAKING: Stable/uncomplicated  EVALUATION COMPLEXITY: Low   GOALS: Goals reviewed with patient? Yes  SHORT TERM GOALS: Target date: 05/31/2023  Pt will be independent with initial HEP. Baseline:  Goal status: MET  2.  Patient will increase left shoulder flexion and abduction A/ROM by 10 degrees to allow patient to reach into shelves. Baseline:  Goal status: MET 06/12/23   LONG TERM GOALS: Target date: 07/05/2023  Patient to be independent with advanced HEP to allow for self progression post discharge. Baseline:  Goal status: IN PROGRESS 06/05/23  2.  Patient to increase left shoulder A/ROM to [facility] to allow him to reach overhead for job related tasks. Baseline: see above Goal status: MET 06/05/23  3.  Patient to increase left shoulder strength to at least 4+/5 to allow him to push/pull items as needed at work and to assist with transfers. Baseline: 3-/5 Goal status: In progress  4.  Patient to increase FOTO to at least 62 to demonstrate improvements in functional mobility. Baseline:  Goal status: In progress  5.  Patient able to demonstrate job simulated tasks in PT clinic without increased pain or to report ability to return to full duty at work without increased  shoulder pain. Baseline:  Goal status: In progress    PLAN:  PT FREQUENCY: 1-2x/week  PT DURATION: 8 weeks  PLANNED INTERVENTIONS: Therapeutic exercises, Therapeutic activity, Neuromuscular re-education, Balance training, Gait training, Patient/Family education, Self Care, Joint mobilization, Joint manipulation, Aquatic Therapy, Dry Needling, Electrical stimulation, Spinal manipulation, Spinal mobilization, Cryotherapy, Moist heat, scar mobilization, Taping, Vasopneumatic device, Ultrasound, Ionotophoresis 4mg /ml Dexamethasone, Manual therapy, and Re-evaluation  PLAN FOR NEXT SESSION: Post shoulder scope protocol with biceps tenodesis precautions.  Patient is now 8 weeks post op.     Victorino Dike B. Khriz Liddy, PT 06/19/23 9:58 PM [facility] [address] [place], [place] [address] Phone # [phone] Fax [phone]

## 2023-06-21 ENCOUNTER — Ambulatory Visit: Payer: PRIVATE HEALTH INSURANCE

## 2023-06-21 DIAGNOSIS — M25512 Pain in left shoulder: Secondary | ICD-10-CM

## 2023-06-21 DIAGNOSIS — R262 Difficulty in walking, not elsewhere classified: Secondary | ICD-10-CM

## 2023-06-21 DIAGNOSIS — M6281 Muscle weakness (generalized): Secondary | ICD-10-CM

## 2023-06-21 DIAGNOSIS — R252 Cramp and spasm: Secondary | ICD-10-CM

## 2023-06-21 DIAGNOSIS — M62838 Other muscle spasm: Secondary | ICD-10-CM

## 2023-06-21 NOTE — Therapy (Signed)
OUTPATIENT PHYSICAL THERAPY SHOULDER TREATMENT    Patient Name: Bruce Simmons MRN: [id number] DOB:1976/05/10, 47 y.o., male Today's Date: 06/21/2023  END OF SESSION:  PT End of Session - 06/21/23 0936     Visit Number 9    Number of Visits 18    Date for PT Re-Evaluation 07/05/23    Authorization Type cone aetna    Authorization - Number of Visits 18    PT Start Time 0932    PT Stop Time 1011    PT Time Calculation (min) 39 min    Activity Tolerance Patient tolerated treatment well    Behavior During Therapy WFL for tasks assessed/performed               Past Medical History:  Diagnosis Date   Anxiety    GERD (gastroesophageal reflux disease)    Seasonal allergies    Sleep apnea    Sleep-related bruxism    Past Surgical History:  Procedure Laterality Date   SHOULDER ARTHROSCOPY WITH ROTATOR CUFF REPAIR AND SUBACROMIAL DECOMPRESSION Left 04/23/2023   Procedure: SHOULDER ARTHROSCOPY WITH ROTATOR CUFF DEBRIDEMENT VERSUS REPAIR  AND SUBACROMIAL DECOMPRESSION, OPEN BICEPS TENODESIS;  Surgeon: Jones Broom, MD;  Location: [facility];  Service: Orthopedics;  Laterality: Left;   Patient Active Problem List   Diagnosis Date Noted   OSA (obstructive sleep apnea) 11/11/2013    PCP: Rebecka Apley, NP  REFERRING PROVIDER: Jones Broom, MD  REFERRING DIAG: SHOULDER LONG HEAD BICEPS TENODESIS  THERAPY DIAG:  Cramp and spasm  Difficulty in walking, not elsewhere classified  Muscle weakness (generalized)  Acute pain of left shoulder  Other muscle spasm  Rationale for Evaluation and Treatment: Rehabilitation  ONSET DATE: 04/23/2023 surgery date, actual injury occurred in April 2024  SUBJECTIVE:                                                                                                                                                                                      SUBJECTIVE STATEMENT: No changes from last visit.  Still no  pain.  Not having to take any medication for pain.    EVAL: Pt initially injured his left shoulder at work in April 2024.  After failed conservative treatment, he had to undergo left shoulder arthroscopic debridement for partial rotator cuff tear, SLAP tear and subacromial bursa with left shoulder decompression and left shoulder subpectoral biceps tenodesis by Dr Ave Filter on 04/23/2023.  Hand dominance: Right  PERTINENT HISTORY: Left shoulder arthroscopic debridement for partial rotator cuff tear, SLAP tear and subacromial bursa with left shoulder decompression and left shoulder subpectoral biceps tenodesis by Dr Ave Filter on 04/23/2023 GERD, sleep apnea  with CPAP  PAIN:  Are you having pain? Yes: NPRS scale: in general 0-2 up to 10/10 Pain location: left shoulder Pain description: sore, but if he moves wrong can be a quick/sharp 10/10 pain Aggravating factors: certain movements Relieving factors: rest  PRECAUTIONS: Other: Per MD:  okay for full A/ROM and P/ROM of shoulder, no resisted elbow flexion or supination  RED FLAGS: None   WEIGHT BEARING RESTRICTIONS: No  FALLS:  Has patient fallen in last 6 months? No   OCCUPATION: Works for [employer].  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  NEXT MD VISIT: Dr Ave Filter in October  OBJECTIVE:   DIAGNOSTIC FINDINGS:  Left shoulder MRI on 03/02/2023 (prior to surgery): IMPRESSION: 1. Moderate tendinosis of the supraspinatus tendon with a small partial-thickness bursal surface tear along the  anterior-most aspect and a small insertional interstitial tears of the posterior most aspect. 2. Severe tendinosis of the infraspinatus tendon. 3. Mild tendinosis of the intra-articular portion of the long head of the biceps tendon.  PATIENT SURVEYS:  FOTO 46 (projected 59 by visit 19)  COGNITION: Overall cognitive status: Within functional limits for tasks assessed     SENSATION: Denies numbness or tingling  POSTURE: Rounded shoulders, forward head  UPPER EXTREMITY ROM:   Active ROM Right eval Left eval Left 05/27/23  Shoulder flexion 170 63 148 seated/145 supine   Shoulder extension 50 35 43  Shoulder abduction 165 56 62 seated/88 supine  Shoulder adduction     Shoulder internal rotation     Shoulder external rotation     (Blank rows = not tested)  UPPER EXTREMITY MMT:  Eval:   Right shoulder is WFL Left shoulder is at least 3- to 3/5    TODAY'S TREATMENT:     LEFT SHOULDER                                                                                                                                    DATE: 06/21/23 UBE x 5 min fwd only level 1  3 way scapular stabilization with blue loop (left) 4D ball rolls x 20 with yellow plyo ball Seated eccentric biceps lowering with 4 lbs Wall clocks with blue plyo ball x 5 (12 to 6 and back equals 1 rep) Standing shoulder flexion and scaption with 1 lbs 2 x 10 Side lying ER with 2# 2 x 10 Supine shoulder alphabet with 2# A-Z Prone shoulder ext 2 x 10 with 2# left Prone shoulder row  2 x 10 with 2# left Prone shoulder horizontal abduction 2 x 10 2# left Sidelying left shoulder ER with 2# 2 x 10 PROM all planes of motion  06/19/23 UBE x 5 min fwd only level 1  3 way scapular stabilization with blue loop (left) 4D ball rolls x 20 with yellow plyo ball Seated eccentric biceps lowering with 3 lbs Standing shoulder flexion and scaption with 1 lbs 2 x 10 Supine shoulder flexion with 0# x 10 then with 1# 2 x 10 PROM  shoulder flexion x 10 with overpressure Side lying ER with 2# 2 x 10 Supine shoulder alphabet with 1# A-Z Prone shoulder ext 2 x 10 with 2# left Prone shoulder row  2 x 10 with 2# left Prone shoulder horizontal abduction 2 x 10 2# left Sidelying left shoulder ER with 2# 2 x 10 PROM all planes of motion  06/12/23 UBE x 5 min fwd only level 1  3 way scapular stabilization with green loop (left) 4D ball rolls x 20 with light blue plyo ball Seated eccentric biceps lowering with 2 lbs Standing shoulder flexion and scaption with 0 lbs 2 x 10 Prone shoulder ext 2 x 10 with 2# left Prone shoulder row  2 x 10 with 2# left Prone shoulder horizontal abduction 2 x 10 2# left Sidelying left shoulder ER with 2# 2 x 10 Supine serratus punch 2 x 10 with 2# ROM re-checked: all have returned to [facility] (some minor end range discomfort most likely due to subacromial area still not quite calloused over Patient declined ice: needed to get back to work   05/15/2023 Reviewed HEP   PATIENT EDUCATION: Education details: Issued HEP Person educated: Patient Education method: Programmer, multimedia, Facilities manager, and Handouts Education comprehension: verbalized understanding and returned demonstration  HOME EXERCISE PROGRAM: Access Code: [email or web address] URL: [email or web address] Date: 05/15/2023 Prepared by: Clydie Braun Menke  Exercises - Seated Scapular Retraction  - 1 x daily - 7 x weekly - 2 sets - 10 reps - Circular Shoulder Pendulum with Table Support  - 1 x daily - 7 x weekly - 2 sets - 10 reps - Seated Shoulder Abduction Towel Slide at Table Top  - 1 x daily - 7 x weekly - 2 sets - 10 reps - Seated Shoulder Flexion Towel Slide at Table Top  - 1 x daily - 7 x weekly - 2 sets - 10 reps - Shoulder Flexion Wall Slide with Towel  - 1 x daily - 7 x weekly - 2 sets - 10 reps - Standing Shoulder Abduction Slides at Wall  - 1 x daily - 7 x weekly - 2 sets - 10 reps  ASSESSMENT:  CLINICAL IMPRESSION: Toney Sang  was able to tolerate increased resistance and addition of wall clocks today but did fatigue quickly with wall clocks.   He would benefit from continued skilled PT to meet final goals.    OBJECTIVE IMPAIRMENTS: decreased ROM, decreased strength, increased muscle spasms, impaired flexibility, postural dysfunction, and pain.   ACTIVITY LIMITATIONS: carrying, lifting, sleeping, reach over head, and caring for others  PARTICIPATION LIMITATIONS: community activity, occupation, and yard work  PERSONAL FACTORS: Profession and Time since onset of injury/illness/exacerbation are also affecting patient's functional outcome.   REHAB POTENTIAL: Good  CLINICAL DECISION MAKING: Stable/uncomplicated  EVALUATION COMPLEXITY: Low   GOALS: Goals reviewed with patient? Yes  SHORT TERM GOALS: Target date: 05/31/2023  Pt will be independent with initial HEP. Baseline: Goal status: MET  2.  Patient will increase left shoulder  flexion and abduction A/ROM by 10 degrees to allow patient to reach into shelves. Baseline:  Goal status: MET 06/12/23   LONG TERM GOALS: Target date: 07/05/2023  Patient to be independent with advanced HEP to allow for self progression post discharge. Baseline:  Goal status: IN PROGRESS 06/05/23  2.  Patient to increase left shoulder A/ROM to [facility] to allow him to reach overhead for job related tasks. Baseline: see above Goal status: MET 06/05/23  3.  Patient to increase left shoulder strength to at least 4+/5 to allow him to push/pull items as needed at work and to assist with transfers. Baseline: 3-/5 Goal status: In progress  4.  Patient to increase FOTO to at least 62 to demonstrate improvements in functional mobility. Baseline:  Goal status: In progress  5.  Patient able to demonstrate job simulated tasks in PT clinic without increased pain or to report ability to return to full duty at work without increased shoulder pain. Baseline:  Goal status: In  progress    PLAN:  PT FREQUENCY: 1-2x/week  PT DURATION: 8 weeks  PLANNED INTERVENTIONS: Therapeutic exercises, Therapeutic activity, Neuromuscular re-education, Balance training, Gait training, Patient/Family education, Self Care, Joint mobilization, Joint manipulation, Aquatic Therapy, Dry Needling, Electrical stimulation, Spinal manipulation, Spinal mobilization, Cryotherapy, Moist heat, scar mobilization, Taping, Vasopneumatic device, Ultrasound, Ionotophoresis 4mg /ml Dexamethasone, Manual therapy, and Re-evaluation  PLAN FOR NEXT SESSION: Post shoulder scope protocol with biceps tenodesis precautions.  Patient is now 8 weeks post op.     Victorino Dike B. Durrell Barajas, PT 06/21/23 10:12 AM [facility] [address] [place], [place] [address] Phone # [phone] Fax [phone]

## 2023-06-25 ENCOUNTER — Ambulatory Visit: Payer: PRIVATE HEALTH INSURANCE

## 2023-06-25 DIAGNOSIS — R252 Cramp and spasm: Secondary | ICD-10-CM

## 2023-06-25 DIAGNOSIS — M25512 Pain in left shoulder: Secondary | ICD-10-CM

## 2023-06-25 DIAGNOSIS — M62838 Other muscle spasm: Secondary | ICD-10-CM

## 2023-06-25 DIAGNOSIS — M6281 Muscle weakness (generalized): Secondary | ICD-10-CM

## 2023-06-25 DIAGNOSIS — R262 Difficulty in walking, not elsewhere classified: Secondary | ICD-10-CM

## 2023-06-25 DIAGNOSIS — R293 Abnormal posture: Secondary | ICD-10-CM

## 2023-06-25 NOTE — Therapy (Signed)
OUTPATIENT PHYSICAL THERAPY SHOULDER TREATMENT    Patient Name: Bruce Simmons MRN: [id number] DOB:1975-12-08, 47 y.o., male Today's Date: 06/25/2023  END OF SESSION:  PT End of Session - 06/25/23 0942     Visit Number 10    Number of Visits 18    Date for PT Re-Evaluation 07/05/23    Authorization Type cone aetna    Authorization - Number of Visits 18    Progress Note Due on Visit 17    PT Start Time 0940    PT Stop Time 1015    PT Time Calculation (min) 35 min    Activity Tolerance Patient tolerated treatment well    Behavior During Therapy WFL for tasks assessed/performed               Past Medical History:  Diagnosis Date   Anxiety    GERD (gastroesophageal reflux disease)    Seasonal allergies    Sleep apnea    Sleep-related bruxism    Past Surgical History:  Procedure Laterality Date   SHOULDER ARTHROSCOPY WITH ROTATOR CUFF REPAIR AND SUBACROMIAL DECOMPRESSION Left 04/23/2023   Procedure: SHOULDER ARTHROSCOPY WITH ROTATOR CUFF DEBRIDEMENT VERSUS REPAIR  AND SUBACROMIAL DECOMPRESSION, OPEN BICEPS TENODESIS;  Surgeon: Jones Broom, MD;  Location: [facility];  Service: Orthopedics;  Laterality: Left;   Patient Active Problem List   Diagnosis Date Noted   OSA (obstructive sleep apnea) 11/11/2013    PCP: Rebecka Apley, NP  REFERRING PROVIDER: Jones Broom, MD  REFERRING DIAG: SHOULDER LONG HEAD BICEPS TENODESIS  THERAPY DIAG:  Cramp and spasm  Difficulty in walking, not elsewhere classified  Muscle weakness (generalized)  Acute pain of left shoulder  Other muscle spasm  Abnormal posture  Rationale for Evaluation and Treatment: Rehabilitation  ONSET DATE: 04/23/2023 surgery date, actual injury occurred in April 2024  SUBJECTIVE:                                                                                                                                                                                       SUBJECTIVE STATEMENT: Patient reports he is still doing well.  No pain, just weak.    EVAL: Pt initially injured his left shoulder at work in April 2024.  After failed conservative treatment, he had to undergo left shoulder arthroscopic debridement for partial rotator cuff tear, SLAP tear and subacromial bursa with left shoulder decompression and left shoulder subpectoral biceps tenodesis by Dr Ave Filter on 04/23/2023.  Hand dominance: Right  PERTINENT HISTORY: Left shoulder arthroscopic debridement for partial rotator cuff tear, SLAP tear and subacromial bursa with left shoulder decompression and left shoulder subpectoral biceps tenodesis by Dr  Chandler on 04/23/2023 GERD, sleep apnea with CPAP  PAIN:  Are you having pain? Yes: NPRS scale: in general 0-2 up to 10/10 Pain location: left shoulder Pain description: sore, but if he moves wrong can be a quick/sharp 10/10 pain Aggravating factors: certain movements Relieving factors: rest  PRECAUTIONS: Other: Per MD:  okay for full A/ROM and P/ROM of shoulder, no resisted elbow flexion or supination  RED FLAGS: None   WEIGHT BEARING RESTRICTIONS: No  FALLS:  Has patient fallen in last 6 months? No   OCCUPATION: Works for [employer].  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  NEXT MD VISIT: Dr Ave Filter in October  OBJECTIVE:   DIAGNOSTIC FINDINGS:  Left shoulder MRI on 03/02/2023 (prior to surgery): IMPRESSION: 1. Moderate tendinosis of the supraspinatus tendon with a small partial-thickness  bursal surface tear along the anterior-most aspect and a small insertional interstitial tears of the posterior most aspect. 2. Severe tendinosis of the infraspinatus tendon. 3. Mild tendinosis of the intra-articular portion of the long head of the biceps tendon.  PATIENT SURVEYS:  FOTO 46 (projected 62 by visit 19)  06/25/23:   FOTO 57 (projected 62 by visit 19)  COGNITION: Overall cognitive status: Within functional limits for tasks assessed     SENSATION: Denies numbness or tingling  POSTURE: Rounded shoulders, forward head  UPPER EXTREMITY ROM:   Active ROM Right eval Left eval Left 05/27/23 Left 06/25/23  Shoulder flexion 170 63 148 seated/145 supine  148 active 155 passive  Shoulder extension 50 35 43 na  Shoulder abduction 165 56 62 seated/88 supine 158 active 170 passive  Shoulder adduction      Shoulder internal rotation    68 active 70 passive  Shoulder external rotation    73 active 80 passive  (Blank rows = not tested)  UPPER EXTREMITY MMT:  Eval:   Right shoulder is WFL Left shoulder is at least 3- to 3/5    TODAY'S TREATMENT:     LEFT SHOULDER                                                                                                                                    DATE: 06/25/23 (patient 10 min late for appt) UBE x 6 min (3/3) Re-assessment and FOTO completed 3 way scapular stabilization with blue loop (left) 4D ball rolls x 20 with yellow plyo ball Wall clocks with blue plyo ball x 5 (12 to 6 and back equals 1 rep) Seated  eccentric biceps lowering with 4 lbs x 20 Standing shoulder flexion and scaption with 1 lbs 2 x 10 Prone shoulder ext 2 x 10 with 2# left Prone shoulder row  2 x 10 with 2# left Prone shoulder horizontal abduction 2 x 10 2# left Side lying ER with 2# 2 x 10 Supine shoulder alphabet with 2# A-Z PROM all planes of motion  06/21/23 UBE x 5 min fwd only level 1  3 way scapular stabilization with blue loop (left) 4D  ball rolls x 20 with yellow plyo ball Seated eccentric biceps lowering with 4 lbs Wall clocks with blue plyo ball x 5 (12 to 6 and back equals 1 rep) Standing shoulder flexion and scaption with 1 lbs 2 x 10 Side lying ER with 2# 2 x 10 Supine shoulder alphabet with 2# A-Z Prone shoulder ext 2 x 10 with 2# left Prone shoulder row  2 x 10 with 2# left Prone shoulder horizontal abduction 2 x 10 2# left Sidelying left shoulder ER with 2# 2 x 10 PROM all planes of motion  06/19/23 UBE x 5 min fwd only level 1  3 way scapular stabilization with blue loop (left) 4D ball rolls x 20 with yellow plyo ball Seated eccentric biceps lowering with 3 lbs Standing shoulder flexion and scaption with 1 lbs 2 x 10 Supine shoulder flexion with 0# x 10 then with 1# 2 x 10 PROM shoulder flexion x 10 with overpressure Side lying ER with 2# 2 x 10 Supine shoulder alphabet with 1# A-Z Prone shoulder ext 2 x 10 with 2# left Prone shoulder row  2 x 10 with 2# left Prone shoulder horizontal abduction 2 x 10 2# left Sidelying left shoulder ER with 2# 2 x 10 PROM all planes of motion  05/15/2023 Reviewed HEP   PATIENT EDUCATION: Education details: Issued HEP Person educated: Patient Education method: Programmer, multimedia, Facilities manager, and Handouts Education comprehension: verbalized understanding and returned demonstration  HOME EXERCISE PROGRAM: Access Code: [email or web address] URL: [email or web address] Date: 05/15/2023 Prepared by: Clydie Braun Menke  Exercises - Seated Scapular Retraction  - 1 x daily - 7 x weekly - 2 sets - 10 reps - Circular Shoulder Pendulum with Table Support  - 1 x daily - 7 x weekly - 2 sets - 10 reps - Seated Shoulder Abduction Towel Slide at Table Top  - 1 x daily - 7 x weekly - 2 sets - 10 reps - Seated Shoulder Flexion Towel Slide at Table Top  - 1 x daily - 7 x weekly - 2 sets - 10 reps - Shoulder Flexion Wall Slide with Towel  - 1 x daily - 7 x weekly - 2 sets - 10 reps -  Standing Shoulder Abduction Slides at Wall  - 1 x daily - 7 x weekly - 2 sets - 10 reps  ASSESSMENT:  CLINICAL IMPRESSION: Toney Sang was 10 min late for appt.  He continues to tolerate strengthening with ease with exception of wall clocks which he struggled with today.  He became weak and had difficulty at about 90 degrees coming back up in the range of 8-10.  He continues to struggle with side lying ER as well.  We decreased his resistance to 0# in order for him to obtain full ER motion for strengthening.  With the weight he could only manage 0-30 degrees.  He is moderately compliant and should continue to improve.  He would benefit from continuing skilled PT for post op protocol.    OBJECTIVE  IMPAIRMENTS: decreased ROM, decreased strength, increased muscle spasms, impaired flexibility, postural dysfunction, and pain.   ACTIVITY LIMITATIONS: carrying, lifting, sleeping, reach over head, and caring for others  PARTICIPATION LIMITATIONS: community activity, occupation, and yard work  PERSONAL FACTORS: Profession and Time since onset of injury/illness/exacerbation are also affecting patient's functional outcome.   REHAB POTENTIAL: Good  CLINICAL DECISION MAKING: Stable/uncomplicated  EVALUATION COMPLEXITY: Low   GOALS: Goals reviewed with patient? Yes  SHORT TERM GOALS: Target date: 05/31/2023  Pt will be independent with initial HEP. Baseline: Goal status: MET  2.  Patient will increase left shoulder flexion and abduction A/ROM by 10 degrees to allow patient to reach into shelves. Baseline:  Goal status: MET 06/12/23   LONG TERM GOALS: Target date: 07/05/2023  Patient to be independent with advanced HEP to allow for self progression post discharge. Baseline:  Goal status: IN PROGRESS 06/05/23  2.  Patient to increase left shoulder A/ROM to [facility] to allow him to reach overhead for job related tasks. Baseline: see above Goal status: MET 06/05/23  3.  Patient to increase left shoulder  strength to at least 4+/5 to allow him to push/pull items as needed at work and to assist with transfers. Baseline: 3-/5 Goal status: In progress  4.  Patient to increase FOTO to at least 62 to demonstrate improvements in functional mobility. Baseline:  Goal status: In progress  5.  Patient able to demonstrate job simulated tasks in PT clinic without increased pain or to report ability to return to full duty at work without increased shoulder pain. Baseline:  Goal status: In progress    PLAN:  PT FREQUENCY: 1-2x/week  PT DURATION: 8 weeks  PLANNED INTERVENTIONS: Therapeutic exercises, Therapeutic activity, Neuromuscular re-education, Balance training, Gait training, Patient/Family education, Self Care, Joint mobilization, Joint manipulation, Aquatic Therapy, Dry Needling, Electrical stimulation, Spinal manipulation, Spinal mobilization, Cryotherapy, Moist heat, scar mobilization, Taping, Vasopneumatic device, Ultrasound, Ionotophoresis 4mg /ml Dexamethasone, Manual therapy, and Re-evaluation  PLAN FOR NEXT SESSION: ER strengthening, stability training at around 60-75 degrees of abduction.    Victorino Dike B. Edelyn Heidel, PT 06/25/23 2:02 PM [facility] [address], [address] [place], [place] [address] Phone # [phone] Fax [phone]

## 2023-06-28 NOTE — Therapy (Signed)
OUTPATIENT PHYSICAL THERAPY SHOULDER TREATMENT    Patient Name: Bruce Simmons MRN: [id number] DOB:1976-02-15, 47 y.o., male Today's Date: 07/01/2023  END OF SESSION:  PT End of Session - 07/01/23 0810     Visit Number 11    Number of Visits 18    Date for PT Re-Evaluation 07/05/23    Authorization Type cone aetna    Authorization - Visit Number 9    Authorization - Number of Visits 18    Progress Note Due on Visit 17    PT Start Time 0810    PT Stop Time 0844    PT Time Calculation (min) 34 min    Activity Tolerance Patient tolerated treatment well    Behavior During Therapy WFL for tasks assessed/performed                Past Medical History:  Diagnosis Date   Anxiety    GERD (gastroesophageal reflux disease)    Seasonal allergies    Sleep apnea    Sleep-related bruxism    Past Surgical History:  Procedure Laterality Date   SHOULDER ARTHROSCOPY WITH ROTATOR CUFF REPAIR AND SUBACROMIAL DECOMPRESSION Left 04/23/2023   Procedure: SHOULDER ARTHROSCOPY WITH ROTATOR CUFF DEBRIDEMENT VERSUS REPAIR  AND SUBACROMIAL DECOMPRESSION, OPEN BICEPS TENODESIS;  Surgeon: Jones Broom, MD;  Location: [facility];  Service: Orthopedics;  Laterality: Left;   Patient Active Problem List   Diagnosis Date Noted   OSA (obstructive sleep apnea) 11/11/2013    PCP: Rebecka Apley, NP  REFERRING PROVIDER: Jones Broom, MD  REFERRING DIAG: SHOULDER LONG HEAD BICEPS TENODESIS  THERAPY DIAG:  Cramp and spasm  Difficulty in walking, not elsewhere classified  Muscle weakness (generalized)  Acute pain of left shoulder  Rationale for Evaluation and Treatment: Rehabilitation  ONSET DATE: 04/23/2023 surgery date, actual injury occurred in April 2024  SUBJECTIVE:                                                                                                                                                                                      SUBJECTIVE  STATEMENT: It just gets tired   EVAL: Pt initially injured his left shoulder at work in April 2024.  After failed conservative treatment, he had to undergo left shoulder arthroscopic debridement for partial rotator cuff tear, SLAP tear and subacromial bursa with left shoulder decompression and left shoulder subpectoral biceps tenodesis by Dr Ave Filter on 04/23/2023.  Hand dominance: Right  PERTINENT HISTORY: Left shoulder arthroscopic debridement for partial rotator cuff tear, SLAP tear and subacromial bursa with left shoulder decompression and left shoulder subpectoral biceps tenodesis by Dr Ave Filter on 04/23/2023 GERD, sleep apnea with  CPAP  PAIN:  Are you having pain? Yes: NPRS scale: in general 0-2 up to 10/10 Pain location: left shoulder Pain description: sore, but if he moves wrong can be a quick/sharp 10/10 pain Aggravating factors: certain movements Relieving factors: rest  PRECAUTIONS: Other: Per MD:  okay for full A/ROM and P/ROM of shoulder, no resisted elbow flexion or supination  RED FLAGS: None   WEIGHT BEARING RESTRICTIONS: No  FALLS:  Has patient fallen in last 6 months? No   OCCUPATION: Works for [employer].  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  NEXT MD VISIT: Dr Ave Filter in November  OBJECTIVE:   DIAGNOSTIC FINDINGS:  Left shoulder MRI on 03/02/2023 (prior to surgery): IMPRESSION: 1. Moderate tendinosis of the supraspinatus tendon with a small partial-thickness bursal surface tear along the anterior-most aspect and a  small insertional interstitial tears of the posterior most aspect. 2. Severe tendinosis of the infraspinatus tendon. 3. Mild tendinosis of the intra-articular portion of the long head of the biceps tendon.  PATIENT SURVEYS:  FOTO 46 (projected 62 by visit 19)  06/25/23:   FOTO 57 (projected 62 by visit 19)  COGNITION: Overall cognitive status: Within functional limits for tasks assessed     SENSATION: Denies numbness or tingling  POSTURE: Rounded shoulders, forward head  UPPER EXTREMITY ROM:   Active ROM Right eval Left eval Left 05/27/23 Left 06/25/23  Shoulder flexion 170 63 148 seated/145 supine  148 active 155 passive  Shoulder extension 50 35 43 na  Shoulder abduction 165 56 62 seated/88 supine 158 active 170 passive  Shoulder adduction      Shoulder internal rotation    68 active 70 passive  Shoulder external rotation    73 active 80 passive  (Blank rows = not tested)  UPPER EXTREMITY MMT:  Eval:   Right shoulder is WFL Left shoulder is at least 3- to 3/5    TODAY'S TREATMENT:     LEFT SHOULDER                                                                                                                                    DATE: 07/01/23 UBE L 2 x 6 min (3/3) 3 way scapular stabilization with blue loop (left) 4D ball rolls x 20 with yellow plyo ball Wall clocks with blue plyo ball x 10 (12 to 6 and back equals 1 rep) Seated eccentric biceps lowering with 4 lbs x 20 Standing shoulder flexion and scaption with 2  lbs 2 x 10 Prone shoulder ext 2 x 10 with 2# left Prone shoulder row  2 x 10 with 2# left Prone shoulder horizontal abduction 2 x 10 2# left Side lying ER with 1# 2 x 10 Supine shoulder alphabet with 2# A-Z PROM all planes of motion  06/25/23 (patient 10 min late for appt) UBE x 6 min (3/3) Re-assessment and FOTO completed 3 way scapular stabilization with blue loop (left) 4D ball rolls x 20 with yellow plyo ball Wall clocks with blue  plyo ball x 5 (12 to 6 and back equals 1 rep) Seated eccentric biceps lowering with 4 lbs x 20 Standing shoulder flexion and scaption with 1 lbs 2 x 10 Prone shoulder ext 2 x 10 with 2# left Prone shoulder row  2 x 10 with 2# left Prone shoulder horizontal abduction 2 x 10 2# left Side lying ER with 2# 2 x 10 Supine shoulder alphabet with 2# A-Z PROM all planes of motion  06/21/23 UBE x 5 min fwd only level 1  3 way scapular stabilization with blue loop (left) 4D ball rolls x 20 with yellow plyo ball Seated eccentric biceps lowering with 4 lbs Wall clocks with blue plyo ball x 5 (12 to 6 and back equals 1 rep) Standing shoulder flexion and scaption with 1 lbs 2 x 10 Side lying ER with 2# 2 x 10 Supine shoulder alphabet with 2# A-Z Prone shoulder ext 2 x 10 with 2# left Prone shoulder row  2 x 10 with 2# left Prone shoulder horizontal abduction 2 x 10 2# left Sidelying left shoulder ER with 2# 2 x 10 PROM all planes of motion  PATIENT EDUCATION: Education details: Issued HEP Person educated: Patient Education method: Explanation, Demonstration, and Handouts Education comprehension: verbalized understanding and returned demonstration  HOME EXERCISE PROGRAM: Access Code: [email or web address] URL: [email or web address] Date: 05/15/2023 Prepared by: Clydie Braun Menke  Exercises - Seated Scapular Retraction  - 1 x daily - 7 x weekly - 2 sets - 10 reps - Circular Shoulder Pendulum with Table Support  - 1 x daily - 7 x weekly - 2 sets - 10 reps - Seated Shoulder Abduction Towel Slide at Table Top  - 1 x daily - 7 x weekly - 2 sets - 10 reps - Seated Shoulder Flexion Towel Slide at Table Top  - 1 x daily - 7 x weekly - 2 sets - 10 reps - Shoulder Flexion Wall Slide with Towel  - 1 x daily - 7 x weekly - 2 sets - 10 reps - Standing Shoulder Abduction Slides at Wall  - 1 x daily - 7 x weekly - 2 sets - 10 reps  ASSESSMENT:  CLINICAL IMPRESSION: Toney Sang was 10 min late for appt. He  demonstrated improved strength with wall clock's today. He took two short rests, but form was good. Able to get full range on ER with 1# weight today, but still fatigues fairly quickly. He continues to demonstrate potential for improvement and would benefit from continued skilled therapy to address impairments.     OBJECTIVE IMPAIRMENTS: decreased ROM, decreased strength, increased muscle spasms, impaired flexibility, postural dysfunction, and pain.   ACTIVITY LIMITATIONS: carrying, lifting, sleeping, reach over head, and caring for others  PARTICIPATION LIMITATIONS: community activity, occupation, and yard work  PERSONAL FACTORS: Profession and Time since onset of injury/illness/exacerbation are also affecting patient's functional outcome.   REHAB POTENTIAL: Good  CLINICAL DECISION MAKING: Stable/uncomplicated  EVALUATION COMPLEXITY: Low   GOALS: Goals reviewed  with patient? Yes  SHORT TERM GOALS: Target date: 05/31/2023  Pt will be independent with initial HEP. Baseline: Goal status: MET  2.  Patient will increase left shoulder flexion and abduction A/ROM by 10 degrees to allow patient to reach into shelves. Baseline:  Goal status: MET 06/12/23   LONG TERM GOALS: Target date: 07/05/2023  Patient to be independent with advanced HEP to allow for self progression post discharge. Baseline:  Goal status: IN PROGRESS 06/05/23  2.  Patient to increase left shoulder A/ROM to [facility] to allow him to reach overhead for job related tasks. Baseline: see above Goal status: MET 06/05/23  3.  Patient to increase left shoulder strength to at least 4+/5 to allow him to push/pull items as needed at work and to assist with transfers. Baseline: 3-/5 Goal status: In progress  4.  Patient to increase FOTO to at least 62 to demonstrate improvements in functional mobility. Baseline:  Goal status: In progress  5.  Patient able to demonstrate job simulated tasks in PT clinic without increased pain  or to report ability to return to full duty at work without increased shoulder pain. Baseline:  Goal status: In progress    PLAN:  PT FREQUENCY: 1-2x/week  PT DURATION: 8 weeks  PLANNED INTERVENTIONS: Therapeutic exercises, Therapeutic activity, Neuromuscular re-education, Balance training, Gait training, Patient/Family education, Self Care, Joint mobilization, Joint manipulation, Aquatic Therapy, Dry Needling, Electrical stimulation, Spinal manipulation, Spinal mobilization, Cryotherapy, Moist heat, scar mobilization, Taping, Vasopneumatic device, Ultrasound, Ionotophoresis 4mg /ml Dexamethasone, Manual therapy, and Re-evaluation  PLAN FOR NEXT SESSION: ER strengthening, stability training at around 60-75 degrees of abduction.    Victorino Dike B. Fields, PT 07/01/23 8:45 AM [facility] [address], [address] [place], [place] [address] Phone # [phone] Fax [phone]

## 2023-07-01 ENCOUNTER — Ambulatory Visit: Payer: PRIVATE HEALTH INSURANCE | Attending: Orthopedic Surgery | Admitting: Physical Therapy

## 2023-07-01 ENCOUNTER — Encounter: Payer: Self-pay | Admitting: Physical Therapy

## 2023-07-01 DIAGNOSIS — M25512 Pain in left shoulder: Secondary | ICD-10-CM | POA: Insufficient documentation

## 2023-07-01 DIAGNOSIS — R262 Difficulty in walking, not elsewhere classified: Secondary | ICD-10-CM | POA: Diagnosis present

## 2023-07-01 DIAGNOSIS — R252 Cramp and spasm: Secondary | ICD-10-CM | POA: Diagnosis present

## 2023-07-01 DIAGNOSIS — M6281 Muscle weakness (generalized): Secondary | ICD-10-CM | POA: Diagnosis present

## 2023-07-05 ENCOUNTER — Ambulatory Visit: Payer: PRIVATE HEALTH INSURANCE | Attending: Orthopedic Surgery

## 2023-07-05 DIAGNOSIS — M6281 Muscle weakness (generalized): Secondary | ICD-10-CM | POA: Diagnosis present

## 2023-07-05 DIAGNOSIS — M25512 Pain in left shoulder: Secondary | ICD-10-CM | POA: Diagnosis present

## 2023-07-05 DIAGNOSIS — R252 Cramp and spasm: Secondary | ICD-10-CM | POA: Insufficient documentation

## 2023-07-05 NOTE — Therapy (Signed)
OUTPATIENT PHYSICAL THERAPY SHOULDER TREATMENT    Patient Name: Bruce Simmons MRN: [id number] DOB:1976-05-01, 47 y.o., male Today's Date: 07/05/2023  END OF SESSION:  PT End of Session - 07/05/23 0856     Visit Number 12    Number of Visits 18    Date for PT Re-Evaluation 07/05/23    Authorization Type cone aetna    Authorization - Visit Number 12    Authorization - Number of Visits 18    Progress Note Due on Visit 17    PT Start Time 0845    PT Stop Time 0925    PT Time Calculation (min) 40 min    Activity Tolerance Patient tolerated treatment well    Behavior During Therapy WFL for tasks assessed/performed                Past Medical History:  Diagnosis Date   Anxiety    GERD (gastroesophageal reflux disease)    Seasonal allergies    Sleep apnea    Sleep-related bruxism    Past Surgical History:  Procedure Laterality Date   SHOULDER ARTHROSCOPY WITH ROTATOR CUFF REPAIR AND SUBACROMIAL DECOMPRESSION Left 04/23/2023   Procedure: SHOULDER ARTHROSCOPY WITH ROTATOR CUFF DEBRIDEMENT VERSUS REPAIR  AND SUBACROMIAL DECOMPRESSION, OPEN BICEPS TENODESIS;  Surgeon: Jones Broom, MD;  Location: [facility];  Service: Orthopedics;  Laterality: Left;   Patient Active Problem List   Diagnosis Date Noted   OSA (obstructive sleep apnea) 11/11/2013    PCP: Rebecka Apley, NP  REFERRING PROVIDER: Jones Broom, MD  REFERRING DIAG: SHOULDER LONG HEAD BICEPS TENODESIS  THERAPY DIAG:  Muscle weakness (generalized)  Cramp and spasm  Acute pain of left shoulder  Rationale for Evaluation and Treatment: Rehabilitation  ONSET DATE: 04/23/2023 surgery date, actual injury occurred in April 2024  SUBJECTIVE:                                                                                                                                                                                      SUBJECTIVE STATEMENT: Patient reports he is a little sore  because he did a little raking but still doing well.   EVAL: Pt initially injured his left shoulder at work in April 2024.  After failed conservative treatment, he had to undergo left shoulder arthroscopic debridement for partial rotator cuff tear, SLAP tear and subacromial bursa with left shoulder decompression and left shoulder subpectoral biceps tenodesis by Dr Ave Filter on 04/23/2023.  Hand dominance: Right  PERTINENT HISTORY: Left shoulder arthroscopic debridement for partial rotator cuff tear, SLAP tear and subacromial bursa with left shoulder decompression and left shoulder subpectoral biceps tenodesis by Dr Ave Filter  on 04/23/2023 GERD, sleep apnea with CPAP  PAIN:  Are you having pain? Yes: NPRS scale: in general 0-2 up to 10/10 Pain location: left shoulder Pain description: sore, but if he moves wrong can be a quick/sharp 10/10 pain Aggravating factors: certain movements Relieving factors: rest  PRECAUTIONS: Other: Per MD:  okay for full A/ROM and P/ROM of shoulder, no resisted elbow flexion or supination  RED FLAGS: None   WEIGHT BEARING RESTRICTIONS: No  FALLS:  Has patient fallen in last 6 months? No   OCCUPATION: Works for [employer].  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  NEXT MD VISIT: Dr Ave Filter in November  OBJECTIVE:   DIAGNOSTIC FINDINGS:  Left shoulder MRI on 03/02/2023 (prior to surgery): IMPRESSION: 1. Moderate tendinosis of the supraspinatus tendon with a small partial-thickness bursal surface tear along the  anterior-most aspect and a small insertional interstitial tears of the posterior most aspect. 2. Severe tendinosis of the infraspinatus tendon. 3. Mild tendinosis of the intra-articular portion of the long head of the biceps tendon.  PATIENT SURVEYS:  FOTO 46 (projected 62 by visit 19)  06/25/23:   FOTO 57 (projected 62 by visit 19)  COGNITION: Overall cognitive status: Within functional limits for tasks assessed     SENSATION: Denies numbness or tingling  POSTURE: Rounded shoulders, forward head  UPPER EXTREMITY ROM:   Active ROM Right eval Left eval Left 05/27/23 Left 06/25/23  Shoulder flexion 170 63 148 seated/145 supine  148 active 155 passive  Shoulder extension 50 35 43 na  Shoulder abduction 165 56 62 seated/88 supine 158 active 170 passive  Shoulder adduction      Shoulder internal rotation    68 active 70 passive  Shoulder external rotation    73 active 80 passive  (Blank rows = not tested)  UPPER EXTREMITY MMT:  Eval:   Right shoulder is WFL Left shoulder is at least 3- to 3/5    TODAY'S TREATMENT:     LEFT SHOULDER                                                                                                                                    DATE: 07/05/23 UBE L 2 x 6 min fwd only 3 way scapular stabilization with blue loop (left) 4D ball rolls x 20 with yellow plyo ball left Wall clocks with blue plyo ball x 10 (12 to 6 and back equals 1 rep) left Seated eccentric biceps lowering with 5 lbs  x 20 left Standing shoulder flexion and scaption with 3 lbs 2 x 10 both Prone shoulder drop and catch 2 x 10 with blue plyo ball left Prone shoulder ext 2 x 10 with 3# left Prone shoulder row  2 x 10 with 3# left Prone shoulder horizontal abduction 2 x 10 3# left Side lying ER with 2# 2 x 10 Supine shoulder alphabet with 3# A-Z PROM all planes of motion  07/01/23 UBE L 2 x 6 min (3/3) 3 way scapular stabilization with blue loop (left) 4D ball rolls x  20 with yellow plyo ball Wall clocks with blue plyo ball x 10 (12 to 6 and back equals 1 rep) Seated eccentric biceps lowering with 4 lbs x 20 Standing shoulder flexion and scaption with 2 lbs 2 x 10 Prone shoulder ext 2 x 10 with 2# left Prone shoulder row  2 x 10 with 2# left Prone shoulder horizontal abduction 2 x 10 2# left Side lying ER with 1# 2 x 10 Supine shoulder alphabet with 2# A-Z PROM all planes of motion  06/25/23 (patient 10 min late for appt) UBE x 6 min (3/3) Re-assessment and FOTO completed 3 way scapular stabilization with blue loop (left) 4D ball rolls x 20 with yellow plyo ball Wall clocks with blue plyo ball x 5 (12 to 6 and back equals 1 rep) Seated eccentric biceps lowering with 4 lbs x 20 Standing shoulder flexion and scaption with 1 lbs 2 x 10 Prone shoulder ext 2 x 10 with 2# left Prone shoulder row  2 x 10 with 2# left Prone shoulder horizontal abduction 2 x 10 2# left Side lying ER with 2# 2 x 10 Supine shoulder alphabet with 2# A-Z PROM all planes of motion  06/21/23 UBE x 5 min fwd only level 1  3 way scapular stabilization with blue loop (left) 4D ball rolls x 20 with yellow plyo ball Seated eccentric biceps lowering with 4 lbs Wall clocks with blue plyo ball x 5 (12 to 6 and back equals 1 rep) Standing shoulder flexion and scaption with 1 lbs 2 x 10 Side lying ER with 2# 2 x 10 Supine shoulder alphabet with 2# A-Z Prone shoulder ext 2 x 10 with 2# left Prone shoulder row  2 x 10 with 2# left Prone shoulder horizontal abduction 2 x 10 2# left Sidelying left shoulder ER with 2# 2 x 10 PROM all planes of motion  PATIENT EDUCATION: Education details: Issued HEP Person educated: Patient Education method: Explanation, Demonstration, and Handouts Education comprehension: verbalized understanding and returned demonstration  HOME EXERCISE PROGRAM: Access Code: [email or web address] URL: [email or web address] Date: 05/15/2023 Prepared by:  Clydie Braun Menke  Exercises - Seated Scapular Retraction  - 1 x daily - 7 x weekly - 2 sets - 10 reps - Circular Shoulder Pendulum with Table Support  - 1 x daily - 7 x weekly - 2 sets - 10 reps - Seated Shoulder Abduction Towel Slide at Table Top  - 1 x daily - 7 x weekly - 2 sets - 10 reps - Seated Shoulder Flexion Towel Slide at Table Top  - 1 x daily - 7 x weekly - 2 sets - 10 reps - Shoulder Flexion Wall Slide with Towel  - 1 x daily - 7 x weekly - 2 sets - 10 reps - Standing Shoulder Abduction Slides at Wall  - 1 x daily - 7 x weekly - 2 sets - 10 reps  ASSESSMENT:  CLINICAL IMPRESSION: [employer]  was able to tolerate increased resistance on all exercises without pain but with moderate fatigue.  He completed one set of SL ER with 3 lbs then needed to drop to 2# for second set.  He was educated on need to be cautious to avoid repetitive motion such as raking to avoid irritating the biceps tendon.  He has nearly full ROM with slight restriction at end range flexion and IR.  He continues to demonstrate potential for improvement and would benefit from continued skilled therapy to address impairments.     OBJECTIVE IMPAIRMENTS: decreased ROM, decreased strength, increased muscle spasms, impaired flexibility, postural dysfunction, and pain.   ACTIVITY LIMITATIONS: carrying, lifting, sleeping, reach over head, and caring for others  PARTICIPATION LIMITATIONS: community activity, occupation, and yard work  PERSONAL FACTORS: Profession and Time since onset of injury/illness/exacerbation are also affecting patient's functional outcome.   REHAB POTENTIAL: Good  CLINICAL DECISION MAKING: Stable/uncomplicated  EVALUATION COMPLEXITY: Low   GOALS: Goals reviewed with patient? Yes  SHORT TERM GOALS: Target date: 05/31/2023  Pt will be independent with initial HEP. Baseline: Goal status: MET  2.  Patient will increase left shoulder flexion and abduction A/ROM by 10 degrees to allow patient to  reach into shelves. Baseline:  Goal status: MET 06/12/23   LONG TERM GOALS: Target date: 07/05/2023  Patient to be independent with advanced HEP to allow for self progression post discharge. Baseline:  Goal status: IN PROGRESS 06/05/23  2.  Patient to increase left shoulder A/ROM to [facility] to allow him to reach overhead for job related tasks. Baseline: see above Goal status: MET 06/05/23  3.  Patient to increase left shoulder strength to at least 4+/5 to allow him to push/pull items as needed at work and to assist with transfers. Baseline: 3-/5 Goal status: In progress  4.  Patient to increase FOTO to at least 62 to demonstrate improvements in functional mobility. Baseline:  Goal status: In progress  5.  Patient able to demonstrate job simulated tasks in PT clinic without increased pain or to report ability to return to full duty at work without increased shoulder pain. Baseline:  Goal status: In progress    PLAN:  PT FREQUENCY: 1-2x/week  PT DURATION: 8 weeks  PLANNED INTERVENTIONS: Therapeutic exercises, Therapeutic activity, Neuromuscular re-education, Balance training, Gait training, Patient/Family education, Self Care, Joint mobilization, Joint manipulation, Aquatic Therapy, Dry Needling, Electrical stimulation, Spinal manipulation, Spinal mobilization, Cryotherapy, Moist heat, scar mobilization, Taping, Vasopneumatic device, Ultrasound, Ionotophoresis 4mg /ml Dexamethasone, Manual therapy, and Re-evaluation  PLAN FOR NEXT SESSION: Continue post scope and tenodesis protocol.  ER strengthening, stability training at around 60-75 degrees of abduction.    Victorino Dike B. Daylee Delahoz, PT 07/05/23 9:31 AM [facility] [address] [place], [place] [address] Phone # [phone] Fax [phone]

## 2023-07-09 ENCOUNTER — Ambulatory Visit: Payer: PRIVATE HEALTH INSURANCE | Attending: Orthopedic Surgery

## 2023-07-09 DIAGNOSIS — R262 Difficulty in walking, not elsewhere classified: Secondary | ICD-10-CM | POA: Insufficient documentation

## 2023-07-09 DIAGNOSIS — R252 Cramp and spasm: Secondary | ICD-10-CM | POA: Diagnosis not present

## 2023-07-09 DIAGNOSIS — M62838 Other muscle spasm: Secondary | ICD-10-CM | POA: Insufficient documentation

## 2023-07-09 DIAGNOSIS — R293 Abnormal posture: Secondary | ICD-10-CM | POA: Diagnosis not present

## 2023-07-09 DIAGNOSIS — M6281 Muscle weakness (generalized): Secondary | ICD-10-CM | POA: Diagnosis not present

## 2023-07-09 DIAGNOSIS — M25512 Pain in left shoulder: Secondary | ICD-10-CM | POA: Diagnosis not present

## 2023-07-09 NOTE — Therapy (Signed)
OUTPATIENT PHYSICAL THERAPY SHOULDER TREATMENT    Patient Name: Bruce Simmons MRN: [id number] DOB:05/04/1976, 47 y.o., male Today's Date: 07/09/2023  END OF SESSION:  PT End of Session - 07/09/23 0900     Visit Number 13    Number of Visits 18    Date for PT Re-Evaluation 08/30/23    Authorization Type cone aetna    Authorization - Visit Number 13    Authorization - Number of Visits 18    Progress Note Due on Visit 17    PT Start Time 0856    PT Stop Time 0924    PT Time Calculation (min) 28 min    Activity Tolerance Patient tolerated treatment well    Behavior During Therapy WFL for tasks assessed/performed                Past Medical History:  Diagnosis Date   Anxiety    GERD (gastroesophageal reflux disease)    Seasonal allergies    Sleep apnea    Sleep-related bruxism    Past Surgical History:  Procedure Laterality Date   SHOULDER ARTHROSCOPY WITH ROTATOR CUFF REPAIR AND SUBACROMIAL DECOMPRESSION Left 04/23/2023   Procedure: SHOULDER ARTHROSCOPY WITH ROTATOR CUFF DEBRIDEMENT VERSUS REPAIR  AND SUBACROMIAL DECOMPRESSION, OPEN BICEPS TENODESIS;  Surgeon: Jones Broom, MD;  Location: [facility];  Service: Orthopedics;  Laterality: Left;   Patient Active Problem List   Diagnosis Date Noted   OSA (obstructive sleep apnea) 11/11/2013    PCP: Rebecka Apley, NP  REFERRING PROVIDER: Jones Broom, MD  REFERRING DIAG: SHOULDER LONG HEAD BICEPS TENODESIS  THERAPY DIAG:  Muscle weakness (generalized)  Cramp and spasm  Acute pain of left shoulder  Difficulty in walking, not elsewhere classified  Other muscle spasm  Abnormal posture  Rationale for Evaluation and Treatment: Rehabilitation  ONSET DATE: 04/23/2023 surgery date, actual injury occurred in April 2024  SUBJECTIVE:                                                                                                                                                                                       SUBJECTIVE STATEMENT: Patient reports he is sore at the anterior shoulder in the area of the biceps tenodesis.   EVAL: Pt initially injured his left shoulder at work in April 2024.  After failed conservative treatment, he had to undergo left shoulder arthroscopic debridement for partial rotator cuff tear, SLAP tear and subacromial bursa with left shoulder decompression and left shoulder subpectoral biceps tenodesis by Dr Ave Filter on 04/23/2023.  Hand dominance: Right  PERTINENT HISTORY: Left shoulder arthroscopic debridement for partial rotator cuff tear, SLAP tear and subacromial bursa  with left shoulder decompression and left shoulder subpectoral biceps tenodesis by Dr Ave Filter on 04/23/2023 GERD, sleep apnea with CPAP  PAIN:  Are you having pain? Yes: NPRS scale: in general 0-2 up to 10/10 Pain location: left shoulder Pain description: sore, but if he moves wrong can be a quick/sharp 10/10 pain Aggravating factors: certain movements Relieving factors: rest  PRECAUTIONS: Other: Per MD:  okay for full A/ROM and P/ROM of shoulder, no resisted elbow flexion or supination  RED FLAGS: None   WEIGHT BEARING RESTRICTIONS: No  FALLS:  Has patient fallen in last 6 months? No   OCCUPATION: Works for [employer].  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  NEXT MD VISIT: Dr Ave Filter in November  OBJECTIVE:   DIAGNOSTIC FINDINGS:  Left shoulder MRI on 03/02/2023 (prior to surgery): IMPRESSION: 1. Moderate tendinosis of the  supraspinatus tendon with a small partial-thickness bursal surface tear along the anterior-most aspect and a small insertional interstitial tears of the posterior most aspect. 2. Severe tendinosis of the infraspinatus tendon. 3. Mild tendinosis of the intra-articular portion of the long head of the biceps tendon.  PATIENT SURVEYS:  FOTO 46 (projected 62 by visit 19)  06/25/23:   FOTO 57 (projected 62 by visit 19)  07/09/23:   FOTO 57.3  (projected 62 by visit 19)  COGNITION: Overall cognitive status: Within functional limits for tasks assessed     SENSATION: Denies numbness or tingling  POSTURE: Rounded shoulders, forward head  UPPER EXTREMITY ROM:   Active ROM Right eval Left eval Left 05/27/23 Left 06/25/23 Left 07/09/23  Shoulder flexion 170 63 148 seated/145 supine  148 active 155 passive WFL  Shoulder extension 50 35 43 na WFL  Shoulder abduction 165 56 62 seated/88 supine 158 active 170 passive WFL  Shoulder adduction       Shoulder internal rotation    68 active 70 passive WFL  Shoulder external rotation    73 active 80 passive WFL  (Blank rows = not tested)  UPPER EXTREMITY MMT:  Eval:   Right shoulder is WFL Left shoulder is at least 3- to 3/5  07/09/23: Left shoulder: flexion: 4-/5, abduction 4/5, IR 4+/5, ER 4/5  (pain with ER and abduction)    TODAY'S TREATMENT:     LEFT SHOULDER                                                                                                                                    DATE: (  patient 13 min late for appt) 07/09/23 UBE L 2 x 6 min fwd only Prone shoulder ext 2 x 10 with 3# left Prone shoulder row  2 x 10 with 3# left Prone shoulder horizontal abduction 2 x 10 3# left Side lying ER with 3# 2 x 10 Supine shoulder alphabet with 3# A-Z Reassessment for recert. : see objective findings Offered ice to control inflammation: patient declined, states he can do it at work  07/05/23 UBE L 2 x 6 min fwd only 3 way  scapular stabilization with blue loop (left) 4D ball rolls x 20 with yellow plyo ball left Wall clocks with blue plyo ball x 10 (12 to 6 and back equals 1 rep) left Seated eccentric biceps lowering with 5 lbs x 20 left Standing shoulder flexion and scaption with 3 lbs 2 x 10 both Prone shoulder drop and catch 2 x 10 with blue plyo ball left Prone shoulder ext 2 x 10 with 3# left Prone shoulder row  2 x 10 with 3# left Prone shoulder horizontal abduction 2 x 10 3# left Side lying ER with 2# 2 x 10 Supine shoulder alphabet with 3# A-Z PROM all planes of motion  07/01/23 UBE L 2 x 6 min (3/3) 3 way scapular stabilization with blue loop (left) 4D ball rolls x 20 with yellow plyo ball Wall clocks with blue plyo ball x 10 (12 to 6 and back equals 1 rep) Seated eccentric biceps lowering with 4 lbs x 20 Standing shoulder flexion and scaption with 2 lbs 2 x 10 Prone shoulder ext 2 x 10 with 2# left Prone shoulder row  2 x 10 with 2# left Prone shoulder horizontal abduction 2 x 10 2# left Side lying ER with 1# 2 x 10 Supine shoulder alphabet with 2# A-Z PROM all planes of motion  PATIENT EDUCATION: Education details: Issued HEP Person educated: Patient Education method: Explanation, Demonstration, and Handouts Education comprehension: verbalized understanding and returned demonstration  HOME EXERCISE PROGRAM: Access Code: [email or web address] URL: [email or web address] Date: 05/15/2023 Prepared by: Clydie Braun Menke  Exercises - Seated Scapular Retraction  - 1 x daily - 7 x weekly - 2 sets - 10 reps - Circular Shoulder Pendulum with Table Support  - 1 x daily - 7 x weekly - 2 sets - 10 reps - Seated Shoulder Abduction Towel Slide at Table Top  - 1 x daily - 7 x weekly - 2 sets - 10 reps - Seated Shoulder Flexion Towel Slide at Table Top  - 1 x daily - 7 x weekly - 2 sets - 10 reps - Shoulder Flexion Wall Slide with Towel  - 1 x daily - 7 x weekly - 2 sets - 10 reps - Standing  Shoulder Abduction Slides at Wall  - 1 x daily - 7 x weekly - 2 sets - 10 reps  ASSESSMENT:  CLINICAL IMPRESSION: Toney Sang was 13 min late for appt.  He was having pain at the proximal biceps area.  We held on any biceps work today and encouraged him to ice heavily and discuss ibuprofen with MD.  Initially, provider did not want him to take ibuprofen.  He declined ice at end of session stating he can do this at work.  Objective findings still improving.  He seems to have inflamed the proximal biceps based on his symptoms.  We will reduce biceps work temporarily and resume when inflammation is better controlled.     OBJECTIVE IMPAIRMENTS: decreased ROM, decreased strength, increased muscle spasms,  impaired flexibility, postural dysfunction, and pain.   ACTIVITY LIMITATIONS: carrying, lifting, sleeping, reach over head, and caring for others  PARTICIPATION LIMITATIONS: community activity, occupation, and yard work  PERSONAL FACTORS: Profession and Time since onset of injury/illness/exacerbation are also affecting patient's functional outcome.   REHAB POTENTIAL: Good  CLINICAL DECISION MAKING: Stable/uncomplicated  EVALUATION COMPLEXITY: Low   GOALS: Goals reviewed with patient? Yes  SHORT TERM GOALS: Target date: 05/31/2023  Pt will be independent with initial HEP. Baseline: Goal status: MET  2.  Patient will increase left shoulder flexion and abduction A/ROM by 10 degrees to allow patient to reach into shelves. Baseline:  Goal status: MET 06/12/23   LONG TERM GOALS: Target date: 07/05/2023  Patient to be independent with advanced HEP to allow for self progression post discharge. Baseline:  Goal status: IN PROGRESS 06/05/23  2.  Patient to increase left shoulder A/ROM to [facility] to allow him to reach overhead for job related tasks. Baseline: see above Goal status: MET 06/05/23  3.  Patient to increase left shoulder strength to at least 4+/5 to allow him to push/pull items as needed  at work and to assist with transfers. Baseline: 3-/5 Goal status: In progress  4.  Patient to increase FOTO to at least 62 to demonstrate improvements in functional mobility. Baseline:  Goal status: In progress  5.  Patient able to demonstrate job simulated tasks in PT clinic without increased pain or to report ability to return to full duty at work without increased shoulder pain. Baseline:  Goal status: In progress    PLAN:  PT FREQUENCY: 1-2x/week  PT DURATION: 8 weeks  PLANNED INTERVENTIONS: Therapeutic exercises, Therapeutic activity, Neuromuscular re-education, Balance training, Gait training, Patient/Family education, Self Care, Joint mobilization, Joint manipulation, Aquatic Therapy, Dry Needling, Electrical stimulation, Spinal manipulation, Spinal mobilization, Cryotherapy, Moist heat, scar mobilization, Taping, Vasopneumatic device, Ultrasound, Ionotophoresis 4mg /ml Dexamethasone, Manual therapy, and Re-evaluation  PLAN FOR NEXT SESSION: Patient has 5 visits left.  Continue post scope and tenodesis protocol.  ER strengthening, stability training at around 60-75 degrees of abduction.    Victorino Dike B. Russie Gulledge, PT 07/09/23 9:30 AM [facility] [address] [place], [place] [address] Phone # [phone] Fax [phone]

## 2023-07-11 ENCOUNTER — Ambulatory Visit: Payer: PRIVATE HEALTH INSURANCE | Admitting: Physical Therapy

## 2023-07-15 NOTE — Therapy (Signed)
OUTPATIENT PHYSICAL THERAPY SHOULDER TREATMENT    Patient Name: Bruce Simmons MRN: [id number] DOB:24-Sep-1975, 47 y.o., male Today's Date: 07/16/2023  END OF SESSION:  PT End of Session - 07/16/23 0938     Visit Number 14    Number of Visits 18    Date for PT Re-Evaluation 08/30/23    Authorization Type cone aetna    Authorization - Visit Number 14    Authorization - Number of Visits 18    PT Start Time [phone]    PT Stop Time 1014    PT Time Calculation (min) 38 min    Activity Tolerance Patient tolerated treatment well    Behavior During Therapy WFL for tasks assessed/performed                 Past Medical History:  Diagnosis Date   Anxiety    GERD (gastroesophageal reflux disease)    Seasonal allergies    Sleep apnea    Sleep-related bruxism    Past Surgical History:  Procedure Laterality Date   SHOULDER ARTHROSCOPY WITH ROTATOR CUFF REPAIR AND SUBACROMIAL DECOMPRESSION Left 04/23/2023   Procedure: SHOULDER ARTHROSCOPY WITH ROTATOR CUFF DEBRIDEMENT VERSUS REPAIR  AND SUBACROMIAL DECOMPRESSION, OPEN BICEPS TENODESIS;  Surgeon: Jones Broom, MD;  Location: [facility];  Service: Orthopedics;  Laterality: Left;   Patient Active Problem List   Diagnosis Date Noted   OSA (obstructive sleep apnea) 11/11/2013    PCP: Rebecka Apley, NP  REFERRING PROVIDER: Jones Broom, MD  REFERRING DIAG: SHOULDER LONG HEAD BICEPS TENODESIS  THERAPY DIAG:  Muscle weakness (generalized)  Cramp and spasm  Acute pain of left shoulder  Difficulty in walking, not elsewhere classified  Other muscle spasm  Rationale for Evaluation and Treatment: Rehabilitation  ONSET DATE: 04/23/2023 surgery date, actual injury occurred in April 2024  SUBJECTIVE:                                                                                                                                                                                      SUBJECTIVE  STATEMENT: Saw MD. He's sending a new order. Says we can start with weights now and just work through it. I had some pain last week, but it's better this week.  EVAL: Pt initially injured his left shoulder at work in April 2024.  After failed conservative treatment, he had to undergo left shoulder arthroscopic debridement for partial rotator cuff tear, SLAP tear and subacromial bursa with left shoulder decompression and left shoulder subpectoral biceps tenodesis by Dr Ave Filter on 04/23/2023.  Hand dominance: Right  PERTINENT HISTORY: Left shoulder arthroscopic debridement for partial rotator cuff tear, SLAP tear and  subacromial bursa with left shoulder decompression and left shoulder subpectoral biceps tenodesis by Dr Ave Filter on 04/23/2023 GERD, sleep apnea with CPAP  PAIN:  Are you having pain? Yes: NPRS scale: in general 0/10 Pain location: left shoulder Pain description: sore, but if he moves wrong can be a quick/sharp 10/10 pain Aggravating factors: certain movements Relieving factors: rest  PRECAUTIONS: Other: Per MD:  okay for full A/ROM and P/ROM of shoulder, no resisted elbow flexion or supination  RED FLAGS: None   WEIGHT BEARING RESTRICTIONS: No  FALLS:  Has patient fallen in last 6 months? No   OCCUPATION: Works for [employer].  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  NEXT MD VISIT: Dr Ave Filter in November  OBJECTIVE:   DIAGNOSTIC FINDINGS:  Left shoulder MRI on 03/02/2023 (prior to surgery): IMPRESSION: 1. Moderate tendinosis of  the supraspinatus tendon with a small partial-thickness bursal surface tear along the anterior-most aspect and a small insertional interstitial tears of the posterior most aspect. 2. Severe tendinosis of the infraspinatus tendon. 3. Mild tendinosis of the intra-articular portion of the long head of the biceps tendon.  PATIENT SURVEYS:  FOTO 46 (projected 62 by visit 19)  06/25/23:   FOTO 57 (projected 62 by visit 19)  07/09/23:   FOTO 57.3  (projected 62 by visit 19)  COGNITION: Overall cognitive status: Within functional limits for tasks assessed     SENSATION: Denies numbness or tingling  POSTURE: Rounded shoulders, forward head  UPPER EXTREMITY ROM:   Active ROM Right eval Left eval Left 05/27/23 Left 06/25/23 Left 07/09/23  Shoulder flexion 170 63 148 seated/145 supine  148 active 155 passive WFL  Shoulder extension 50 35 43 na WFL  Shoulder abduction 165 56 62 seated/88 supine 158 active 170 passive WFL  Shoulder adduction       Shoulder internal rotation    68 active 70 passive WFL  Shoulder external rotation    73 active 80 passive WFL  (Blank rows = not tested)  UPPER EXTREMITY MMT:  Eval:   Right shoulder is WFL Left shoulder is at least 3- to 3/5  07/09/23: Left shoulder: flexion: 4-/5, abduction 4/5, IR 4+/5, ER 4/5  (pain with ER and abduction)    TODAY'S TREATMENT:     LEFT SHOULDER                                                                                                                                    DATE: (patient  13 min late for appt)  07/16/23 UBE L 2 x 6 min 62fwd/3bwd Standing shoulder flexion and scaption with 3 lbs 2 x 10 both Side lying ER with 3# 2 x 10 Prone shoulder ext 2 x 10 with 3# left Prone shoulder row  2 x 10 with 3# left Prone shoulder horizontal abduction 2 x 10 3# left Prone shoulder drop and catch 2 x 10 with blue plyo ball left Seated eccentric biceps lowering with 5 lbs x 10, 8# x 10 3 way scapular  stabilization with blue loop (left) 4D ball rolls x 20 with yellow plyo ball left Wall clocks with blue plyo ball x 7 (12 to 6 and back equals 1 rep) left Serratus push standing Blue band x 20 Standing ER Red band B x 20 L shoulder ER stretch in doorway 2x30 sec   07/09/23 UBE L 2 x 6 min fwd only Prone shoulder ext 2 x 10 with 3# left Prone shoulder row  2 x 10 with 3# left Prone shoulder horizontal abduction 2 x 10 3# left Side lying ER with 3# 2 x 10 Supine shoulder alphabet with 3# A-Z Reassessment for recert. : see objective findings Offered ice to control inflammation: patient declined, states he can do it at work  07/05/23 UBE L 2 x 6 min fwd only 3 way scapular stabilization with blue loop (left) 4D ball rolls x 20 with yellow plyo ball left Wall clocks with blue plyo ball x 10 (12 to 6 and back equals 1 rep) left Seated eccentric biceps lowering with 5 lbs x 20 left Standing shoulder flexion and scaption with 3 lbs 2 x 10 both Prone shoulder drop and catch 2 x 10 with blue plyo ball left Prone shoulder ext 2 x 10 with 3# left Prone shoulder row  2 x 10 with 3# left Prone shoulder horizontal abduction 2 x 10 3# left Side lying ER with 2# 2 x 10 Supine shoulder alphabet with 3# A-Z PROM all planes of motion  07/01/23 UBE L 2 x 6 min (3/3) 3 way scapular stabilization with blue loop (left) 4D ball rolls x 20 with yellow plyo ball Wall clocks with blue plyo ball x 10 (12 to 6 and back equals 1 rep) Seated eccentric biceps lowering with 4 lbs x 20 Standing shoulder flexion and scaption with 2 lbs 2 x 10 Prone shoulder ext 2 x 10 with 2# left Prone shoulder row  2 x 10 with 2# left Prone shoulder horizontal abduction 2 x 10 2# left Side lying ER with 1# 2 x 10 Supine shoulder alphabet with 2# A-Z PROM all planes of motion  PATIENT EDUCATION: Education details: Issued HEP Person educated: Patient Education method: Explanation, Demonstration, and Handouts Education  comprehension: verbalized understanding and returned demonstration  HOME EXERCISE PROGRAM: Access Code: [email or web address] URL: [email or web address] Date: 05/15/2023 Prepared by: Clydie Braun Menke  Exercises - Seated Scapular Retraction  - 1 x daily - 7 x weekly - 2 sets - 10 reps - Circular Shoulder Pendulum with Table Support  - 1 x daily - 7 x weekly - 2 sets - 10 reps - Seated Shoulder Abduction Towel Slide at Table Top  - 1 x daily - 7 x weekly - 2 sets - 10 reps - Seated Shoulder Flexion Towel Slide at Table Top  - 1 x daily - 7 x weekly - 2 sets - 10 reps - Shoulder Flexion Wall Slide with Towel  - 1 x daily - 7 x  weekly - 2 sets - 10 reps - Standing Shoulder Abduction Slides at Wall  - 1 x daily - 7 x weekly - 2 sets - 10 reps  ASSESSMENT:  CLINICAL IMPRESSION: Rusty reports MD said to strengthen with weights and that he isn't going to damage anything or pull anything loose. We progressed biceps weight with no complaint. His main pain is still in 9 to 11 range with wall clocks. He liked standing ER with resistance and the ER doorway stretch so he plans to add these to his HEP. He also said Dr. Ave Filter would be sending a new order.   OBJECTIVE IMPAIRMENTS: decreased ROM, decreased strength, increased muscle spasms, impaired flexibility, postural dysfunction, and pain.   ACTIVITY LIMITATIONS: carrying, lifting, sleeping, reach over head, and caring for others  PARTICIPATION LIMITATIONS: community activity, occupation, and yard work  PERSONAL FACTORS: Profession and Time since onset of injury/illness/exacerbation are also affecting patient's functional outcome.   REHAB POTENTIAL: Good  CLINICAL DECISION MAKING: Stable/uncomplicated  EVALUATION COMPLEXITY: Low   GOALS: Goals reviewed with patient? Yes  SHORT TERM GOALS: Target date: 05/31/2023  Pt will be independent with initial HEP. Baseline: Goal status: MET  2.  Patient will increase left shoulder flexion and  abduction A/ROM by 10 degrees to allow patient to reach into shelves. Baseline:  Goal status: MET 06/12/23   LONG TERM GOALS: Target date: 08/23/23  Patient to be independent with advanced HEP to allow for self progression post discharge. Baseline:  Goal status: IN PROGRESS 06/05/23  2.  Patient to increase left shoulder A/ROM to [facility] to allow him to reach overhead for job related tasks. Baseline: see above Goal status: MET 06/05/23  3.  Patient to increase left shoulder strength to at least 4+/5 to allow him to push/pull items as needed at work and to assist with transfers. Baseline: 3-/5 Goal status: In progress  4.  Patient to increase FOTO to at least 62 to demonstrate improvements in functional mobility. Baseline:  Goal status: In progress  5.  Patient able to demonstrate job simulated tasks in PT clinic without increased pain or to report ability to return to full duty at work without increased shoulder pain. Baseline:  Goal status: In progress    PLAN:  PT FREQUENCY: 1-2x/week  PT DURATION: 8 weeks  PLANNED INTERVENTIONS: Therapeutic exercises, Therapeutic activity, Neuromuscular re-education, Balance training, Gait training, Patient/Family education, Self Care, Joint mobilization, Joint manipulation, Aquatic Therapy, Dry Needling, Electrical stimulation, Spinal manipulation, Spinal mobilization, Cryotherapy, Moist heat, scar mobilization, Taping, Vasopneumatic device, Ultrasound, Ionotophoresis 4mg /ml Dexamethasone, Manual therapy, and Re-evaluation  PLAN FOR NEXT SESSION: Patient has 4 visits left.  Continue post scope and tenodesis protocol.  ER strengthening, stability training at around 60-75 degrees of abduction.    Solon Palm, PT  07/16/23 10:26 AM [facility] [address], [address] [place], [place] [address] Phone # [phone] Fax [phone]

## 2023-07-16 ENCOUNTER — Encounter: Payer: Self-pay | Admitting: Physical Therapy

## 2023-07-16 ENCOUNTER — Ambulatory Visit: Payer: PRIVATE HEALTH INSURANCE | Attending: Orthopedic Surgery | Admitting: Physical Therapy

## 2023-07-16 DIAGNOSIS — M62838 Other muscle spasm: Secondary | ICD-10-CM | POA: Diagnosis present

## 2023-07-16 DIAGNOSIS — M25512 Pain in left shoulder: Secondary | ICD-10-CM | POA: Insufficient documentation

## 2023-07-16 DIAGNOSIS — M6281 Muscle weakness (generalized): Secondary | ICD-10-CM | POA: Diagnosis present

## 2023-07-16 DIAGNOSIS — R252 Cramp and spasm: Secondary | ICD-10-CM | POA: Insufficient documentation

## 2023-07-16 DIAGNOSIS — R262 Difficulty in walking, not elsewhere classified: Secondary | ICD-10-CM | POA: Diagnosis present

## 2023-07-22 DIAGNOSIS — G4733 Obstructive sleep apnea (adult) (pediatric): Secondary | ICD-10-CM | POA: Diagnosis not present

## 2023-07-22 NOTE — Therapy (Signed)
OUTPATIENT PHYSICAL THERAPY SHOULDER TREATMENT    Patient Name: Bruce Simmons MRN: [id number] DOB:[birth year], 47 y.o., male Today's Date: 07/23/2023  END OF SESSION:  PT End of Session - 07/23/23 0849     Visit Number 15    Number of Visits 18    Date for PT Re-Evaluation 08/30/23    Authorization Type cone aetna    Authorization - Visit Number 15    Authorization - Number of Visits 18    PT Start Time 0847    PT Stop Time 0925    PT Time Calculation (min) 38 min    Activity Tolerance Patient tolerated treatment well    Behavior During Therapy WFL for tasks assessed/performed                  Past Medical History:  Diagnosis Date   Anxiety    GERD (gastroesophageal reflux disease)    Seasonal allergies    Sleep apnea    Sleep-related bruxism    Past Surgical History:  Procedure Laterality Date   SHOULDER ARTHROSCOPY WITH ROTATOR CUFF REPAIR AND SUBACROMIAL DECOMPRESSION Left 04/23/2023   Procedure: SHOULDER ARTHROSCOPY WITH ROTATOR CUFF DEBRIDEMENT VERSUS REPAIR  AND SUBACROMIAL DECOMPRESSION, OPEN BICEPS TENODESIS;  Surgeon: Jones Broom, MD;  Location: [facility];  Service: Orthopedics;  Laterality: Left;   Patient Active Problem List   Diagnosis Date Noted   OSA (obstructive sleep apnea) 11/11/2013    PCP: Rebecka Apley, NP  REFERRING PROVIDER: Jones Broom, MD  REFERRING DIAG: SHOULDER LONG HEAD BICEPS TENODESIS  THERAPY DIAG:  Muscle weakness (generalized)  Cramp and spasm  Acute pain of left shoulder  Rationale for Evaluation and Treatment: Rehabilitation  ONSET DATE: 04/23/2023 surgery date, actual injury occurred in April 2024  SUBJECTIVE:                                                                                                                                                                                      SUBJECTIVE STATEMENT: No new complaints with shoulder.   EVAL: Pt initially injured his  left shoulder at work in April 2024.  After failed conservative treatment, he had to undergo left shoulder arthroscopic debridement for partial rotator cuff tear, SLAP tear and subacromial bursa with left shoulder decompression and left shoulder subpectoral biceps tenodesis by Dr Ave Filter on 04/23/2023.  Hand dominance: Right  PERTINENT HISTORY: Left shoulder arthroscopic debridement for partial rotator cuff tear, SLAP tear and subacromial bursa with left shoulder decompression and left shoulder subpectoral biceps tenodesis by Dr Ave Filter on 04/23/2023 GERD, sleep apnea with CPAP  PAIN:  Are you having pain? Yes: NPRS scale: in general  0/10 Pain location: left shoulder Pain description: sore, but if he moves wrong can be a quick/sharp 10/10 pain Aggravating factors: certain movements Relieving factors: rest  PRECAUTIONS: Other: Per MD:  okay for full A/ROM and P/ROM of shoulder, no resisted elbow flexion or supination  RED FLAGS: None   WEIGHT BEARING RESTRICTIONS: No  FALLS:  Has patient fallen in last 6 months? No   OCCUPATION: Works for [employer].  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  NEXT MD VISIT: Dr Ave Filter in November  OBJECTIVE:   DIAGNOSTIC FINDINGS:  Left shoulder MRI on 03/02/2023 (prior to surgery): IMPRESSION: 1. Moderate tendinosis of the supraspinatus tendon with a small partial-thickness bursal surface tear along the anterior-most aspect and a small insertional interstitial tears of the posterior most aspect. 2. Severe  tendinosis of the infraspinatus tendon. 3. Mild tendinosis of the intra-articular portion of the long head of the biceps tendon.  PATIENT SURVEYS:  FOTO 46 (projected 62 by visit 19)  06/25/23:   FOTO 57 (projected 62 by visit 19)  07/09/23:   FOTO 57.3  (projected 62 by visit 19)  COGNITION: Overall cognitive status: Within functional limits for tasks assessed     SENSATION: Denies numbness or tingling  POSTURE: Rounded shoulders, forward head  UPPER EXTREMITY ROM:   Active ROM Right eval Left eval Left 05/27/23 Left 06/25/23 Left 07/09/23  Shoulder flexion 170 63 148 seated/145 supine  148 active 155 passive WFL  Shoulder extension 50 35 43 na WFL  Shoulder abduction 165 56 62 seated/88 supine 158 active 170 passive WFL  Shoulder adduction       Shoulder internal rotation    68 active 70 passive WFL  Shoulder external rotation    73 active 80 passive WFL  (Blank rows = not tested)  UPPER EXTREMITY MMT:  Eval:   Right shoulder is WFL Left shoulder is at least 3- to 3/5  07/09/23: Left shoulder: flexion: 4-/5, abduction 4/5, IR 4+/5, ER 4/5  (pain with ER and abduction)    TODAY'S TREATMENT:     LEFT SHOULDER                                                                                                                                    DATE:  07/23/23 UBE L 2 x 6 min 53fwd/3bwd PNF yellow D1/2 flex/ext x 10 Standing ER/IR Red band B x 20 Standing shoulder flexion and scaption with 4 lbs 2 x 10 both Standing  shoulder ABD 2# very difficult x 10 Side lying ER with 3# 1 x 10 with 3 sec hold at top and eccentric lower  Prone shoulder ext 2 x 10 with 4# left Prone shoulder row  2 x 10 with 4# left Prone shoulder horizontal abduction 2 x 10 3# left Seated eccentric biceps lowering with 6 lbs x 10, 8# x 10 3 way scapular stabilization with blue loop (left) 4D ball rolls x 20 with yellow plyo ball left Wall clocks with blue plyo ball x 10 (12 to 6 and back  equals 1 rep) left L shoulder ER stretch in doorway 2x30 sec  07/16/23 UBE L 2 x 6 min 43fwd/3bwd Standing shoulder flexion and scaption with 3 lbs 2 x 10 both Side lying ER with 3# 2 x 10 Prone shoulder ext 2 x 10 with 3# left Prone shoulder row  2 x 10 with 3# left Prone shoulder horizontal abduction 2 x 10 3# left Prone shoulder drop and catch 2 x 10 with blue plyo ball left Seated eccentric biceps lowering with 5 lbs x 10, 8# x 10 3 way scapular stabilization with blue loop (left) 4D ball rolls x 20 with yellow plyo ball left Wall clocks with blue plyo ball x 7 (12 to 6 and back equals 1 rep) left Serratus push standing Blue band x 20 Standing ER Red band B x 20 L shoulder ER stretch in doorway 2x30 sec   07/09/23 UBE L 2 x 6 min fwd only Prone shoulder ext 2 x 10 with 3# left Prone shoulder row  2 x 10 with 3# left Prone shoulder horizontal abduction 2 x 10 3# left Side lying ER with 3# 2 x 10 Supine shoulder alphabet with 3# A-Z Reassessment for recert. : see objective findings Offered ice to control inflammation: patient declined, states he can do it at work  07/05/23 UBE L 2 x 6 min fwd only 3 way scapular stabilization with blue loop (left) 4D ball rolls x 20 with yellow plyo ball left Wall clocks with blue plyo ball x 10 (12 to 6 and back equals 1 rep) left Seated eccentric biceps lowering with 5 lbs x 20 left Standing shoulder flexion and scaption with 3 lbs 2 x 10 both Prone shoulder drop and catch 2 x 10 with blue plyo ball left Prone shoulder ext 2 x 10 with 3# left Prone shoulder row  2 x 10 with 3# left Prone shoulder horizontal abduction 2 x 10 3# left Side lying ER with 2# 2 x 10 Supine shoulder alphabet with 3# A-Z PROM all planes of motion  07/01/23 UBE L 2 x 6 min (3/3) 3 way scapular stabilization with blue loop (left) 4D ball rolls x 20 with yellow plyo ball Wall clocks with blue plyo ball x 10 (12 to 6 and back equals 1 rep) Seated eccentric  biceps lowering with 4 lbs x 20 Standing shoulder flexion and scaption with 2 lbs 2 x 10 Prone shoulder ext 2 x 10 with 2# left Prone shoulder row  2 x 10 with 2# left Prone shoulder horizontal abduction 2 x 10 2# left Side lying ER with 1# 2 x 10 Supine shoulder alphabet with 2# A-Z PROM all planes of motion  PATIENT EDUCATION: Education details: Issued HEP Person educated: Patient Education method: Explanation, Demonstration, and Handouts Education comprehension: verbalized understanding and returned demonstration  HOME EXERCISE PROGRAM: Access Code: [email or web address] URL: [email or web address] Date: 05/15/2023 Prepared by: Reather Laurence  Exercises -  Seated Scapular Retraction  - 1 x daily - 7 x weekly - 2 sets - 10 reps - Circular Shoulder Pendulum with Table Support  - 1 x daily - 7 x weekly - 2 sets - 10 reps - Seated Shoulder Abduction Towel Slide at Table Top  - 1 x daily - 7 x weekly - 2 sets - 10 reps - Seated Shoulder Flexion Towel Slide at Table Top  - 1 x daily - 7 x weekly - 2 sets - 10 reps - Shoulder Flexion Wall Slide with Towel  - 1 x daily - 7 x weekly - 2 sets - 10 reps - Standing Shoulder Abduction Slides at Wall  - 1 x daily - 7 x weekly - 2 sets - 10 reps  ASSESSMENT:  CLINICAL IMPRESSION: Toney Sang is progressing well with biceps strengthening with no pain reported. He demonstrates significant weakness with L shoulder ABD and ER and had difficulty with PNF D1 and D2 flexion exercises. He will continue to benefit from ongoing RC strengthening to increase function.   OBJECTIVE IMPAIRMENTS: decreased ROM, decreased strength, increased muscle spasms, impaired flexibility, postural dysfunction, and pain.   ACTIVITY LIMITATIONS: carrying, lifting, sleeping, reach over head, and caring for others  PARTICIPATION LIMITATIONS: community activity, occupation, and yard work  PERSONAL FACTORS: Profession and Time since onset of injury/illness/exacerbation are also  affecting patient's functional outcome.   REHAB POTENTIAL: Good  CLINICAL DECISION MAKING: Stable/uncomplicated  EVALUATION COMPLEXITY: Low   GOALS: Goals reviewed with patient? Yes  SHORT TERM GOALS: Target date: 05/31/2023  Pt will be independent with initial HEP. Baseline: Goal status: MET  2.  Patient will increase left shoulder flexion and abduction A/ROM by 10 degrees to allow patient to reach into shelves. Baseline:  Goal status: MET 06/12/23   LONG TERM GOALS: Target date: 08/23/23  Patient to be independent with advanced HEP to allow for self progression post discharge. Baseline:  Goal status: IN PROGRESS 06/05/23  2.  Patient to increase left shoulder A/ROM to [facility] to allow him to reach overhead for job related tasks. Baseline: see above Goal status: MET 06/05/23  3.  Patient to increase left shoulder strength to at least 4+/5 to allow him to push/pull items as needed at work and to assist with transfers. Baseline: 3-/5 Goal status: In progress  4.  Patient to increase FOTO to at least 62 to demonstrate improvements in functional mobility. Baseline:  Goal status: In progress  5.  Patient able to demonstrate job simulated tasks in PT clinic without increased pain or to report ability to return to full duty at work without increased shoulder pain. Baseline:  Goal status: In progress    PLAN:  PT FREQUENCY: 1-2x/week  PT DURATION: 8 weeks  PLANNED INTERVENTIONS: Therapeutic exercises, Therapeutic activity, Neuromuscular re-education, Balance training, Gait training, Patient/Family education, Self Care, Joint mobilization, Joint manipulation, Aquatic Therapy, Dry Needling, Electrical stimulation, Spinal manipulation, Spinal mobilization, Cryotherapy, Moist heat, scar mobilization, Taping, Vasopneumatic device, Ultrasound, Ionotophoresis 4mg /ml Dexamethasone, Manual therapy, and Re-evaluation  PLAN FOR NEXT SESSION: Patient has 3 visits left.  Continue post  scope and tenodesis protocol.  ER strengthening, stability training at around 60-75 degrees of abduction.    Solon Palm, PT  07/23/23 5:36 PM [facility] [address], [address] [place], [place] [address] Phone # [phone] Fax [phone]

## 2023-07-23 ENCOUNTER — Encounter: Payer: Self-pay | Admitting: Physical Therapy

## 2023-07-23 ENCOUNTER — Ambulatory Visit: Payer: PRIVATE HEALTH INSURANCE | Admitting: Physical Therapy

## 2023-07-23 DIAGNOSIS — M6281 Muscle weakness (generalized): Secondary | ICD-10-CM

## 2023-07-23 DIAGNOSIS — M25512 Pain in left shoulder: Secondary | ICD-10-CM | POA: Diagnosis not present

## 2023-07-23 DIAGNOSIS — R252 Cramp and spasm: Secondary | ICD-10-CM | POA: Diagnosis not present

## 2023-07-23 DIAGNOSIS — R262 Difficulty in walking, not elsewhere classified: Secondary | ICD-10-CM | POA: Diagnosis not present

## 2023-07-23 DIAGNOSIS — M62838 Other muscle spasm: Secondary | ICD-10-CM | POA: Diagnosis not present

## 2023-07-23 DIAGNOSIS — R293 Abnormal posture: Secondary | ICD-10-CM | POA: Diagnosis not present

## 2023-07-24 ENCOUNTER — Other Ambulatory Visit (HOSPITAL_COMMUNITY): Payer: Self-pay

## 2023-07-24 ENCOUNTER — Encounter (HOSPITAL_COMMUNITY): Payer: Self-pay

## 2023-07-24 DIAGNOSIS — G4733 Obstructive sleep apnea (adult) (pediatric): Secondary | ICD-10-CM | POA: Diagnosis not present

## 2023-07-24 DIAGNOSIS — I4510 Unspecified right bundle-branch block: Secondary | ICD-10-CM | POA: Diagnosis not present

## 2023-07-24 DIAGNOSIS — F411 Generalized anxiety disorder: Secondary | ICD-10-CM | POA: Diagnosis not present

## 2023-07-24 DIAGNOSIS — Z9889 Other specified postprocedural states: Secondary | ICD-10-CM | POA: Diagnosis not present

## 2023-07-24 DIAGNOSIS — Z6841 Body Mass Index (BMI) 40.0 and over, adult: Secondary | ICD-10-CM | POA: Diagnosis not present

## 2023-07-24 MED ORDER — WEGOVY 0.25 MG/0.5ML ~~LOC~~ SOAJ
0.2500 mg | SUBCUTANEOUS | 1 refills | Status: AC
  Filled 2023-07-24: qty 2, 28d supply, fill #0

## 2023-07-24 MED ORDER — ESCITALOPRAM OXALATE 5 MG PO TABS
5.0000 mg | ORAL_TABLET | Freq: Every day | ORAL | 4 refills | Status: DC
  Filled 2023-07-24: qty 90, 90d supply, fill #0
  Filled 2024-04-07: qty 90, 90d supply, fill #1

## 2023-07-25 ENCOUNTER — Encounter (HOSPITAL_COMMUNITY): Payer: Self-pay

## 2023-07-25 ENCOUNTER — Other Ambulatory Visit (HOSPITAL_COMMUNITY): Payer: Self-pay

## 2023-07-25 ENCOUNTER — Other Ambulatory Visit: Payer: Self-pay

## 2023-07-25 MED ORDER — TADALAFIL 5 MG PO TABS
ORAL_TABLET | ORAL | 1 refills | Status: AC
  Filled 2023-07-25 (×2): qty 30, 60d supply, fill #0
  Filled 2024-04-07: qty 30, 60d supply, fill #1

## 2023-07-25 MED ORDER — PHENTERMINE HCL 37.5 MG PO TABS
ORAL_TABLET | ORAL | 0 refills | Status: DC
Start: 2023-07-25 — End: 2023-08-21
  Filled 2023-07-25: qty 30, 30d supply, fill #0

## 2023-07-29 ENCOUNTER — Ambulatory Visit: Payer: PRIVATE HEALTH INSURANCE | Attending: Orthopedic Surgery

## 2023-07-29 DIAGNOSIS — M6281 Muscle weakness (generalized): Secondary | ICD-10-CM | POA: Diagnosis present

## 2023-07-29 DIAGNOSIS — R293 Abnormal posture: Secondary | ICD-10-CM | POA: Diagnosis present

## 2023-07-29 DIAGNOSIS — M62838 Other muscle spasm: Secondary | ICD-10-CM | POA: Insufficient documentation

## 2023-07-29 DIAGNOSIS — R252 Cramp and spasm: Secondary | ICD-10-CM | POA: Diagnosis present

## 2023-07-29 DIAGNOSIS — M25512 Pain in left shoulder: Secondary | ICD-10-CM | POA: Diagnosis present

## 2023-07-29 NOTE — Therapy (Signed)
OUTPATIENT PHYSICAL THERAPY SHOULDER TREATMENT    Patient Name: Bruce Simmons MRN: [id number] DOB:Dec 23, 1975, 47 y.o., male Today's Date: 07/29/2023  END OF SESSION:  PT End of Session - 07/29/23 0902     Visit Number 16    Number of Visits 18    Date for PT Re-Evaluation 08/30/23    Authorization Type cone aetna    Authorization - Visit Number 16    Authorization - Number of Visits 18    Progress Note Due on Visit 17    PT Start Time 0852    PT Stop Time 0930    PT Time Calculation (min) 38 min    Activity Tolerance Patient tolerated treatment well    Behavior During Therapy WFL for tasks assessed/performed                  Past Medical History:  Diagnosis Date   Anxiety    GERD (gastroesophageal reflux disease)    Seasonal allergies    Sleep apnea    Sleep-related bruxism    Past Surgical History:  Procedure Laterality Date   SHOULDER ARTHROSCOPY WITH ROTATOR CUFF REPAIR AND SUBACROMIAL DECOMPRESSION Left 04/23/2023   Procedure: SHOULDER ARTHROSCOPY WITH ROTATOR CUFF DEBRIDEMENT VERSUS REPAIR  AND SUBACROMIAL DECOMPRESSION, OPEN BICEPS TENODESIS;  Surgeon: Jones Broom, MD;  Location: [facility];  Service: Orthopedics;  Laterality: Left;   Patient Active Problem List   Diagnosis Date Noted   OSA (obstructive sleep apnea) 11/11/2013    PCP: Rebecka Apley, NP  REFERRING PROVIDER: Jones Broom, MD  REFERRING DIAG: SHOULDER LONG HEAD BICEPS TENODESIS  THERAPY DIAG:  Muscle weakness (generalized)  Cramp and spasm  Acute pain of left shoulder  Other muscle spasm  Abnormal posture  Rationale for Evaluation and Treatment: Rehabilitation  ONSET DATE: 04/23/2023 surgery date, actual injury occurred in April 2024  SUBJECTIVE:                                                                                                                                                                                      SUBJECTIVE  STATEMENT: Patient states he is doing ok.  He doesn't feel like he has quite gotten all of his strength back.  He states he still struggles with shoulder ER.  He struggles to do the 3# on this one.  "There's no pain, its just weak"  EVAL: Pt initially injured his left shoulder at work in April 2024.  After failed conservative treatment, he had to undergo left shoulder arthroscopic debridement for partial rotator cuff tear, SLAP tear and subacromial bursa with left shoulder decompression and left shoulder subpectoral biceps tenodesis by Dr  Chandler on 04/23/2023.  Hand dominance: Right  PERTINENT HISTORY: Left shoulder arthroscopic debridement for partial rotator cuff tear, SLAP tear and subacromial bursa with left shoulder decompression and left shoulder subpectoral biceps tenodesis by Dr Ave Filter on 04/23/2023 GERD, sleep apnea with CPAP  PAIN:  Are you having pain? Yes: NPRS scale: in general 0/10 Pain location: left shoulder Pain description: sore, but if he moves wrong can be a quick/sharp 10/10 pain Aggravating factors: certain movements Relieving factors: rest  PRECAUTIONS: Other: Per MD:  okay for full A/ROM and P/ROM of shoulder, no resisted elbow flexion or supination  RED FLAGS: None   WEIGHT BEARING RESTRICTIONS: No  FALLS:  Has patient fallen in last 6 months? No   OCCUPATION: Works for [employer].  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  NEXT MD VISIT: Dr Ave Filter in November  OBJECTIVE:   DIAGNOSTIC FINDINGS:  Left shoulder MRI on  03/02/2023 (prior to surgery): IMPRESSION: 1. Moderate tendinosis of the supraspinatus tendon with a small partial-thickness bursal surface tear along the anterior-most aspect and a small insertional interstitial tears of the posterior most aspect. 2. Severe tendinosis of the infraspinatus tendon. 3. Mild tendinosis of the intra-articular portion of the long head of the biceps tendon.  PATIENT SURVEYS:  FOTO 46 (projected 62 by visit 19)  06/25/23:   FOTO 57 (projected 62 by visit 19)  07/09/23:   FOTO 57.3  (projected 62 by visit 19)  COGNITION: Overall cognitive status: Within functional limits for tasks assessed     SENSATION: Denies numbness or tingling  POSTURE: Rounded shoulders, forward head  UPPER EXTREMITY ROM:   Active ROM Right eval Left eval Left 05/27/23 Left 06/25/23 Left 07/09/23  Shoulder flexion 170 63 148 seated/145 supine  148 active 155 passive WFL  Shoulder extension 50 35 43 na WFL  Shoulder abduction 165 56 62 seated/88 supine 158 active 170 passive WFL  Shoulder adduction       Shoulder internal rotation    68 active 70 passive WFL  Shoulder external rotation    73 active 80 passive WFL  (Blank rows = not tested)  UPPER EXTREMITY MMT:  Eval:   Right shoulder is WFL Left shoulder is at least 3- to 3/5  07/09/23: Left shoulder: flexion: 4-/5, abduction 4/5, IR 4+/5, ER 4/5  (pain with ER and abduction)    TODAY'S TREATMENT:     LEFT SHOULDER  DATE:  07/29/23 UBE L 2 x 6 min 37fwd/3bwd Standing shoulder flexion and scaption 2 x 10 with 5 lbs Prone drop n catch yellow plyo ball for scapular stabilization x 20 Prone shoulder ext 2 x 10 with 5# left Prone shoulder row  2 x 10 with 5# left Prone shoulder horizontal abduction 2 x 10 5# left Side lying ER with 3# 2 x 10 with 3 sec hold at top and eccentric lower   Seated bilateral ER  PNF yellow D1/2 flex/ext x 10 Standing ER/IR Red band B x 20 Standing shoulder flexion and scaption with 4 lbs 2 x 10 both Standing shoulder ABD 2# very difficult x 10  Seated eccentric biceps lowering with 6 lbs x 10, 8# x 10 3 way scapular stabilization with blue loop (left) 4D ball rolls x 20 with yellow plyo ball left Wall clocks with blue plyo ball x 10 (12 to 6 and back equals 1 rep) left L shoulder ER stretch in doorway 2x30 sec  07/23/23 UBE L 2 x 6 min 32fwd/3bwd PNF yellow D1/2 flex/ext x 10 Standing ER/IR Red band B x 20 Standing shoulder flexion and scaption with 4 lbs 2 x 10 both Standing shoulder ABD 2# very difficult x 10 Side lying ER with 3# 1 x 10 with 3 sec hold at top and eccentric lower  Prone shoulder ext 2 x 10 with 4# left Prone shoulder row  2 x 10 with 4# left Prone shoulder horizontal abduction 2 x 10 3# left Seated eccentric biceps lowering with 6 lbs x 10, 8# x 10 3 way scapular stabilization with blue loop (left) 4D ball rolls x 20 with yellow plyo ball left Wall clocks with blue plyo ball x 10 (12 to 6 and back equals 1 rep) left L shoulder ER stretch in doorway 2x30 sec  07/16/23 UBE L 2 x 6 min 37fwd/3bwd Standing shoulder flexion and scaption with 3 lbs 2 x 10 both Side lying ER with 3# 2 x 10 Prone shoulder ext 2 x 10 with 3# left Prone shoulder row  2 x 10 with 3# left Prone shoulder horizontal abduction 2 x 10 3# left Prone shoulder drop and catch 2 x 10 with blue plyo ball left Seated eccentric biceps lowering with 5 lbs x 10, 8# x 10 3 way scapular stabilization with blue loop (left) 4D ball rolls x 20 with yellow plyo ball left Wall clocks with blue plyo ball x 7 (12 to 6 and back equals 1 rep) left Serratus push standing Blue band x 20 Standing ER Red band B x 20 L shoulder ER stretch in doorway 2x30 sec    PATIENT EDUCATION: Education details: Issued HEP Person educated: Patient Education method:  Programmer, multimedia, Facilities manager, and Handouts Education comprehension: verbalized understanding and returned demonstration  HOME EXERCISE PROGRAM: Access Code: [email or web address] URL: [email or web address] Date: 05/15/2023 Prepared by: Clydie Braun Menke  Exercises - Seated Scapular Retraction  - 1 x daily - 7 x weekly - 2 sets - 10 reps - Circular Shoulder Pendulum with Table Support  - 1 x daily - 7 x weekly - 2 sets - 10 reps - Seated Shoulder Abduction Towel Slide at Table Top  - 1 x daily - 7 x weekly - 2 sets - 10 reps - Seated Shoulder Flexion Towel Slide at Table Top  - 1 x daily - 7 x weekly - 2 sets - 10 reps - Shoulder Flexion Wall Slide with Towel  - 1 x daily -  7 x weekly - 2 sets - 10 reps - Standing Shoulder Abduction Slides at Wall  - 1 x daily - 7 x weekly - 2 sets - 10 reps  ASSESSMENT:  CLINICAL IMPRESSION: Toney Sang is progressing with overall strength but is still lagging with shoulder ER strength.  He has minimal pain but continues to express difficulty with routine daily activities above shoulder level.  He will have to be able to lift gurney/stretcher, with heavy patients, at times. He will continue to benefit from ongoing RC strengthening to increase function.  We will also add in work simulation.    OBJECTIVE IMPAIRMENTS: decreased ROM, decreased strength, increased muscle spasms, impaired flexibility, postural dysfunction, and pain.   ACTIVITY LIMITATIONS: carrying, lifting, sleeping, reach over head, and caring for others  PARTICIPATION LIMITATIONS: community activity, occupation, and yard work  PERSONAL FACTORS: Profession and Time since onset of injury/illness/exacerbation are also affecting patient's functional outcome.   REHAB POTENTIAL: Good  CLINICAL DECISION MAKING: Stable/uncomplicated  EVALUATION COMPLEXITY: Low   GOALS: Goals reviewed with patient? Yes  SHORT TERM GOALS: Target date: 05/31/2023  Pt will be independent with initial  HEP. Baseline: Goal status: MET  2.  Patient will increase left shoulder flexion and abduction A/ROM by 10 degrees to allow patient to reach into shelves. Baseline:  Goal status: MET 06/12/23   LONG TERM GOALS: Target date: 08/23/23  Patient to be independent with advanced HEP to allow for self progression post discharge. Baseline:  Goal status: IN PROGRESS 06/05/23  2.  Patient to increase left shoulder A/ROM to [facility] to allow him to reach overhead for job related tasks. Baseline: see above Goal status: MET 06/05/23  3.  Patient to increase left shoulder strength to at least 4+/5 to allow him to push/pull items as needed at work and to assist with transfers. Baseline: 3-/5 Goal status: In progress  4.  Patient to increase FOTO to at least 62 to demonstrate improvements in functional mobility. Baseline:  Goal status: In progress  5.  Patient able to demonstrate job simulated tasks in PT clinic without increased pain or to report ability to return to full duty at work without increased shoulder pain. Baseline:  Goal status: In progress    PLAN:  PT FREQUENCY: 1-2x/week  PT DURATION: 8 weeks  PLANNED INTERVENTIONS: Therapeutic exercises, Therapeutic activity, Neuromuscular re-education, Balance training, Gait training, Patient/Family education, Self Care, Joint mobilization, Joint manipulation, Aquatic Therapy, Dry Needling, Electrical stimulation, Spinal manipulation, Spinal mobilization, Cryotherapy, Moist heat, scar mobilization, Taping, Vasopneumatic device, Ultrasound, Ionotophoresis 4mg /ml Dexamethasone, Manual therapy, and Re-evaluation  PLAN FOR NEXT SESSION: Patient has 2 visits left on last auth but states WC should be sending more.  Add in work simulation activities: pulling as with using transfer pad to pull patient onto stretcher from bed, pushing as with pushing from other side of bed, raising stretcher, and loading stretcher. Continue post scope and tenodesis  protocol.  ER strengthening, stability training at around 60-75 degrees of abduction.    Victorino Dike B. Myrlene Riera, PT 07/29/23 9:33 AM [facility] [address] [place], [place] [address] Phone # [phone] Fax [phone]

## 2023-07-30 ENCOUNTER — Other Ambulatory Visit: Payer: Self-pay

## 2023-07-31 ENCOUNTER — Encounter: Payer: Self-pay | Admitting: Physical Therapy

## 2023-07-31 ENCOUNTER — Ambulatory Visit: Payer: PRIVATE HEALTH INSURANCE | Admitting: Physical Therapy

## 2023-07-31 DIAGNOSIS — M62838 Other muscle spasm: Secondary | ICD-10-CM | POA: Diagnosis not present

## 2023-07-31 DIAGNOSIS — M25512 Pain in left shoulder: Secondary | ICD-10-CM

## 2023-07-31 DIAGNOSIS — M6281 Muscle weakness (generalized): Secondary | ICD-10-CM | POA: Diagnosis not present

## 2023-07-31 DIAGNOSIS — R293 Abnormal posture: Secondary | ICD-10-CM | POA: Diagnosis not present

## 2023-07-31 DIAGNOSIS — R262 Difficulty in walking, not elsewhere classified: Secondary | ICD-10-CM | POA: Diagnosis not present

## 2023-07-31 DIAGNOSIS — R252 Cramp and spasm: Secondary | ICD-10-CM

## 2023-07-31 NOTE — Therapy (Signed)
OUTPATIENT PHYSICAL THERAPY SHOULDER TREATMENT    Patient Name: Bruce Simmons MRN: [id number] DOB:December 13, 1975, 47 y.o., male Today's Date: 07/31/2023  END OF SESSION:  PT End of Session - 07/31/23 1137     Visit Number 17    Number of Visits 26    Date for PT Re-Evaluation 08/30/23    Authorization Type cone aetna    Authorization Time Period 26 visits now up until 12/16, so he will need a new auth for after 12/16    Authorization - Visit Number 17    Authorization - Number of Visits 18    Progress Note Due on Visit 26    PT Start Time 1105    PT Stop Time 1130   patient had to leave appt early   PT Time Calculation (min) 25 min    Activity Tolerance Patient tolerated treatment well    Behavior During Therapy WFL for tasks assessed/performed                   Past Medical History:  Diagnosis Date   Anxiety    GERD (gastroesophageal reflux disease)    Seasonal allergies    Sleep apnea    Sleep-related bruxism    Past Surgical History:  Procedure Laterality Date   SHOULDER ARTHROSCOPY WITH ROTATOR CUFF REPAIR AND SUBACROMIAL DECOMPRESSION Left 04/23/2023   Procedure: SHOULDER ARTHROSCOPY WITH ROTATOR CUFF DEBRIDEMENT VERSUS REPAIR  AND SUBACROMIAL DECOMPRESSION, OPEN BICEPS TENODESIS;  Surgeon: Jones Broom, MD;  Location: [facility];  Service: Orthopedics;  Laterality: Left;   Patient Active Problem List   Diagnosis Date Noted   OSA (obstructive sleep apnea) 11/11/2013    PCP: Rebecka Apley, NP  REFERRING PROVIDER: Jones Broom, MD  REFERRING DIAG: SHOULDER LONG HEAD BICEPS TENODESIS  THERAPY DIAG:  Muscle weakness (generalized)  Acute pain of left shoulder  Cramp and spasm  Other muscle spasm  Abnormal posture  Rationale for Evaluation and Treatment: Rehabilitation  ONSET DATE: 04/23/2023 surgery date, actual injury occurred in April 2024  SUBJECTIVE:                                                                                                                                                                                       SUBJECTIVE STATEMENT: Patient reports he is doing okay today. No new complaints  EVAL: Pt initially injured his left shoulder at work in April 2024.  After failed conservative treatment, he had to undergo left shoulder arthroscopic debridement for partial rotator cuff tear, SLAP tear and subacromial bursa with left shoulder decompression and left shoulder subpectoral biceps tenodesis by Dr Ave Filter on 04/23/2023.  Hand dominance:  Right  PERTINENT HISTORY: Left shoulder arthroscopic debridement for partial rotator cuff tear, SLAP tear and subacromial bursa with left shoulder decompression and left shoulder subpectoral biceps tenodesis by Dr Ave Filter on 04/23/2023 GERD, sleep apnea with CPAP  PAIN: 07/31/2023 Are you having pain? Yes: NPRS scale:  0/10 Pain location: left shoulder Pain description: sore, but if he moves wrong can be a quick/sharp 10/10 pain Aggravating factors: certain movements Relieving factors: rest  PRECAUTIONS: Other: Per MD:  okay for full A/ROM and P/ROM of shoulder, no resisted elbow flexion or supination  RED FLAGS: None   WEIGHT BEARING RESTRICTIONS: No  FALLS:  Has patient fallen in last 6 months? No   OCCUPATION: Works for [employer].  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  NEXT MD VISIT: Dr Ave Filter in November  OBJECTIVE:   DIAGNOSTIC FINDINGS:  Left shoulder MRI on 03/02/2023 (prior to  surgery): IMPRESSION: 1. Moderate tendinosis of the supraspinatus tendon with a small partial-thickness bursal surface tear along the anterior-most aspect and a small insertional interstitial tears of the posterior most aspect. 2. Severe tendinosis of the infraspinatus tendon. 3. Mild tendinosis of the intra-articular portion of the long head of the biceps tendon.  PATIENT SURVEYS:  FOTO 46 (projected 62 by visit 19)  06/25/23:   FOTO 57 (projected 62 by visit 19)  07/09/23:   FOTO 57.3  (projected 62 by visit 19)  COGNITION: Overall cognitive status: Within functional limits for tasks assessed     SENSATION: Denies numbness or tingling  POSTURE: Rounded shoulders, forward head  UPPER EXTREMITY ROM:   Active ROM Right eval Left eval Left 05/27/23 Left 06/25/23 Left 07/09/23  Shoulder flexion 170 63 148 seated/145 supine  148 active 155 passive WFL  Shoulder extension 50 35 43 na WFL  Shoulder abduction 165 56 62 seated/88 supine 158 active 170 passive WFL  Shoulder adduction       Shoulder internal rotation    68 active 70 passive WFL  Shoulder external rotation    73 active 80 passive WFL  (Blank rows = not tested)  UPPER EXTREMITY MMT:  Eval:   Right shoulder is WFL Left shoulder is at least 3- to 3/5  07/09/23: Left shoulder: flexion: 4-/5, abduction 4/5, IR 4+/5, ER 4/5  (pain with ER and abduction)    TODAY'S TREATMENT:     LEFT SHOULDER  DATE:  07/31/23 Lat Pull Down 45# 2 x 10  Seated Matrix Row 40# 2 x 10 Weighted pulls at cable column 40# x 10 Prone shoulder ext 2 x 10 with 5# left Prone shoulder row  2 x 10 with 5# left Prone shoulder horizontal abduction 2 x 10 5# left Side lying ER with 3# 2 x 10 with 3 sec hold at top and eccentric lower  Sleeper stretch 5 x 5 sec holds Standing ER/IR Red band B x  20  07/29/23 UBE L 2 x 6 min 20fwd/3bwd Standing shoulder flexion and scaption 2 x 10 with 5 lbs Prone drop n catch yellow plyo ball for scapular stabilization x 20 Prone shoulder ext 2 x 10 with 5# left Prone shoulder row  2 x 10 with 5# left Prone shoulder horizontal abduction 2 x 10 5# left Side lying ER with 3# 2 x 10 with 3 sec hold at top and eccentric lower  Seated bilateral ER  PNF yellow D1/2 flex/ext x 10 Standing ER/IR Red band B x 20 Standing shoulder flexion and scaption with 4 lbs 2 x 10 both Standing shoulder ABD 2# very difficult x 10  Seated eccentric biceps lowering with 6 lbs x 10, 8# x 10 3 way scapular stabilization with blue loop (left) 4D ball rolls x 20 with yellow plyo ball left Wall clocks with blue plyo ball x 10 (12 to 6 and back equals 1 rep) left L shoulder ER stretch in doorway 2x30 sec  07/23/23 UBE L 2 x 6 min 13fwd/3bwd PNF yellow D1/2 flex/ext x 10 Standing ER/IR Red band B x 20 Standing shoulder flexion and scaption with 4 lbs 2 x 10 both Standing shoulder ABD 2# very difficult x 10 Side lying ER with 3# 1 x 10 with 3 sec hold at top and eccentric lower  Prone shoulder ext 2 x 10 with 4# left Prone shoulder row  2 x 10 with 4# left Prone shoulder horizontal abduction 2 x 10 3# left Seated eccentric biceps lowering with 6 lbs x 10, 8# x 10 3 way scapular stabilization with blue loop (left) 4D ball rolls x 20 with yellow plyo ball left Wall clocks with blue plyo ball x 10 (12 to 6 and back equals 1 rep) left L shoulder ER stretch in doorway 2x30 sec  07/16/23 UBE L 2 x 6 min 64fwd/3bwd Standing shoulder flexion and scaption with 3 lbs 2 x 10 both Side lying ER with 3# 2 x 10 Prone shoulder ext 2 x 10 with 3# left Prone shoulder row  2 x 10 with 3# left Prone shoulder horizontal abduction 2 x 10 3# left Prone shoulder drop and catch 2 x 10 with blue plyo ball left Seated eccentric biceps lowering with 5 lbs x 10, 8# x 10 3 way scapular  stabilization with blue loop (left) 4D ball rolls x 20 with yellow plyo ball left Wall clocks with blue plyo ball x 7 (12 to 6 and back equals 1 rep) left Serratus push standing Blue band x 20 Standing ER Red band B x 20 L shoulder ER stretch in doorway 2x30 sec    PATIENT EDUCATION: Education details: Issued HEP Person educated: Patient Education method: Programmer, multimedia, Facilities manager, and Handouts Education comprehension: verbalized understanding and returned demonstration  HOME EXERCISE PROGRAM: Access Code: [email or web address] URL: [email or web address] Date: 05/15/2023 Prepared by: Reather Laurence  Exercises - Seated Scapular Retraction  - 1 x daily - 7 x weekly - 2 sets -  10 reps - Circular Shoulder Pendulum with Table Support  - 1 x daily - 7 x weekly - 2 sets - 10 reps - Seated Shoulder Abduction Towel Slide at Table Top  - 1 x daily - 7 x weekly - 2 sets - 10 reps - Seated Shoulder Flexion Towel Slide at Table Top  - 1 x daily - 7 x weekly - 2 sets - 10 reps - Shoulder Flexion Wall Slide with Towel  - 1 x daily - 7 x weekly - 2 sets - 10 reps - Standing Shoulder Abduction Slides at Wall  - 1 x daily - 7 x weekly - 2 sets - 10 reps  ASSESSMENT:  CLINICAL IMPRESSION: Rusty presented today with no new complaints. Incorporated more postural strengthening exercises and patient tolerated them well. Incorporated weighted pulls to simulate some work activities. Patient required verbal and visual cues for correct exercise performance. Patient will continue to benefit from skilled therapy to address ER weakness and simulate work activities.  OBJECTIVE IMPAIRMENTS: decreased ROM, decreased strength, increased muscle spasms, impaired flexibility, postural dysfunction, and pain.   ACTIVITY LIMITATIONS: carrying, lifting, sleeping, reach over head, and caring for others  PARTICIPATION LIMITATIONS: community activity, occupation, and yard work  PERSONAL FACTORS: Profession and Time  since onset of injury/illness/exacerbation are also affecting patient's functional outcome.   REHAB POTENTIAL: Good  CLINICAL DECISION MAKING: Stable/uncomplicated  EVALUATION COMPLEXITY: Low   GOALS: Goals reviewed with patient? Yes  SHORT TERM GOALS: Target date: 05/31/2023  Pt will be independent with initial HEP. Baseline: Goal status: MET  2.  Patient will increase left shoulder flexion and abduction A/ROM by 10 degrees to allow patient to reach into shelves. Baseline:  Goal status: MET 06/12/23   LONG TERM GOALS: Target date: 08/23/23  Patient to be independent with advanced HEP to allow for self progression post discharge. Baseline:  Goal status: IN PROGRESS 06/05/23  2.  Patient to increase left shoulder A/ROM to [facility] to allow him to reach overhead for job related tasks. Baseline: see above Goal status: MET 06/05/23  3.  Patient to increase left shoulder strength to at least 4+/5 to allow him to push/pull items as needed at work and to assist with transfers. Baseline: 3-/5 Goal status: In progress  4.  Patient to increase FOTO to at least 62 to demonstrate improvements in functional mobility. Baseline:  Goal status: In progress  5.  Patient able to demonstrate job simulated tasks in PT clinic without increased pain or to report ability to return to full duty at work without increased shoulder pain. Baseline:  Goal status: In progress    PLAN:  PT FREQUENCY: 1-2x/week  PT DURATION: 8 weeks  PLANNED INTERVENTIONS: Therapeutic exercises, Therapeutic activity, Neuromuscular re-education, Balance training, Gait training, Patient/Family education, Self Care, Joint mobilization, Joint manipulation, Aquatic Therapy, Dry Needling, Electrical stimulation, Spinal manipulation, Spinal mobilization, Cryotherapy, Moist heat, scar mobilization, Taping, Vasopneumatic device, Ultrasound, Ionotophoresis 4mg /ml Dexamethasone, Manual therapy, and Re-evaluation  PLAN FOR NEXT  SESSION:  continue work simulation activities: pulling as with using transfer pad to pull patient onto stretcher from bed, pushing as with pushing from other side of bed, raising stretcher, and loading stretcher. Continue post scope and tenodesis protocol.  ER strengthening, stability training at around 60-75 degrees of abduction.    Claude Manges, PT 07/31/23 11:39 AM [facility] [address] [place], [place] [address] Phone # [phone] Fax [phone]

## 2023-08-05 NOTE — Therapy (Signed)
OUTPATIENT PHYSICAL THERAPY SHOULDER TREATMENT    Patient Name: Bruce Simmons MRN: [id number] DOB:09-01-1976, 47 y.o., male Today's Date: 08/06/2023  END OF SESSION:  PT End of Session - 08/06/23 0851     Visit Number 18    Number of Visits 26    Date for PT Re-Evaluation 08/30/23    Authorization Type cone aetna    Authorization Time Period 26 visits now up until 12/16, so he will need a new auth for after 12/16    Authorization - Visit Number 18    Authorization - Number of Visits 26    PT Start Time 0849    PT Stop Time 0930    PT Time Calculation (min) 41 min    Activity Tolerance Patient tolerated treatment well    Behavior During Therapy WFL for tasks assessed/performed                    Past Medical History:  Diagnosis Date   Anxiety    GERD (gastroesophageal reflux disease)    Seasonal allergies    Sleep apnea    Sleep-related bruxism    Past Surgical History:  Procedure Laterality Date   SHOULDER ARTHROSCOPY WITH ROTATOR CUFF REPAIR AND SUBACROMIAL DECOMPRESSION Left 04/23/2023   Procedure: SHOULDER ARTHROSCOPY WITH ROTATOR CUFF DEBRIDEMENT VERSUS REPAIR  AND SUBACROMIAL DECOMPRESSION, OPEN BICEPS TENODESIS;  Surgeon: Jones Broom, MD;  Location: [facility];  Service: Orthopedics;  Laterality: Left;   Patient Active Problem List   Diagnosis Date Noted   OSA (obstructive sleep apnea) 11/11/2013    PCP: Rebecka Apley, NP  REFERRING PROVIDER: Jones Broom, MD  REFERRING DIAG: SHOULDER LONG HEAD BICEPS TENODESIS  THERAPY DIAG:  Muscle weakness (generalized)  Acute pain of left shoulder  Cramp and spasm  Other muscle spasm  Abnormal posture  Rationale for Evaluation and Treatment: Rehabilitation  ONSET DATE: 04/23/2023 surgery date, actual injury occurred in April 2024  SUBJECTIVE:                                                                                                                                                                                       SUBJECTIVE STATEMENT: Reports soreness in left shoulder. No biceps pain or soreness.  EVAL: Pt initially injured his left shoulder at work in April 2024.  After failed conservative treatment, he had to undergo left shoulder arthroscopic debridement for partial rotator cuff tear, SLAP tear and subacromial bursa with left shoulder decompression and left shoulder subpectoral biceps tenodesis by Dr Ave Filter on 04/23/2023.  Hand dominance: Right  PERTINENT HISTORY: Left shoulder arthroscopic debridement for partial rotator cuff tear, SLAP tear  and subacromial bursa with left shoulder decompression and left shoulder subpectoral biceps tenodesis by Dr Ave Filter on 04/23/2023 GERD, sleep apnea with CPAP  PAIN: 07/31/2023 Are you having pain? Yes: NPRS scale:  0/10 Pain location: left shoulder Pain description: sore, but if he moves wrong can be a quick/sharp 10/10 pain Aggravating factors: certain movements Relieving factors: rest  PRECAUTIONS: Other: Per MD:  okay for full A/ROM and P/ROM of shoulder, no resisted elbow flexion or supination  RED FLAGS: None   WEIGHT BEARING RESTRICTIONS: No  FALLS:  Has patient fallen in last 6 months? No   OCCUPATION: Works for [employer].  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  NEXT MD VISIT: Dr Ave Filter in November  OBJECTIVE:   DIAGNOSTIC FINDINGS:  Left shoulder MRI on 03/02/2023 (prior to surgery): IMPRESSION: 1. Moderate tendinosis of the supraspinatus  tendon with a small partial-thickness bursal surface tear along the anterior-most aspect and a small insertional interstitial tears of the posterior most aspect. 2. Severe tendinosis of the infraspinatus tendon. 3. Mild tendinosis of the intra-articular portion of the long head of the biceps tendon.  PATIENT SURVEYS:  FOTO 46 (projected 62 by visit 19)  06/25/23:   FOTO 57 (projected 62 by visit 19)  07/09/23:   FOTO 57.3  (projected 62 by visit 19)  COGNITION: Overall cognitive status: Within functional limits for tasks assessed     SENSATION: Denies numbness or tingling  POSTURE: Rounded shoulders, forward head  UPPER EXTREMITY ROM:   Active ROM Right eval Left eval Left 05/27/23 Left 06/25/23 Left 07/09/23  Shoulder flexion 170 63 148 seated/145 supine  148 active 155 passive WFL  Shoulder extension 50 35 43 na WFL  Shoulder abduction 165 56 62 seated/88 supine 158 active 170 passive WFL  Shoulder adduction       Shoulder internal rotation    68 active 70 passive WFL  Shoulder external rotation    73 active 80 passive WFL  (Blank rows = not tested)  UPPER EXTREMITY MMT:  Eval:   Right shoulder is WFL Left shoulder is at least 3- to 3/5  07/09/23: Left shoulder: flexion: 4-/5, abduction 4/5, IR 4+/5, ER 4/5  (pain with ER and abduction)    TODAY'S TREATMENT:     LEFT SHOULDER                                                                                                                                    DATE:  08/06/23 UBE L 4.5 x 6 min Lat Pull Down 45# 2 x 10  Seated Matrix Row 40# 2 x 10 Weighted pulls at cable column 40# x 10 Simulated Raising stretcher - using power bands Pushing using medium magenta serious steel power band x 20 Prone shoulder ext 2 x 10 with 5# left Prone shoulder row  2 x 10 with 5# left Prone shoulder horizontal abduction 2 x 10 5# left Side lying ER with 3# 1 x 10 with 3 sec hold at top and eccentric lower - burning in ant  shoulder Supine rhythmic stab ER/IR in 90 deg elbow flexion and shoulder ABD - some pain with ER. Standing ER/IR Red band B x 20 Iontophoresis with 1.0 mL 4mg /ml Dexamethasone - 4-6 hour patch (16mA-min) to L ant shoulder (patch #1 of 6 - this round)   07/31/23 Lat Pull Down 45# 2 x 10  Seated Matrix Row 40# 2 x 10 Weighted pulls at cable column 40# x 10 Prone shoulder ext 2 x 10 with 5# left Prone shoulder row  2 x 10 with 5# left Prone shoulder horizontal abduction 2 x 10 5# left Side lying ER with 3# 2 x 10 with 3 sec hold at top and eccentric lower  Sleeper stretch 5 x 5 sec holds Standing ER/IR Red band B x 20  07/29/23 UBE L 2 x 6 min 63fwd/3bwd Standing shoulder flexion and scaption 2 x 10 with 5 lbs Prone drop n catch yellow plyo ball for scapular stabilization x 20 Prone shoulder ext 2 x 10 with 5# left Prone shoulder row  2 x 10 with 5# left Prone shoulder horizontal abduction 2 x 10 5# left Side lying ER with 3# 2 x 10 with 3 sec hold at top and eccentric lower  Seated bilateral ER  PNF yellow D1/2 flex/ext x 10 Standing ER/IR Red band B x 20 Standing shoulder flexion and scaption with 4 lbs 2 x 10 both Standing shoulder ABD 2# very difficult x 10  Seated eccentric biceps lowering with 6 lbs x 10, 8# x 10 3 way scapular stabilization with blue loop (left) 4D ball rolls x 20 with yellow plyo ball left Wall clocks with blue plyo ball x 10 (12 to 6 and back equals 1 rep) left L shoulder ER stretch in doorway 2x30 sec  07/23/23 UBE L 2 x 6 min 41fwd/3bwd PNF yellow D1/2 flex/ext x 10 Standing ER/IR Red band B x 20 Standing shoulder flexion and scaption with 4 lbs 2 x 10 both Standing shoulder ABD 2# very difficult x 10 Side lying ER with 3# 1 x 10 with 3 sec hold at top and eccentric lower  Prone shoulder ext 2 x 10 with 4# left Prone shoulder row  2 x 10 with 4# left Prone shoulder horizontal abduction 2 x 10 3# left Seated eccentric biceps lowering with 6 lbs x  10, 8# x 10 3 way scapular stabilization with blue loop (left) 4D ball rolls x 20 with yellow plyo ball left Wall clocks with blue plyo ball x 10 (12 to 6 and back equals 1 rep) left L shoulder ER stretch in doorway 2x30 sec   PATIENT EDUCATION: Education details: Issued HEP Person educated: Patient Education method: Explanation, Demonstration, and Handouts Education comprehension: verbalized understanding and returned demonstration  HOME EXERCISE PROGRAM: Access Code: [email or web address] URL: [email or web address] Date: 05/15/2023 Prepared by: Reather Laurence  Exercises - Seated Scapular Retraction  - 1 x daily - 7 x weekly -  2 sets - 10 reps - Circular Shoulder Pendulum with Table Support  - 1 x daily - 7 x weekly - 2 sets - 10 reps - Seated Shoulder Abduction Towel Slide at Table Top  - 1 x daily - 7 x weekly - 2 sets - 10 reps - Seated Shoulder Flexion Towel Slide at Table Top  - 1 x daily - 7 x weekly - 2 sets - 10 reps - Shoulder Flexion Wall Slide with Towel  - 1 x daily - 7 x weekly - 2 sets - 10 reps - Standing Shoulder Abduction Slides at Wall  - 1 x daily - 7 x weekly - 2 sets - 10 reps  ASSESSMENT:  CLINICAL IMPRESSION: Patient reports some soreness in his shoulder today and reported increased pulling and discomfort with certain movements. He could do seated rows with no problem, but active shoulder flexion followed by row motion was painful. Also supine 90/90 ER and S/L ER were painful anteriorly. Patient has had relief with iontophoresis previously, so this was applied today to decrease any inflammation. Otherwise, patient tolerated TE well.   OBJECTIVE IMPAIRMENTS: decreased ROM, decreased strength, increased muscle spasms, impaired flexibility, postural dysfunction, and pain.   ACTIVITY LIMITATIONS: carrying, lifting, sleeping, reach over head, and caring for others  PARTICIPATION LIMITATIONS: community activity, occupation, and yard work  PERSONAL FACTORS:  Profession and Time since onset of injury/illness/exacerbation are also affecting patient's functional outcome.   REHAB POTENTIAL: Good  CLINICAL DECISION MAKING: Stable/uncomplicated  EVALUATION COMPLEXITY: Low   GOALS: Goals reviewed with patient? Yes  SHORT TERM GOALS: Target date: 05/31/2023  Pt will be independent with initial HEP. Baseline: Goal status: MET  2.  Patient will increase left shoulder flexion and abduction A/ROM by 10 degrees to allow patient to reach into shelves. Baseline:  Goal status: MET 06/12/23   LONG TERM GOALS: Target date: 08/23/23  Patient to be independent with advanced HEP to allow for self progression post discharge. Baseline:  Goal status: IN PROGRESS 06/05/23  2.  Patient to increase left shoulder A/ROM to [facility] to allow him to reach overhead for job related tasks. Baseline: see above Goal status: MET 06/05/23  3.  Patient to increase left shoulder strength to at least 4+/5 to allow him to push/pull items as needed at work and to assist with transfers. Baseline: 3-/5 Goal status: In progress  4.  Patient to increase FOTO to at least 62 to demonstrate improvements in functional mobility. Baseline:  Goal status: In progress  5.  Patient able to demonstrate job simulated tasks in PT clinic without increased pain or to report ability to return to full duty at work without increased shoulder pain. Baseline:  Goal status: In progress    PLAN:  PT FREQUENCY: 1-2x/week  PT DURATION: 8 weeks  PLANNED INTERVENTIONS: Therapeutic exercises, Therapeutic activity, Neuromuscular re-education, Balance training, Gait training, Patient/Family education, Self Care, Joint mobilization, Joint manipulation, Aquatic Therapy, Dry Needling, Electrical stimulation, Spinal manipulation, Spinal mobilization, Cryotherapy, Moist heat, scar mobilization, Taping, Vasopneumatic device, Ultrasound, Ionotophoresis 4mg /ml Dexamethasone, Manual therapy, and  Re-evaluation  PLAN FOR NEXT SESSION:  continue work simulation activities: pulling as with using transfer pad to pull patient onto stretcher from bed, pushing as with pushing from other side of bed, raising stretcher, and loading stretcher. Continue post scope and tenodesis protocol.  ER strengthening, stability training at around 60-75 degrees of abduction.    Solon Palm, PT  08/06/23 9:33 AM [facility] [address]  [address] [place], [place] [address] Phone # [phone] Fax [phone]

## 2023-08-06 ENCOUNTER — Ambulatory Visit: Payer: PRIVATE HEALTH INSURANCE | Attending: Orthopedic Surgery | Admitting: Physical Therapy

## 2023-08-06 ENCOUNTER — Encounter: Payer: Self-pay | Admitting: Physical Therapy

## 2023-08-06 DIAGNOSIS — R293 Abnormal posture: Secondary | ICD-10-CM | POA: Diagnosis present

## 2023-08-06 DIAGNOSIS — M62838 Other muscle spasm: Secondary | ICD-10-CM | POA: Diagnosis present

## 2023-08-06 DIAGNOSIS — M6281 Muscle weakness (generalized): Secondary | ICD-10-CM | POA: Insufficient documentation

## 2023-08-06 DIAGNOSIS — M25512 Pain in left shoulder: Secondary | ICD-10-CM | POA: Diagnosis present

## 2023-08-06 DIAGNOSIS — R252 Cramp and spasm: Secondary | ICD-10-CM | POA: Diagnosis present

## 2023-08-09 ENCOUNTER — Ambulatory Visit: Payer: PRIVATE HEALTH INSURANCE | Admitting: Physical Therapy

## 2023-08-12 NOTE — Therapy (Incomplete)
OUTPATIENT PHYSICAL THERAPY SHOULDER TREATMENT    Patient Name: Bruce Simmons MRN: [id number] DOB:[birth year], 47 y.o., male Today's Date: 08/12/2023  END OF SESSION:           Past Medical History:  Diagnosis Date   Anxiety    GERD (gastroesophageal reflux disease)    Seasonal allergies    Sleep apnea    Sleep-related bruxism    Past Surgical History:  Procedure Laterality Date   SHOULDER ARTHROSCOPY WITH ROTATOR CUFF REPAIR AND SUBACROMIAL DECOMPRESSION Left 04/23/2023   Procedure: SHOULDER ARTHROSCOPY WITH ROTATOR CUFF DEBRIDEMENT VERSUS REPAIR  AND SUBACROMIAL DECOMPRESSION, OPEN BICEPS TENODESIS;  Surgeon: Jones Broom, MD;  Location: [facility];  Service: Orthopedics;  Laterality: Left;   Patient Active Problem List   Diagnosis Date Noted   OSA (obstructive sleep apnea) 11/11/2013    PCP: Rebecka Apley, NP  REFERRING PROVIDER: Jones Broom, MD  REFERRING DIAG: SHOULDER LONG HEAD BICEPS TENODESIS  THERAPY DIAG:  No diagnosis found.  Rationale for Evaluation and Treatment: Rehabilitation  ONSET DATE: 04/23/2023 surgery date, actual injury occurred in April 2024  SUBJECTIVE:                                                                                                                                                                                      SUBJECTIVE STATEMENT: ***  EVAL: Pt initially injured his left shoulder at work in April 2024.  After failed conservative treatment, he had to undergo left shoulder arthroscopic debridement for partial rotator cuff tear, SLAP tear and subacromial bursa with left shoulder decompression and left shoulder subpectoral biceps tenodesis by Dr Ave Filter on 04/23/2023.  Hand dominance: Right  PERTINENT HISTORY: Left shoulder arthroscopic debridement for partial rotator cuff tear, SLAP tear and subacromial bursa with left shoulder decompression and left shoulder subpectoral biceps  tenodesis by Dr Ave Filter on 04/23/2023 GERD, sleep apnea with CPAP  PAIN: 07/31/2023 Are you having pain? Yes: NPRS scale:  0/10 Pain location: left shoulder Pain description: sore, but if he moves wrong can be a quick/sharp 10/10 pain Aggravating factors: certain movements Relieving factors: rest  PRECAUTIONS: Other: Per MD:  okay for full A/ROM and P/ROM of shoulder, no resisted elbow flexion or supination  RED FLAGS: None   WEIGHT BEARING RESTRICTIONS: No  FALLS:  Has patient fallen in last 6 months? No   OCCUPATION: Works for [employer].  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  NEXT MD VISIT: Dr Ave Filter in November  OBJECTIVE:   DIAGNOSTIC FINDINGS:  Left shoulder MRI on 03/02/2023 (prior to surgery): IMPRESSION: 1. Moderate tendinosis of the supraspinatus tendon with a small partial-thickness bursal surface tear along the anterior-most aspect and a small insertional interstitial tears of the posterior most aspect. 2. Severe tendinosis of the infraspinatus tendon. 3. Mild tendinosis of the intra-articular portion of the long head of the biceps tendon.  PATIENT SURVEYS:  FOTO 46 (projected 62 by visit 19)  06/25/23:   FOTO 57 (projected 62 by visit 19)  07/09/23:   FOTO 57.3  (projected 62 by visit 19)  COGNITION: Overall cognitive status: Within functional limits for tasks assessed     SENSATION: Denies numbness or tingling  POSTURE: Rounded shoulders, forward head  UPPER EXTREMITY ROM:   Active ROM Right eval  Left eval Left 05/27/23 Left 06/25/23 Left 07/09/23  Shoulder flexion 170 63 148 seated/145 supine  148 active 155 passive WFL  Shoulder extension 50 35 43 na WFL  Shoulder abduction 165 56 62 seated/88 supine 158 active 170 passive WFL  Shoulder adduction       Shoulder internal rotation    68 active 70 passive WFL  Shoulder external rotation    73 active 80 passive WFL  (Blank rows = not tested)  UPPER EXTREMITY MMT:  Eval:   Right shoulder is WFL Left shoulder is at least 3- to 3/5  07/09/23: Left shoulder: flexion: 4-/5, abduction 4/5, IR 4+/5, ER 4/5  (pain with ER and abduction)    TODAY'S TREATMENT:     LEFT SHOULDER                                                                                                                                    DATE:  08/13/23 UBE L 4.5 x 6 min Lat Pull Down 45# 2 x 10  Seated Matrix Row 40# 2 x 10 Weighted pulls at cable column 40# x 10 Simulated Raising stretcher - using power bands Pushing using medium magenta serious steel power band x 20 Prone shoulder ext 2 x 10 with 5# left Prone shoulder row  2 x 10 with 5# left Prone shoulder horizontal abduction 2 x 10 5# left Side lying ER with 3# 1 x 10 with 3 sec hold at top and eccentric lower - burning in ant shoulder Supine rhythmic stab ER/IR in 90 deg elbow flexion and shoulder ABD - some pain with ER. Standing ER/IR Red band B x 20 Iontophoresis with 1.0 mL 4mg /ml Dexamethasone - 4-6 hour patch (52mA-min) to L ant shoulder (patch #1 of 6 - this round)  08/06/23 UBE L 4.5 x 6 min Lat Pull  Down 45# 2 x 10  Seated Matrix Row 40# 2 x 10 Weighted pulls at cable column 40# x 10 Simulated Raising stretcher - using power bands Pushing using medium magenta serious steel power band x 20 Prone shoulder ext 2 x 10 with 5# left Prone shoulder row  2 x 10 with 5# left Prone shoulder horizontal abduction 2 x 10 5# left Side lying ER with 3# 1 x 10 with 3 sec hold at top and eccentric  lower - burning in ant shoulder Supine rhythmic stab ER/IR in 90 deg elbow flexion and shoulder ABD - some pain with ER. Standing ER/IR Red band B x 20 Iontophoresis with 1.0 mL 4mg /ml Dexamethasone - 4-6 hour patch (29mA-min) to L ant shoulder (patch #1 of 6 - this round)   07/31/23 Lat Pull Down 45# 2 x 10  Seated Matrix Row 40# 2 x 10 Weighted pulls at cable column 40# x 10 Prone shoulder ext 2 x 10 with 5# left Prone shoulder row  2 x 10 with 5# left Prone shoulder horizontal abduction 2 x 10 5# left Side lying ER with 3# 2 x 10 with 3 sec hold at top and eccentric lower  Sleeper stretch 5 x 5 sec holds Standing ER/IR Red band B x 20  07/29/23 UBE L 2 x 6 min 58fwd/3bwd Standing shoulder flexion and scaption 2 x 10 with 5 lbs Prone drop n catch yellow plyo ball for scapular stabilization x 20 Prone shoulder ext 2 x 10 with 5# left Prone shoulder row  2 x 10 with 5# left Prone shoulder horizontal abduction 2 x 10 5# left Side lying ER with 3# 2 x 10 with 3 sec hold at top and eccentric lower  Seated bilateral ER  PNF yellow D1/2 flex/ext x 10 Standing ER/IR Red band B x 20 Standing shoulder flexion and scaption with 4 lbs 2 x 10 both Standing shoulder ABD 2# very difficult x 10  Seated eccentric biceps lowering with 6 lbs x 10, 8# x 10 3 way scapular stabilization with blue loop (left) 4D ball rolls x 20 with yellow plyo ball left Wall clocks with blue plyo ball x 10 (12 to 6 and back equals 1 rep) left L shoulder ER stretch in doorway 2x30 sec  07/23/23 UBE L 2 x 6 min 11fwd/3bwd PNF yellow D1/2 flex/ext x 10 Standing ER/IR Red band B x 20 Standing shoulder flexion and scaption with 4 lbs 2 x 10 both Standing shoulder ABD 2# very difficult x 10 Side lying ER with 3# 1 x 10 with 3 sec hold at top and eccentric lower  Prone shoulder ext 2 x 10 with 4# left Prone shoulder row  2 x 10 with 4# left Prone shoulder horizontal abduction 2 x 10 3# left Seated eccentric biceps  lowering with 6 lbs x 10, 8# x 10 3 way scapular stabilization with blue loop (left) 4D ball rolls x 20 with yellow plyo ball left Wall clocks with blue plyo ball x 10 (12 to 6 and back equals 1 rep) left L shoulder ER stretch in doorway 2x30 sec   PATIENT EDUCATION: Education details: Issued HEP Person educated: Patient Education method: Explanation, Demonstration, and Handouts Education comprehension: verbalized understanding and returned demonstration  HOME EXERCISE PROGRAM: Access Code: [email or web address] URL: [email or web address] Date: 05/15/2023 Prepared by: Reather Laurence  Exercises - Seated Scapular Retraction  - 1 x daily - 7 x weekly - 2 sets - 10 reps - Circular Shoulder Pendulum  with Table Support  - 1 x daily - 7 x weekly - 2 sets - 10 reps - Seated Shoulder Abduction Towel Slide at Table Top  - 1 x daily - 7 x weekly - 2 sets - 10 reps - Seated Shoulder Flexion Towel Slide at Table Top  - 1 x daily - 7 x weekly - 2 sets - 10 reps - Shoulder Flexion Wall Slide with Towel  - 1 x daily - 7 x weekly - 2 sets - 10 reps - Standing Shoulder Abduction Slides at Wall  - 1 x daily - 7 x weekly - 2 sets - 10 reps  ASSESSMENT:  CLINICAL IMPRESSION: ***  OBJECTIVE IMPAIRMENTS: decreased ROM, decreased strength, increased muscle spasms, impaired flexibility, postural dysfunction, and pain.   ACTIVITY LIMITATIONS: carrying, lifting, sleeping, reach over head, and caring for others  PARTICIPATION LIMITATIONS: community activity, occupation, and yard work  PERSONAL FACTORS: Profession and Time since onset of injury/illness/exacerbation are also affecting patient's functional outcome.   REHAB POTENTIAL: Good  CLINICAL DECISION MAKING: Stable/uncomplicated  EVALUATION COMPLEXITY: Low   GOALS: Goals reviewed with patient? Yes  SHORT TERM GOALS: Target date: 05/31/2023  Pt will be independent with initial HEP. Baseline: Goal status: MET  2.  Patient will increase  left shoulder flexion and abduction A/ROM by 10 degrees to allow patient to reach into shelves. Baseline:  Goal status: MET 06/12/23   LONG TERM GOALS: Target date: 08/23/23  Patient to be independent with advanced HEP to allow for self progression post discharge. Baseline:  Goal status: IN PROGRESS 06/05/23  2.  Patient to increase left shoulder A/ROM to [facility] to allow him to reach overhead for job related tasks. Baseline: see above Goal status: MET 06/05/23  3.  Patient to increase left shoulder strength to at least 4+/5 to allow him to push/pull items as needed at work and to assist with transfers. Baseline: 3-/5 Goal status: In progress  4.  Patient to increase FOTO to at least 62 to demonstrate improvements in functional mobility. Baseline:  Goal status: In progress  5.  Patient able to demonstrate job simulated tasks in PT clinic without increased pain or to report ability to return to full duty at work without increased shoulder pain. Baseline:  Goal status: In progress    PLAN:  PT FREQUENCY: 1-2x/week  PT DURATION: 8 weeks  PLANNED INTERVENTIONS: Therapeutic exercises, Therapeutic activity, Neuromuscular re-education, Balance training, Gait training, Patient/Family education, Self Care, Joint mobilization, Joint manipulation, Aquatic Therapy, Dry Needling, Electrical stimulation, Spinal manipulation, Spinal mobilization, Cryotherapy, Moist heat, scar mobilization, Taping, Vasopneumatic device, Ultrasound, Ionotophoresis 4mg /ml Dexamethasone, Manual therapy, and Re-evaluation  PLAN FOR NEXT SESSION:  continue work simulation activities: pulling as with using transfer pad to pull patient onto stretcher from bed, pushing as with pushing from other side of bed, raising stretcher, and loading stretcher. Continue post scope and tenodesis protocol.  ER strengthening, stability training at around 60-75 degrees of abduction.    Solon Palm, PT  08/12/23 8:04 AM [facility] [address] [place], [place] [address] Phone # [phone] Fax [phone]

## 2023-08-13 ENCOUNTER — Ambulatory Visit: Payer: PRIVATE HEALTH INSURANCE | Admitting: Physical Therapy

## 2023-08-13 ENCOUNTER — Ambulatory Visit: Payer: Commercial Managed Care - PPO | Admitting: Physical Therapy

## 2023-08-13 ENCOUNTER — Encounter: Payer: Self-pay | Admitting: Physical Therapy

## 2023-08-13 DIAGNOSIS — M6281 Muscle weakness (generalized): Secondary | ICD-10-CM

## 2023-08-13 DIAGNOSIS — R252 Cramp and spasm: Secondary | ICD-10-CM

## 2023-08-13 DIAGNOSIS — M62838 Other muscle spasm: Secondary | ICD-10-CM

## 2023-08-13 DIAGNOSIS — R293 Abnormal posture: Secondary | ICD-10-CM

## 2023-08-13 DIAGNOSIS — M25512 Pain in left shoulder: Secondary | ICD-10-CM

## 2023-08-13 NOTE — Therapy (Signed)
OUTPATIENT PHYSICAL THERAPY SHOULDER TREATMENT    Patient Name: Bruce Simmons MRN: [id number] DOB:[birth year], 47 y.o., male Today's Date: 08/13/2023  END OF SESSION:  PT End of Session - 08/13/23 1659     Visit Number 19    Number of Visits 26    Date for PT Re-Evaluation 08/30/23    Authorization Type [employer]    Authorization Time Period 26 visits now up until 12/16, so he will need a new auth for after 12/16    Authorization - Visit Number 19    Authorization - Number of Visits 26    Progress Note Due on Visit 26    PT Start Time 1611    PT Stop Time 1655    PT Time Calculation (min) 44 min    Activity Tolerance Patient tolerated treatment well    Behavior During Therapy WFL for tasks assessed/performed                     Past Medical History:  Diagnosis Date   Anxiety    GERD (gastroesophageal reflux disease)    Seasonal allergies    Sleep apnea    Sleep-related bruxism    Past Surgical History:  Procedure Laterality Date   SHOULDER ARTHROSCOPY WITH ROTATOR CUFF REPAIR AND SUBACROMIAL DECOMPRESSION Left 04/23/2023   Procedure: SHOULDER ARTHROSCOPY WITH ROTATOR CUFF DEBRIDEMENT VERSUS REPAIR  AND SUBACROMIAL DECOMPRESSION, OPEN BICEPS TENODESIS;  Surgeon: Jones Broom, MD;  Location: [facility];  Service: Orthopedics;  Laterality: Left;   Patient Active Problem List   Diagnosis Date Noted   OSA (obstructive sleep apnea) 11/11/2013    PCP: Rebecka Apley, NP  REFERRING PROVIDER: Jones Broom, MD  REFERRING DIAG: SHOULDER LONG HEAD BICEPS TENODESIS  THERAPY DIAG:  Muscle weakness (generalized)  Cramp and spasm  Other muscle spasm  Acute pain of left shoulder  Abnormal posture  Rationale for Evaluation and Treatment: Rehabilitation  ONSET DATE: 04/23/2023 surgery date, actual injury occurred in April 2024  SUBJECTIVE:                                                                                                                                                                                       SUBJECTIVE STATEMENT: Patient reports he is doing good today. No new complaints  EVAL: Pt initially injured his left shoulder at work in April 2024.  After failed conservative treatment, he had to undergo left shoulder arthroscopic debridement for partial rotator cuff tear, SLAP tear and subacromial bursa with left shoulder decompression and left shoulder subpectoral biceps tenodesis by Dr Ave Filter on 04/23/2023.  Hand dominance: Right  PERTINENT HISTORY: Left  shoulder arthroscopic debridement for partial rotator cuff tear, SLAP tear and subacromial bursa with left shoulder decompression and left shoulder subpectoral biceps tenodesis by Dr Ave Filter on 04/23/2023 GERD, sleep apnea with CPAP  PAIN: 08/13/2023 Are you having pain? Yes: NPRS scale:  0/10 Pain location: left shoulder Pain description: sore, but if he moves wrong can be a quick/sharp 10/10 pain Aggravating factors: certain movements Relieving factors: rest  PRECAUTIONS: Other: Per MD:  okay for full A/ROM and P/ROM of shoulder, no resisted elbow flexion or supination  RED FLAGS: None   WEIGHT BEARING RESTRICTIONS: No  FALLS:  Has patient fallen in last 6 months? No   OCCUPATION: Works for [employer].  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  NEXT MD VISIT: Dr Ave Filter in November  OBJECTIVE:   DIAGNOSTIC FINDINGS:  Left shoulder MRI on 03/02/2023 (prior to surgery): IMPRESSION: 1. Moderate  tendinosis of the supraspinatus tendon with a small partial-thickness bursal surface tear along the anterior-most aspect and a small insertional interstitial tears of the posterior most aspect. 2. Severe tendinosis of the infraspinatus tendon. 3. Mild tendinosis of the intra-articular portion of the long head of the biceps tendon.  PATIENT SURVEYS:  FOTO 46 (projected 62 by visit 19)  06/25/23:   FOTO 57 (projected 62 by visit 19)  07/09/23:   FOTO 57.3  (projected 62 by visit 19)  COGNITION: Overall cognitive status: Within functional limits for tasks assessed     SENSATION: Denies numbness or tingling  POSTURE: Rounded shoulders, forward head  UPPER EXTREMITY ROM:   Active ROM Right eval Left eval Left 05/27/23 Left 06/25/23 Left 07/09/23  Shoulder flexion 170 63 148 seated/145 supine  148 active 155 passive WFL  Shoulder extension 50 35 43 na WFL  Shoulder abduction 165 56 62 seated/88 supine 158 active 170 passive WFL  Shoulder adduction       Shoulder internal rotation    68 active 70 passive WFL  Shoulder external rotation    73 active 80 passive WFL  (Blank rows = not tested)  UPPER EXTREMITY MMT:  Eval:   Right shoulder is WFL Left shoulder is at least 3- to 3/5  07/09/23: Left shoulder: flexion: 4-/5, abduction 4/5, IR 4+/5, ER 4/5  (pain with ER and abduction)    TODAY'S TREATMENT:     LEFT SHOULDER  DATE:  08/13/23 UBE L 4.5 x 6 min Lat Pull Down 60# 2 x 15  Seated Matrix Row 45# 2 x 10 Low Row at cable column 40# 2 x 10 Bent Over Row with barbell 90# 2 x 10 Simulated Raising stretcher with 30# barbell 2 x 10 Push/ Pull with Box on elevated plinth 30# x 20 Prone shoulder ext 2 x 10 with 5# left Prone shoulder row  2 x 10 with 5# left Prone shoulder horizontal abduction 2 x 10 5# left Side lying ER with 3# 1 x 10   Standing shoulder IR stretch with towel x 10 Standing ER/IR green band B x 20 Single Arm Bent Row 20# KB 2 x 10 Box Pushes with 25# x 3 laps   08/06/23 UBE L 4.5 x 6 min Lat Pull Down 45# 2 x 10  Seated Matrix Row 40# 2 x 10 Weighted pulls at cable column 40# x 10 Simulated Raising stretcher - using power bands Pushing using medium magenta serious steel power band x 20 Prone shoulder ext 2 x 10 with 5# left Prone shoulder row  2 x 10 with 5# left Prone shoulder horizontal abduction 2 x 10 5# left Side lying ER with 3# 1 x 10 with 3 sec hold at top and eccentric lower - burning in ant shoulder Supine rhythmic stab ER/IR in 90 deg elbow flexion and shoulder ABD - some pain with ER. Standing ER/IR Red band B x 20 Iontophoresis with 1.0 mL 4mg /ml Dexamethasone - 4-6 hour patch (51mA-min) to L ant shoulder (patch #1 of 6 - this round)   07/31/23 Lat Pull Down 45# 2 x 10  Seated Matrix Row 40# 2 x 10 Weighted pulls at cable column 40# x 10 Prone shoulder ext 2 x 10 with 5# left Prone shoulder row  2 x 10 with 5# left Prone shoulder horizontal abduction 2 x 10 5# left Side lying ER with 3# 2 x 10 with 3 sec hold at top and eccentric lower  Sleeper stretch 5 x 5 sec holds Standing ER/IR Red band B x 20   PATIENT EDUCATION: Education details: Issued HEP Person educated: Patient Education method: Explanation, Facilities manager, and Handouts Education comprehension: verbalized understanding and returned demonstration  HOME EXERCISE PROGRAM: Access Code: [email or web address] URL: [email or web address] Date: 05/15/2023 Prepared by: Clydie Braun Menke  Exercises - Seated Scapular Retraction  - 1 x daily - 7 x weekly - 2 sets - 10 reps - Circular Shoulder Pendulum with Table Support  - 1 x daily - 7 x weekly - 2 sets - 10 reps - Seated Shoulder Abduction Towel Slide at Table Top  - 1 x daily - 7 x weekly - 2 sets - 10 reps - Seated Shoulder Flexion Towel Slide at Table Top  - 1 x daily -  7 x weekly - 2 sets - 10 reps - Shoulder Flexion Wall Slide with Towel  - 1 x daily - 7 x weekly - 2 sets - 10 reps - Standing Shoulder Abduction Slides at Wall  - 1 x daily - 7 x weekly - 2 sets - 10 reps  ASSESSMENT:  CLINICAL IMPRESSION: Today's treatment session focused on more work simulated activities. Rusty tolerated treatment session well and did not verbalized any increased pain or discomfort. He is still very limited in Lt shoulder IR and external rotation. He verbalized some muscle fatigue with increased weight with work simulation. Patient will benefit from skilled PT to address the below impairments and improve  overall function.   OBJECTIVE IMPAIRMENTS: decreased ROM, decreased strength, increased muscle spasms, impaired flexibility, postural dysfunction, and pain.   ACTIVITY LIMITATIONS: carrying, lifting, sleeping, reach over head, and caring for others  PARTICIPATION LIMITATIONS: community activity, occupation, and yard work  PERSONAL FACTORS: Profession and Time since onset of injury/illness/exacerbation are also affecting patient's functional outcome.   REHAB POTENTIAL: Good  CLINICAL DECISION MAKING: Stable/uncomplicated  EVALUATION COMPLEXITY: Low   GOALS: Goals reviewed with patient? Yes  SHORT TERM GOALS: Target date: 05/31/2023  Pt will be independent with initial HEP. Baseline: Goal status: MET  2.  Patient will increase left shoulder flexion and abduction A/ROM by 10 degrees to allow patient to reach into shelves. Baseline:  Goal status: MET 06/12/23   LONG TERM GOALS: Target date: 08/23/23  Patient to be independent with advanced HEP to allow for self progression post discharge. Baseline:  Goal status: IN PROGRESS 06/05/23  2.  Patient to increase left shoulder A/ROM to [facility] to allow him to reach overhead for job related tasks. Baseline: see above Goal status: MET 06/05/23  3.  Patient to increase left shoulder strength to at least 4+/5 to allow  him to push/pull items as needed at work and to assist with transfers. Baseline: 3-/5 Goal status: In progress  4.  Patient to increase FOTO to at least 62 to demonstrate improvements in functional mobility. Baseline:  Goal status: In progress  5.  Patient able to demonstrate job simulated tasks in PT clinic without increased pain or to report ability to return to full duty at work without increased shoulder pain. Baseline:  Goal status: In progress    PLAN:  PT FREQUENCY: 1-2x/week  PT DURATION: 8 weeks  PLANNED INTERVENTIONS: Therapeutic exercises, Therapeutic activity, Neuromuscular re-education, Balance training, Gait training, Patient/Family education, Self Care, Joint mobilization, Joint manipulation, Aquatic Therapy, Dry Needling, Electrical stimulation, Spinal manipulation, Spinal mobilization, Cryotherapy, Moist heat, scar mobilization, Taping, Vasopneumatic device, Ultrasound, Ionotophoresis 4mg /ml Dexamethasone, Manual therapy, and Re-evaluation  PLAN FOR NEXT SESSION:  continue work simulation activities: pulling as with using transfer pad to pull patient onto stretcher from bed, pushing as with pushing from other side of bed, raising stretcher, and loading stretcher. Continue post scope and tenodesis protocol.  ER strengthening, stability training at around 60-75 degrees of abduction.    Claude Manges, PT 08/13/23 5:00 PM [facility] [address], [address] [place], [place] [address] Phone # [phone] Fax [phone]

## 2023-08-15 ENCOUNTER — Ambulatory Visit: Payer: PRIVATE HEALTH INSURANCE

## 2023-08-19 ENCOUNTER — Encounter: Payer: Self-pay | Admitting: Physical Therapy

## 2023-08-19 ENCOUNTER — Ambulatory Visit: Payer: PRIVATE HEALTH INSURANCE | Attending: Orthopedic Surgery | Admitting: Physical Therapy

## 2023-08-19 DIAGNOSIS — R252 Cramp and spasm: Secondary | ICD-10-CM | POA: Diagnosis present

## 2023-08-19 DIAGNOSIS — M62838 Other muscle spasm: Secondary | ICD-10-CM | POA: Diagnosis present

## 2023-08-19 DIAGNOSIS — M6281 Muscle weakness (generalized): Secondary | ICD-10-CM | POA: Diagnosis present

## 2023-08-19 DIAGNOSIS — R293 Abnormal posture: Secondary | ICD-10-CM | POA: Diagnosis present

## 2023-08-19 DIAGNOSIS — M25512 Pain in left shoulder: Secondary | ICD-10-CM | POA: Diagnosis present

## 2023-08-19 NOTE — Therapy (Signed)
OUTPATIENT PHYSICAL THERAPY SHOULDER TREATMENT / DISCHARGE NOTE   Patient Name: Bruce Simmons MRN: [id number] DOB:09/25/1975, 47 y.o., male Today's Date: 08/19/2023  END OF SESSION:  PT End of Session - 08/19/23 1157     Visit Number 20    Number of Visits 26    Date for PT Re-Evaluation 08/30/23    Authorization Type cone aetna    Authorization Time Period 26 visits now up until 12/16, so he will need a new auth for after 12/16    Authorization - Visit Number 20    Authorization - Number of Visits 26    Progress Note Due on Visit 26    PT Start Time 0807    PT Stop Time [phone]   patient had to leave early   PT Time Calculation (min) 29 min    Activity Tolerance Patient tolerated treatment well    Behavior During Therapy WFL for tasks assessed/performed                      Past Medical History:  Diagnosis Date   Anxiety    GERD (gastroesophageal reflux disease)    Seasonal allergies    Sleep apnea    Sleep-related bruxism    Past Surgical History:  Procedure Laterality Date   SHOULDER ARTHROSCOPY WITH ROTATOR CUFF REPAIR AND SUBACROMIAL DECOMPRESSION Left 04/23/2023   Procedure: SHOULDER ARTHROSCOPY WITH ROTATOR CUFF DEBRIDEMENT VERSUS REPAIR  AND SUBACROMIAL DECOMPRESSION, OPEN BICEPS TENODESIS;  Surgeon: Jones Broom, MD;  Location: [facility];  Service: Orthopedics;  Laterality: Left;   Patient Active Problem List   Diagnosis Date Noted   OSA (obstructive sleep apnea) 11/11/2013    PCP: Rebecka Apley, NP  REFERRING PROVIDER: Jones Broom, MD  REFERRING DIAG: SHOULDER LONG HEAD BICEPS TENODESIS  THERAPY DIAG:  Muscle weakness (generalized)  Cramp and spasm  Other muscle spasm  Acute pain of left shoulder  Abnormal posture  Rationale for Evaluation and Treatment: Rehabilitation  ONSET DATE: 04/23/2023 surgery date, actual injury occurred in April 2024  SUBJECTIVE:                                                                                                                                                                                       SUBJECTIVE STATEMENT: Patient reports he is doing okay today. His shoulder feels sore and achy today. He felt good after last treatment session.  EVAL: Pt initially injured his left shoulder at work in April 2024.  After failed conservative treatment, he had to undergo left shoulder arthroscopic debridement for partial rotator cuff tear, SLAP tear and subacromial bursa with left shoulder  decompression and left shoulder subpectoral biceps tenodesis by Dr Ave Filter on 04/23/2023.  Hand dominance: Right  PERTINENT HISTORY: Left shoulder arthroscopic debridement for partial rotator cuff tear, SLAP tear and subacromial bursa with left shoulder decompression and left shoulder subpectoral biceps tenodesis by Dr Ave Filter on 04/23/2023 GERD, sleep apnea with CPAP  PAIN: 08/19/2023 Are you having pain? Yes: NPRS scale:  0/10 Pain location: left shoulder Pain description: sore, but if he moves wrong can be a quick/sharp 10/10 pain Aggravating factors: certain movements Relieving factors: rest  PRECAUTIONS: Other: Per MD:  okay for full A/ROM and P/ROM of shoulder, no resisted elbow flexion or supination  RED FLAGS: None   WEIGHT BEARING RESTRICTIONS: No  FALLS:  Has patient fallen in last 6 months? No   OCCUPATION: Works for [employer].  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  NEXT MD VISIT: Dr Ave Filter in  November  OBJECTIVE:   DIAGNOSTIC FINDINGS:  Left shoulder MRI on 03/02/2023 (prior to surgery): IMPRESSION: 1. Moderate tendinosis of the supraspinatus tendon with a small partial-thickness bursal surface tear along the anterior-most aspect and a small insertional interstitial tears of the posterior most aspect. 2. Severe tendinosis of the infraspinatus tendon. 3. Mild tendinosis of the intra-articular portion of the long head of the biceps tendon.  PATIENT SURVEYS:  FOTO 46 (projected 62 by visit 19)  06/25/23:   FOTO 57 (projected 62 by visit 19)  07/09/23:   FOTO 57.3  (projected 62 by visit 19)  08/19/2023   FOTO: 65  COGNITION: Overall cognitive status: Within functional limits for tasks assessed     SENSATION: Denies numbness or tingling  POSTURE: Rounded shoulders, forward head  UPPER EXTREMITY ROM:   Active ROM Right eval Left eval Left 05/27/23 Left 06/25/23 Left 07/09/23  Shoulder flexion 170 63 148 seated/145 supine  148 active 155 passive WFL  Shoulder extension 50 35 43 na WFL  Shoulder abduction 165 56 62 seated/88 supine 158 active 170 passive WFL  Shoulder adduction       Shoulder internal rotation    68 active 70 passive WFL  Shoulder external rotation    73 active 80 passive WFL  (Blank rows = not tested)  UPPER EXTREMITY MMT:  Eval:   Right shoulder is WFL Left shoulder is at least 3- to 3/5  07/09/23: Left shoulder: flexion: 4-/5, abduction 4/5, IR 4+/5, ER 4/5  (pain with ER and abduction)    TODAY'S TREATMENT:     LEFT SHOULDER  DATE:  08/19/23 UBE L 4.5 x 6 min- PT present to discuss status Lat Pull Down 60# 2 x 15  Seated Matrix Row 65# 2 x 10 Low Row at cable column 40# 2 x 10 FOTO:65 Bent Over Row with barbell 90# 2 x 10 Simulated Raising stretcher with 30# barbell 2 x 10 Push/ Pull with Box  on elevated plinth 30# x 20    08/13/23 UBE L 4.5 x 6 min Lat Pull Down 60# 2 x 15  Seated Matrix Row 45# 2 x 10 Low Row at cable column 40# 2 x 10 Bent Over Row with barbell 90# 2 x 10 Simulated Raising stretcher with 30# barbell 2 x 10 Push/ Pull with Box on elevated plinth 30# x 20 Prone shoulder ext 2 x 10 with 5# left Prone shoulder row  2 x 10 with 5# left Prone shoulder horizontal abduction 2 x 10 5# left Side lying ER with 3# 1 x 10  Standing shoulder IR stretch with towel x 10 Standing ER/IR green band B x 20 Single Arm Bent Row 20# KB 2 x 10 Box Pushes with 25# x 3 laps   08/06/23 UBE L 4.5 x 6 min Lat Pull Down 45# 2 x 10  Seated Matrix Row 40# 2 x 10 Weighted pulls at cable column 40# x 10 Simulated Raising stretcher - using power bands Pushing using medium magenta serious steel power band x 20 Prone shoulder ext 2 x 10 with 5# left Prone shoulder row  2 x 10 with 5# left Prone shoulder horizontal abduction 2 x 10 5# left Side lying ER with 3# 1 x 10 with 3 sec hold at top and eccentric lower - burning in ant shoulder Supine rhythmic stab ER/IR in 90 deg elbow flexion and shoulder ABD - some pain with ER. Standing ER/IR Red band B x 20 Iontophoresis with 1.0 mL 4mg /ml Dexamethasone - 4-6 hour patch (24mA-min) to L ant shoulder (patch #1 of 6 - this round)    PATIENT EDUCATION: Education details: Issued HEP Person educated: Patient Education method: Explanation, Demonstration, and Handouts Education comprehension: verbalized understanding and returned demonstration  HOME EXERCISE PROGRAM: Access Code: [email or web address] URL: [email or web address] Date: 05/15/2023 Prepared by: Clydie Braun Menke  Exercises - Seated Scapular Retraction  - 1 x daily - 7 x weekly - 2 sets - 10 reps - Circular Shoulder Pendulum with Table Support  - 1 x daily - 7 x weekly - 2 sets - 10 reps - Seated Shoulder Abduction Towel Slide at Table Top  - 1 x daily - 7 x weekly - 2  sets - 10 reps - Seated Shoulder Flexion Towel Slide at Table Top  - 1 x daily - 7 x weekly - 2 sets - 10 reps - Shoulder Flexion Wall Slide with Towel  - 1 x daily - 7 x weekly - 2 sets - 10 reps - Standing Shoulder Abduction Slides at Wall  - 1 x daily - 7 x weekly - 2 sets - 10 reps  ASSESSMENT:  CLINICAL IMPRESSION: Bruce Simmons has made good progress with physical therapy. He has been able to do work simulated activities in the clinic with no shoulder pain. Patient has a follow up appointment with his doctor today. He has not returned back to work yet, but he is close to his return to work date. Bruce Simmons has met all of his goals at this time and demonstrates independence with his HEP. Patient to discharge with home.  OBJECTIVE IMPAIRMENTS: decreased ROM, decreased strength, increased muscle spasms, impaired flexibility, postural dysfunction, and pain.   ACTIVITY LIMITATIONS: carrying, lifting, sleeping, reach over head, and caring for others  PARTICIPATION LIMITATIONS: community activity, occupation, and yard work  PERSONAL FACTORS: Profession and Time since onset of injury/illness/exacerbation are also affecting patient's functional outcome.   REHAB POTENTIAL: Good  CLINICAL DECISION MAKING: Stable/uncomplicated  EVALUATION COMPLEXITY: Low   GOALS: Goals reviewed with patient? Yes  SHORT TERM GOALS: Target date: 05/31/2023  Pt will be independent with initial HEP. Baseline: Goal status: MET  2.  Patient will increase left shoulder flexion and abduction A/ROM by 10 degrees to allow patient to reach into shelves. Baseline:  Goal status: MET 06/12/23   LONG TERM GOALS: Target date: 08/23/23  Patient to be independent with advanced HEP to allow for self progression post discharge. Baseline:  Goal status: MET 08/19/2023  2.  Patient to increase left shoulder A/ROM to [facility] to allow him to reach overhead for job related tasks. Baseline: see above Goal status: MET 06/05/23  3.   Patient to increase left shoulder strength to at least 4+/5 to allow him to push/pull items as needed at work and to assist with transfers. Baseline: 3-/5 Goal status: MET 08/19/2023  4.  Patient to increase FOTO to at least 62 to demonstrate improvements in functional mobility. Baseline:  Goal status: MET 08/19/2023  5.  Patient able to demonstrate job simulated tasks in PT clinic without increased pain or to report ability to return to full duty at work without increased shoulder pain. Baseline:  Goal status: MET 08/19/2023    PLAN:  PT FREQUENCY: 1-2x/week  PT DURATION: 8 weeks  PLANNED INTERVENTIONS: Therapeutic exercises, Therapeutic activity, Neuromuscular re-education, Balance training, Gait training, Patient/Family education, Self Care, Joint mobilization, Joint manipulation, Aquatic Therapy, Dry Needling, Electrical stimulation, Spinal manipulation, Spinal mobilization, Cryotherapy, Moist heat, scar mobilization, Taping, Vasopneumatic device, Ultrasound, Ionotophoresis 4mg /ml Dexamethasone, Manual therapy, and Re-evaluation  PLAN FOR NEXT SESSION:  Patient to discharge home with HEP  PHYSICAL THERAPY DISCHARGE SUMMARY  Visits from Start of Care: 20  Current functional level related to goals / functional outcomes: Bruce Simmons has met all therapy goals at this time   Remaining deficits: Patient has not returned to work yet, but has not had any 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 Patient goals were met. Patient is being discharged due to meeting the stated rehab goals.  Claude Manges, PT 08/19/23 11:58 AM [facility] [address] [place], [place] [address] Phone # [phone] Fax [phone]

## 2023-08-21 ENCOUNTER — Other Ambulatory Visit (HOSPITAL_COMMUNITY): Payer: Self-pay

## 2023-08-21 MED ORDER — PHENTERMINE HCL 37.5 MG PO TABS
37.5000 mg | ORAL_TABLET | Freq: Every morning | ORAL | 0 refills | Status: AC
Start: 2023-08-21 — End: ?
  Filled 2023-08-21 – 2023-08-22 (×2): qty 30, 30d supply, fill #0

## 2023-08-22 ENCOUNTER — Other Ambulatory Visit (HOSPITAL_COMMUNITY): Payer: Self-pay

## 2023-10-22 DIAGNOSIS — G4733 Obstructive sleep apnea (adult) (pediatric): Secondary | ICD-10-CM | POA: Diagnosis not present

## 2024-01-20 DIAGNOSIS — G4733 Obstructive sleep apnea (adult) (pediatric): Secondary | ICD-10-CM | POA: Diagnosis not present

## 2024-04-07 ENCOUNTER — Encounter (HOSPITAL_COMMUNITY): Payer: Self-pay

## 2024-04-07 ENCOUNTER — Other Ambulatory Visit (HOSPITAL_COMMUNITY): Payer: Self-pay

## 2024-04-19 DIAGNOSIS — G4733 Obstructive sleep apnea (adult) (pediatric): Secondary | ICD-10-CM | POA: Diagnosis not present

## 2024-08-11 ENCOUNTER — Other Ambulatory Visit (HOSPITAL_COMMUNITY): Payer: Self-pay

## 2024-08-12 ENCOUNTER — Other Ambulatory Visit (HOSPITAL_COMMUNITY): Payer: Self-pay

## 2024-08-12 MED ORDER — ESCITALOPRAM OXALATE 5 MG PO TABS
5.0000 mg | ORAL_TABLET | Freq: Every day | ORAL | 4 refills | Status: AC
  Filled 2024-08-12: qty 90, 90d supply, fill #0

## 2024-08-18 ENCOUNTER — Other Ambulatory Visit (HOSPITAL_COMMUNITY): Payer: Self-pay

## 2024-08-19 ENCOUNTER — Other Ambulatory Visit: Payer: Self-pay

## 2024-08-19 ENCOUNTER — Encounter: Payer: Self-pay | Admitting: Orthopedic Surgery

## 2024-08-19 ENCOUNTER — Ambulatory Visit: Admitting: Orthopedic Surgery

## 2024-08-19 VITALS — BP 148/98 | HR 79 | Ht 66.0 in | Wt 270.0 lb

## 2024-08-19 DIAGNOSIS — R2231 Localized swelling, mass and lump, right upper limb: Secondary | ICD-10-CM | POA: Diagnosis not present

## 2024-08-19 DIAGNOSIS — M79644 Pain in right finger(s): Secondary | ICD-10-CM

## 2024-08-19 DIAGNOSIS — Z89021 Acquired absence of right finger(s): Secondary | ICD-10-CM

## 2024-08-19 NOTE — Progress Notes (Unsigned)
 New Patient Visit  Summary: Bruce Simmons is a 48 y.o. male with the following: 1. Pain in right index finger  Assessment & Plan Right index finger mass and pain, post-traumatic, post partial amputation Chronic mass and pain post-trauma, differential includes neuroma, cyst, or soft tissue mass. X-ray negative for bony issues, soft tissue shadowing noted. MRI declined due to cost. - Contacted Dr. Bethanne, hand specialist, for recommendations. - MRI considered if he opts for further evaluation.     Follow-up: No follow-ups on file.  Subjective:  Chief Complaint  Patient presents with   Hand Pain    Injury / 30 years ago cut tip of finger off right index on meat slicer /now feels like its got something in it and it is very tender      Discussed the use of AI scribe software for clinical note transcription with the patient, who gave verbal consent to proceed.  History of Present Illness Bruce Simmons is a 48 year old male who presents with pain and swelling in the right index finger.  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 About one year later he developed joint pain and underwent a bone-trimming procedure at that joint.  He now has a firm, tender growth on the side of the right index finger that throbs after heavy use and causes shooting pain when bumped. It feels like something is there, is sensitive to touch, and has been steadily increasing in size compared with the prior appearance of the finger.  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 As he is right-handed, symptoms in the right index finger irritate him and interfere with daily activities.  He previously had a fluid-filled cyst on the same joint that resolved spontaneously and has not recurred. He had not obtained prior imaging of this finger before today's visit.    Review of  Systems: No fevers or chills*** No numbness or tingling No chest pain No shortness of breath No bowel or bladder dysfunction No GI distress No headaches   Medical History:  Past Medical History:  Diagnosis Date   Anxiety    GERD (gastroesophageal reflux disease)    Seasonal allergies    Sleep apnea    Sleep-related bruxism     Past Surgical History:  Procedure Laterality Date   SHOULDER ARTHROSCOPY WITH ROTATOR CUFF REPAIR AND SUBACROMIAL DECOMPRESSION Left 04/23/2023   Procedure: SHOULDER ARTHROSCOPY WITH ROTATOR CUFF DEBRIDEMENT VERSUS REPAIR  AND SUBACROMIAL DECOMPRESSION, OPEN BICEPS TENODESIS;  Surgeon: Bruce Soulier, MD;  Location: [facility];  Service: Orthopedics;  Laterality: Left;    Family History  Problem Relation Age of Onset   Diabetes Father    Hypertension Father    Hyperlipidemia Father    Atrial fibrillation Father    Stroke Mother    Hypertension Mother    Thyroid  cancer Mother    Social History[1]  Allergies[2]  Active Medications[3]  Objective: BP (!) 148/98   Pulse 79   Ht 5' 6 (1.676 m)   Wt 270 lb (122.5 kg)   BMI 43.58 kg/m   Physical Exam:    General: {General PE Findings:25791} Gait: {Gait:25792}  Physical Exam MUSCULOSKELETAL: Firm mass on right index finger. Soft tissue swelling on right index finger joint.   IMAGING: {XR Reviewed:24899}     New Medications:  No orders of the  defined types were placed in this encounter.     Portions of this note were completed via Scientist, clinical (histocompatibility and immunogenetics).  Bruce DELENA Horde, MD  08/19/2024 2:46 PM      [1]  Social History Tobacco Use   Smoking status: Former    Current packs/day: 0.00    Average packs/day: 0.3 packs/day for 3.0 years (0.8 ttl pk-yrs)    Types: Cigarettes    Start date: 09/03/2000    Quit date: 09/04/2003    Years since quitting: 20.9  Substance Use Topics   Alcohol use: Yes    Comment: 12 pack/week   Drug use: Never  [2]   Allergies Allergen Reactions   Penicillins Anaphylaxis   Zofran  [Ondansetron  Hcl]     Swelling of lips and tongue and rash  [3]  No outpatient medications have been marked as taking for the 08/19/24 encounter (Office Visit) with [name], MD.

## 2024-08-21 ENCOUNTER — Other Ambulatory Visit (HOSPITAL_COMMUNITY): Payer: Self-pay

## 2024-10-06 ENCOUNTER — Telehealth: Payer: Self-pay

## 2024-10-06 NOTE — Telephone Encounter (Signed)
 Called Bruce Simmons with adapt,patients machine is really really old and motem probably quit and stopped transmitting,there is data showing from last year but nothing since,most likely will need new machine

## 2024-10-07 ENCOUNTER — Encounter: Payer: Self-pay | Admitting: Primary Care

## 2024-10-07 ENCOUNTER — Ambulatory Visit: Admitting: Primary Care

## 2024-10-07 VITALS — BP 136/72 | HR 87 | Temp 97.8°F | Ht 66.0 in | Wt 275.0 lb

## 2024-10-07 DIAGNOSIS — E669 Obesity, unspecified: Secondary | ICD-10-CM | POA: Diagnosis not present

## 2024-10-07 DIAGNOSIS — Z87891 Personal history of nicotine dependence: Secondary | ICD-10-CM

## 2024-10-07 DIAGNOSIS — Z6841 Body Mass Index (BMI) 40.0 and over, adult: Secondary | ICD-10-CM

## 2024-10-07 DIAGNOSIS — G4733 Obstructive sleep apnea (adult) (pediatric): Secondary | ICD-10-CM | POA: Diagnosis not present

## 2024-10-07 MED ORDER — ZEPBOUND 2.5 MG/0.5ML ~~LOC~~ SOAJ: 2.5000 mg | SUBCUTANEOUS | Status: AC

## 2024-10-07 NOTE — Patient Instructions (Signed)
" °  VISIT SUMMARY: During your visit, we discussed your worsening sleep apnea symptoms and the challenges you are facing with your current CPAP machine. We also addressed your concerns about weight management and its impact on your sleep apnea.  YOUR PLAN: -SEVERE OBSTRUCTIVE SLEEP APNEA: Severe obstructive sleep apnea is a condition where your airway becomes blocked during sleep, causing breathing interruptions. Your current CPAP machine may be failing, and we are considering a BiPAP machine due to your high pressure needs. We have ordered a split night sleep study to confirm the diagnosis and assess the need for BiPAP. This process may take 6-8 weeks for insurance approval and scheduling. In the meantime, we provided samples of Zepbound  to assist with weight loss, which may help improve your sleep apnea symptoms. Be aware of potential side effects such as nausea, vomiting, diarrhea, and constipation. You should also hold Zepbound  one week prior to any surgical procedures due to the risk of aspiration.  -MORBID OBESITY: Morbid obesity is a condition where your body mass index (BMI) is 40 or higher, which can worsen sleep apnea symptoms. We discussed the challenges with weight loss due to insurance limitations on certain medications. We provided samples of Zepbound  2.5 mg once weekly to help with weight loss and advised on dietary modifications to manage side effects. Using GoodRx may help reduce medication costs. Losing weight can potentially improve your sleep apnea symptoms.  INSTRUCTIONS: Please follow up after your split night sleep study to discuss the results and next steps. Continue using your CPAP machine as best as you can until then. If you experience any severe side effects from Zepbound , please contact our office immediately.  Orders: Split night sleep study  RX: Samples Zepbound  2.5mg  [place] weekly (2 boxes)  Follow-up 3 months with Landry NP   Contains text generated by [employer]bridge.     Notify your provider if you are planning to have a procedure/surgery, as this medication will need to be stopped prior.     "

## 2024-10-07 NOTE — Progress Notes (Signed)
 "  @Patient  ID: Bruce Simmons, male    DOB: [birth year], 49 y.o.   MRN: [id number]  Chief Complaint  Patient presents with   Consult    Uses CPAP. 10 years or more. Sleep study in 2003 at [facility] by Dr Corrie but unable to find test     Referring provider: Baird Comer GAILS, NP  HPI: 49 year old former smoker. PMH significant for OSA.   TESTS: PSG 2008 >> AHI 110, SpO2 low 51%  10/07/2024- Sleep consult  Discussed the use of AI scribe software for clinical note transcription with the patient, who gave verbal consent to proceed.  History of Present Illness Bruce Simmons is a 49 year old male with severe sleep apnea who presents for a sleep consult due to worsening symptoms.  Diagnosed with severe sleep apnea and has been using a CPAP machine since approximately 2008. The machine, which is about 28 to 50 years old, was effective until the last 6 to 8 months. Recently, he has been experiencing 30 to 50 apnea events per hour despite using the machine at maximum pressure settings. He has tried using his wife's newer CPAP machine, but it has not improved his symptoms.  He describes waking up with his heart 'coming out of his chest' and struggling to breathe, often needing to sit up to sleep. He has been sleeping on the couch in an upright position to manage his symptoms.   He has attempted weight loss medications in the past, including phentermine , but experienced undesirable side effects. He is not currently on any weight loss medication due to insurance coverage issues, as his insurance does not cover weight loss medications unless he is diabetic. Failed traditional weight loss with diet and exercise. He has a BMI of 44 and is concerned about the impact of his weight on his sleep apnea.   Allergies[1]  Immunization History  Administered Date(s) Administered   Influenza-Unspecified 06/03/2014   PFIZER(Purple Top)SARS-COV-2 Vaccination 08/24/2019, 09/11/2019    Past  Medical History:  Diagnosis Date   Anxiety    GERD (gastroesophageal reflux disease)    Seasonal allergies    Sleep apnea    Sleep-related bruxism     Tobacco History: Tobacco Use History[2] Counseling given: Not Answered   Outpatient Medications Prior to Visit  Medication Sig Dispense Refill   escitalopram  (LEXAPRO ) 5 MG tablet Take 1 tablet (5 mg total) by mouth daily. 90 tablet 4   tadalafil  (CIALIS ) 5 MG tablet Take 1 tablet by mouth as needed an hour before sex. Do not take more than 1 time in 48 hours. This is not a daily medicine. 30 tablet 1   COVID-19 At Home Antigen Test (CARESTART COVID-19 HOME TEST) KIT Use as directed (Patient not taking: Reported on 10/07/2024) 4 each 0   escitalopram  (LEXAPRO ) 5 MG tablet Take 1 tablet (5 mg total) by mouth daily for 7 days, THEN 2 tablets (10 mg total) daily. (Patient not taking: No sig reported) 60 tablet 4   escitalopram  (LEXAPRO ) 5 MG tablet Take 1 tablet (5 mg total) by mouth daily. (Patient not taking: Reported on 10/07/2024) 90 tablet 4   phentermine  (ADIPEX-P ) 37.5 MG tablet Take 1/2 tablet by mouth every morning for 1 week then take 1 tablet every morning thereafter. (Patient not taking: Reported on 10/07/2024) 30 tablet 0   Semaglutide -Weight Management (WEGOVY ) 0.25 MG/0.5ML SOAJ Inject 0.25 mg into the skin once a week. (Patient not taking: Reported on 10/07/2024) 2 mL  1   No facility-administered medications prior to visit.   Review of Systems  Review of Systems  Constitutional: Negative.   HENT: Negative.    Respiratory: Negative.    Cardiovascular: Negative.   Psychiatric/Behavioral:  Positive for sleep disturbance.    Physical Exam  BP 136/72   Pulse 87   Temp 97.8 F (36.6 C)   Ht 5' 6 (1.676 m) Comment: pt stated  Wt 275 lb (124.7 kg)   SpO2 97% Comment: ra  BMI 44.39 kg/m  Physical Exam Constitutional:      Appearance: Normal appearance. He is well-developed.  HENT:     Head: Normocephalic and atraumatic.      Mouth/Throat:     Mouth: Mucous membranes are moist.     Pharynx: Oropharynx is clear.  Cardiovascular:     Rate and Rhythm: Normal rate and regular rhythm.     Heart sounds: Normal heart sounds.  Pulmonary:     Effort: Pulmonary effort is normal. No respiratory distress.     Breath sounds: Normal breath sounds. No wheezing or rhonchi.  Musculoskeletal:        General: Normal range of motion.     Cervical back: Normal range of motion and neck supple.  Skin:    General: Skin is warm and dry.     Findings: No erythema or rash.  Neurological:     General: No focal deficit present.     Mental Status: He is alert and oriented to person, place, and time. Mental status is at baseline.  Psychiatric:        Mood and Affect: Mood normal.        Behavior: Behavior normal.        Thought Content: Thought content normal.        Judgment: Judgment normal.     Lab Results:  CBC No results found for: WBC, RBC, HGB, HCT, PLT, MCV, MCH, MCHC, RDW, LYMPHSABS, MONOABS, EOSABS, BASOSABS  BMET No results found for: NA, K, CL, CO2, GLUCOSE, BUN, CREATININE, CALCIUM, GFRNONAA, GFRAA  BNP No results found for: BNP  ProBNP No results found for: PROBNP  Imaging: No results found.   Assessment & Plan:   1. OSA (obstructive sleep apnea) (Primary) - Split night study; Future  2. Obesity (BMI 35.0-39.9 without comorbidity)  Assessment and Plan Assessment & Plan Severe obstructive sleep apnea Hx severe sleep apnea dx in 2008, sleep study in not in EMR. Currenty using auto CPAP with meadian pressure 19cm h20.  Residual apneas on auto CPAP, median pressure 19cm h20 with residual apnea 39 events per hour. Previous sleep study showed over 100 events per hour. Symptoms include waking up with heart palpitations and difficulty breathing. Considering BiPAP due to high pressure needs and residual apnea. Discussed split night sleep study to confirm  diagnosis and assess need for BiPAP.  - Ordered split night sleep study to confirm diagnosis and assess need for BiPAP. - Provided samples of Zepbound  to assist with weight loss, pending sleep study results. - Advised on potential side effects of Zepbound , including nausea, vomiting, diarrhea, and constipation.  Morbid obesity BMI of 44. Discussed challenges with weight loss due to insurance limitations on GLP-1 medications. Previous trial of phentermine  was not well tolerated. Failed traditional weight loss methods including diet and exercise for >6 months. Considering Zepbound  as a potential option to jumpstart weight loss, with the understanding that insurance coverage is uncertain. Discussed the potential for weight loss to improve sleep apnea symptoms. No  history of diabetes or personal history of medullary thyroid  cancer.  - Provided two month supply Zepbound  2.5 mg once weekly to assist with weight loss. - Advised on dietary modifications to manage side effects of Zepbound  - Discussed the potential for using GoodRx or self pay options - Encouraged consideration of weight loss as a means to improve sleep apnea symptoms.  Patient has moderate to severe  sleep apnea related to obesity with BMI >/30 or is overweight with BMI >/27, posing significant cardiovascular risks. Patient has failed traditional weight loss measures with caloric deficit and consistent exercise of 150 min/week for >/6 months. Patient will be initiated on Zepbound  (tirzepatide ) for weight management. Zepbound  is the only pharmaceutical treatment approved for moderate-to-severe OSA in adults who are overweight (BMI >/27) or obese (BMI >/30). The patient will continue lifestyle modifications, including structured nutrition and physical activity as directed. No other GLP1 therapy will be used simultaneously at this time. The patient does not have any FDA labeled contraindications to this agent, including pregnancy, lactation, hx or  family history of medullary thyroid  cancer, or multiple endocrine neoplasia type II. Side effect profile has been reviewed with patient. Aware of red flag symptoms to notify of immediately or seek emergency care, including severe nausea/vomiting, inability to pass bowels or gas, severe abdominal pain/tenderness, jaundice.    Recording duration: 19 minutes    Almarie LELON Ferrari, NP 10/07/2024      [1]  Allergies Allergen Reactions   Penicillins Anaphylaxis   Zofran  [Ondansetron  Hcl]     Swelling of lips and tongue and rash  [2]  Social History Tobacco Use  Smoking Status Former   Current packs/day: 0.00   Average packs/day: 0.3 packs/day for 3.0 years (0.8 ttl pk-yrs)   Types: Cigarettes   Start date: 09/03/2000   Quit date: 09/04/2003   Years since quitting: 21.1  Smokeless Tobacco Not on file   "

## 2024-10-08 ENCOUNTER — Ambulatory Visit (HOSPITAL_BASED_OUTPATIENT_CLINIC_OR_DEPARTMENT_OTHER): Admitting: Pulmonary Disease

## 2024-11-11 ENCOUNTER — Ambulatory Visit: Admitting: Primary Care

## 2024-12-09 ENCOUNTER — Ambulatory Visit: Admitting: Primary Care

## 2024-12-16 ENCOUNTER — Ambulatory Visit (HOSPITAL_BASED_OUTPATIENT_CLINIC_OR_DEPARTMENT_OTHER): Admitting: Pulmonary Disease
